# Patient Record
Sex: Female | Born: 1975 | Race: Black or African American | Hispanic: No | Marital: Married | Smoking: Current some day smoker
Health system: Southern US, Community
[De-identification: ages and names within clinical notes are randomized; demographics above are authoritative.]

## PROBLEM LIST (undated history)

## (undated) DIAGNOSIS — F39 Unspecified mood [affective] disorder: Secondary | ICD-10-CM

## (undated) DIAGNOSIS — I10 Essential (primary) hypertension: Secondary | ICD-10-CM

## (undated) DIAGNOSIS — F32A Depression, unspecified: Secondary | ICD-10-CM

## (undated) DIAGNOSIS — F329 Major depressive disorder, single episode, unspecified: Secondary | ICD-10-CM

## (undated) DIAGNOSIS — S7290XA Unspecified fracture of unspecified femur, initial encounter for closed fracture: Secondary | ICD-10-CM

## (undated) DIAGNOSIS — G43909 Migraine, unspecified, not intractable, without status migrainosus: Secondary | ICD-10-CM

## (undated) HISTORY — PX: CHOLECYSTECTOMY: SHX55

## (undated) HISTORY — PX: APPENDECTOMY: SHX54

## (undated) HISTORY — PX: TUBAL LIGATION: SHX77

---

## 2004-09-07 DIAGNOSIS — S7290XA Unspecified fracture of unspecified femur, initial encounter for closed fracture: Secondary | ICD-10-CM | POA: Insufficient documentation

## 2004-09-07 HISTORY — DX: Unspecified fracture of unspecified femur, initial encounter for closed fracture: S72.90XA

## 2004-09-07 HISTORY — PX: FEMUR FRACTURE SURGERY: SHX633

## 2013-02-28 DIAGNOSIS — F39 Unspecified mood [affective] disorder: Secondary | ICD-10-CM | POA: Diagnosis not present

## 2013-03-09 DIAGNOSIS — I10 Essential (primary) hypertension: Secondary | ICD-10-CM | POA: Diagnosis not present

## 2013-03-09 DIAGNOSIS — Z01419 Encounter for gynecological examination (general) (routine) without abnormal findings: Secondary | ICD-10-CM | POA: Diagnosis not present

## 2013-03-09 DIAGNOSIS — F319 Bipolar disorder, unspecified: Secondary | ICD-10-CM | POA: Diagnosis not present

## 2013-03-09 DIAGNOSIS — Z0000 Encounter for general adult medical examination without abnormal findings: Secondary | ICD-10-CM | POA: Diagnosis not present

## 2013-03-09 DIAGNOSIS — M79609 Pain in unspecified limb: Secondary | ICD-10-CM | POA: Diagnosis not present

## 2013-04-05 DIAGNOSIS — Z5181 Encounter for therapeutic drug level monitoring: Secondary | ICD-10-CM | POA: Diagnosis not present

## 2013-04-05 DIAGNOSIS — M79609 Pain in unspecified limb: Secondary | ICD-10-CM | POA: Diagnosis not present

## 2013-04-05 DIAGNOSIS — M545 Low back pain: Secondary | ICD-10-CM | POA: Diagnosis not present

## 2013-04-12 DIAGNOSIS — F329 Major depressive disorder, single episode, unspecified: Secondary | ICD-10-CM | POA: Diagnosis not present

## 2013-04-12 DIAGNOSIS — L259 Unspecified contact dermatitis, unspecified cause: Secondary | ICD-10-CM | POA: Diagnosis not present

## 2013-04-12 DIAGNOSIS — R630 Anorexia: Secondary | ICD-10-CM | POA: Diagnosis not present

## 2013-05-04 DIAGNOSIS — M545 Low back pain: Secondary | ICD-10-CM | POA: Diagnosis not present

## 2013-05-04 DIAGNOSIS — Z5181 Encounter for therapeutic drug level monitoring: Secondary | ICD-10-CM | POA: Diagnosis not present

## 2013-07-12 DIAGNOSIS — I10 Essential (primary) hypertension: Secondary | ICD-10-CM | POA: Diagnosis not present

## 2013-07-12 DIAGNOSIS — B373 Candidiasis of vulva and vagina: Secondary | ICD-10-CM | POA: Diagnosis not present

## 2013-07-12 DIAGNOSIS — N390 Urinary tract infection, site not specified: Secondary | ICD-10-CM | POA: Diagnosis not present

## 2013-07-12 DIAGNOSIS — N3000 Acute cystitis without hematuria: Secondary | ICD-10-CM | POA: Diagnosis not present

## 2013-09-13 DIAGNOSIS — F411 Generalized anxiety disorder: Secondary | ICD-10-CM | POA: Diagnosis not present

## 2013-09-13 DIAGNOSIS — R0789 Other chest pain: Secondary | ICD-10-CM | POA: Diagnosis not present

## 2013-09-13 DIAGNOSIS — I208 Other forms of angina pectoris: Secondary | ICD-10-CM | POA: Diagnosis not present

## 2013-09-13 DIAGNOSIS — R079 Chest pain, unspecified: Secondary | ICD-10-CM | POA: Diagnosis not present

## 2013-09-13 DIAGNOSIS — R0602 Shortness of breath: Secondary | ICD-10-CM | POA: Diagnosis not present

## 2013-10-16 DIAGNOSIS — F17200 Nicotine dependence, unspecified, uncomplicated: Secondary | ICD-10-CM | POA: Diagnosis not present

## 2013-10-16 DIAGNOSIS — G43809 Other migraine, not intractable, without status migrainosus: Secondary | ICD-10-CM | POA: Diagnosis not present

## 2013-10-16 DIAGNOSIS — R0989 Other specified symptoms and signs involving the circulatory and respiratory systems: Secondary | ICD-10-CM | POA: Diagnosis not present

## 2013-10-16 DIAGNOSIS — I10 Essential (primary) hypertension: Secondary | ICD-10-CM | POA: Diagnosis not present

## 2013-10-16 DIAGNOSIS — R0609 Other forms of dyspnea: Secondary | ICD-10-CM | POA: Diagnosis not present

## 2013-10-16 DIAGNOSIS — E559 Vitamin D deficiency, unspecified: Secondary | ICD-10-CM | POA: Diagnosis not present

## 2013-10-16 DIAGNOSIS — F341 Dysthymic disorder: Secondary | ICD-10-CM | POA: Diagnosis not present

## 2013-10-17 DIAGNOSIS — F4310 Post-traumatic stress disorder, unspecified: Secondary | ICD-10-CM | POA: Diagnosis not present

## 2013-10-26 DIAGNOSIS — G4710 Hypersomnia, unspecified: Secondary | ICD-10-CM | POA: Diagnosis not present

## 2013-10-26 DIAGNOSIS — G479 Sleep disorder, unspecified: Secondary | ICD-10-CM | POA: Diagnosis not present

## 2013-10-27 DIAGNOSIS — F17200 Nicotine dependence, unspecified, uncomplicated: Secondary | ICD-10-CM | POA: Diagnosis not present

## 2013-10-27 DIAGNOSIS — I10 Essential (primary) hypertension: Secondary | ICD-10-CM | POA: Diagnosis not present

## 2013-10-27 DIAGNOSIS — E876 Hypokalemia: Secondary | ICD-10-CM | POA: Diagnosis not present

## 2013-10-27 DIAGNOSIS — W1809XA Striking against other object with subsequent fall, initial encounter: Secondary | ICD-10-CM | POA: Diagnosis not present

## 2013-10-27 DIAGNOSIS — S46909A Unspecified injury of unspecified muscle, fascia and tendon at shoulder and upper arm level, unspecified arm, initial encounter: Secondary | ICD-10-CM | POA: Diagnosis not present

## 2013-10-27 DIAGNOSIS — F341 Dysthymic disorder: Secondary | ICD-10-CM | POA: Diagnosis not present

## 2013-10-27 DIAGNOSIS — S4980XA Other specified injuries of shoulder and upper arm, unspecified arm, initial encounter: Secondary | ICD-10-CM | POA: Diagnosis not present

## 2013-10-27 DIAGNOSIS — G43809 Other migraine, not intractable, without status migrainosus: Secondary | ICD-10-CM | POA: Diagnosis not present

## 2013-10-27 DIAGNOSIS — M25519 Pain in unspecified shoulder: Secondary | ICD-10-CM | POA: Diagnosis not present

## 2013-11-06 DIAGNOSIS — F341 Dysthymic disorder: Secondary | ICD-10-CM | POA: Diagnosis not present

## 2013-11-06 DIAGNOSIS — R0609 Other forms of dyspnea: Secondary | ICD-10-CM | POA: Diagnosis not present

## 2013-11-06 DIAGNOSIS — M25519 Pain in unspecified shoulder: Secondary | ICD-10-CM | POA: Diagnosis not present

## 2013-11-06 DIAGNOSIS — E876 Hypokalemia: Secondary | ICD-10-CM | POA: Diagnosis not present

## 2013-11-06 DIAGNOSIS — F17200 Nicotine dependence, unspecified, uncomplicated: Secondary | ICD-10-CM | POA: Diagnosis not present

## 2013-11-06 DIAGNOSIS — G43809 Other migraine, not intractable, without status migrainosus: Secondary | ICD-10-CM | POA: Diagnosis not present

## 2013-11-06 DIAGNOSIS — I10 Essential (primary) hypertension: Secondary | ICD-10-CM | POA: Diagnosis not present

## 2013-11-07 DIAGNOSIS — F4310 Post-traumatic stress disorder, unspecified: Secondary | ICD-10-CM | POA: Diagnosis not present

## 2013-11-11 DIAGNOSIS — F4310 Post-traumatic stress disorder, unspecified: Secondary | ICD-10-CM | POA: Diagnosis not present

## 2013-11-20 DIAGNOSIS — F4310 Post-traumatic stress disorder, unspecified: Secondary | ICD-10-CM | POA: Diagnosis not present

## 2013-11-27 DIAGNOSIS — F4310 Post-traumatic stress disorder, unspecified: Secondary | ICD-10-CM | POA: Diagnosis not present

## 2013-12-04 DIAGNOSIS — F4310 Post-traumatic stress disorder, unspecified: Secondary | ICD-10-CM | POA: Diagnosis not present

## 2013-12-15 DIAGNOSIS — F17200 Nicotine dependence, unspecified, uncomplicated: Secondary | ICD-10-CM | POA: Diagnosis not present

## 2013-12-15 DIAGNOSIS — R0609 Other forms of dyspnea: Secondary | ICD-10-CM | POA: Diagnosis not present

## 2013-12-15 DIAGNOSIS — R0989 Other specified symptoms and signs involving the circulatory and respiratory systems: Secondary | ICD-10-CM | POA: Diagnosis not present

## 2013-12-15 DIAGNOSIS — M25519 Pain in unspecified shoulder: Secondary | ICD-10-CM | POA: Diagnosis not present

## 2013-12-15 DIAGNOSIS — J3089 Other allergic rhinitis: Secondary | ICD-10-CM | POA: Diagnosis not present

## 2013-12-15 DIAGNOSIS — G43809 Other migraine, not intractable, without status migrainosus: Secondary | ICD-10-CM | POA: Diagnosis not present

## 2013-12-15 DIAGNOSIS — F341 Dysthymic disorder: Secondary | ICD-10-CM | POA: Diagnosis not present

## 2013-12-15 DIAGNOSIS — I10 Essential (primary) hypertension: Secondary | ICD-10-CM | POA: Diagnosis not present

## 2013-12-15 DIAGNOSIS — E876 Hypokalemia: Secondary | ICD-10-CM | POA: Diagnosis not present

## 2013-12-22 DIAGNOSIS — F4310 Post-traumatic stress disorder, unspecified: Secondary | ICD-10-CM | POA: Diagnosis not present

## 2014-01-12 DIAGNOSIS — M545 Low back pain, unspecified: Secondary | ICD-10-CM | POA: Diagnosis not present

## 2014-01-12 DIAGNOSIS — M25519 Pain in unspecified shoulder: Secondary | ICD-10-CM | POA: Diagnosis not present

## 2014-01-12 DIAGNOSIS — F17200 Nicotine dependence, unspecified, uncomplicated: Secondary | ICD-10-CM | POA: Diagnosis not present

## 2014-01-12 DIAGNOSIS — M549 Dorsalgia, unspecified: Secondary | ICD-10-CM | POA: Diagnosis not present

## 2014-01-12 DIAGNOSIS — S20219A Contusion of unspecified front wall of thorax, initial encounter: Secondary | ICD-10-CM | POA: Diagnosis not present

## 2014-01-12 DIAGNOSIS — S298XXA Other specified injuries of thorax, initial encounter: Secondary | ICD-10-CM | POA: Diagnosis not present

## 2014-01-12 DIAGNOSIS — I10 Essential (primary) hypertension: Secondary | ICD-10-CM | POA: Diagnosis not present

## 2014-01-25 DIAGNOSIS — F4310 Post-traumatic stress disorder, unspecified: Secondary | ICD-10-CM | POA: Diagnosis not present

## 2014-02-01 DIAGNOSIS — F4310 Post-traumatic stress disorder, unspecified: Secondary | ICD-10-CM | POA: Diagnosis not present

## 2014-04-30 DIAGNOSIS — S335XXA Sprain of ligaments of lumbar spine, initial encounter: Secondary | ICD-10-CM | POA: Diagnosis not present

## 2014-04-30 DIAGNOSIS — M549 Dorsalgia, unspecified: Secondary | ICD-10-CM | POA: Diagnosis not present

## 2014-07-31 DIAGNOSIS — R7309 Other abnormal glucose: Secondary | ICD-10-CM | POA: Diagnosis not present

## 2014-07-31 DIAGNOSIS — N390 Urinary tract infection, site not specified: Secondary | ICD-10-CM | POA: Diagnosis not present

## 2014-07-31 DIAGNOSIS — F418 Other specified anxiety disorders: Secondary | ICD-10-CM | POA: Diagnosis not present

## 2014-07-31 DIAGNOSIS — G43809 Other migraine, not intractable, without status migrainosus: Secondary | ICD-10-CM | POA: Diagnosis not present

## 2014-07-31 DIAGNOSIS — E559 Vitamin D deficiency, unspecified: Secondary | ICD-10-CM | POA: Diagnosis not present

## 2014-07-31 DIAGNOSIS — Z720 Tobacco use: Secondary | ICD-10-CM | POA: Diagnosis not present

## 2014-07-31 DIAGNOSIS — I10 Essential (primary) hypertension: Secondary | ICD-10-CM | POA: Diagnosis not present

## 2014-07-31 DIAGNOSIS — J309 Allergic rhinitis, unspecified: Secondary | ICD-10-CM | POA: Diagnosis not present

## 2014-11-18 DIAGNOSIS — R093 Abnormal sputum: Secondary | ICD-10-CM | POA: Diagnosis not present

## 2014-11-18 DIAGNOSIS — J019 Acute sinusitis, unspecified: Secondary | ICD-10-CM | POA: Diagnosis not present

## 2014-11-18 DIAGNOSIS — F17200 Nicotine dependence, unspecified, uncomplicated: Secondary | ICD-10-CM | POA: Diagnosis not present

## 2014-11-18 DIAGNOSIS — R0989 Other specified symptoms and signs involving the circulatory and respiratory systems: Secondary | ICD-10-CM | POA: Diagnosis not present

## 2014-11-18 DIAGNOSIS — R05 Cough: Secondary | ICD-10-CM | POA: Diagnosis not present

## 2014-11-19 DIAGNOSIS — G43809 Other migraine, not intractable, without status migrainosus: Secondary | ICD-10-CM | POA: Diagnosis not present

## 2014-11-19 DIAGNOSIS — Z720 Tobacco use: Secondary | ICD-10-CM | POA: Diagnosis not present

## 2014-11-19 DIAGNOSIS — J309 Allergic rhinitis, unspecified: Secondary | ICD-10-CM | POA: Diagnosis not present

## 2014-11-19 DIAGNOSIS — F418 Other specified anxiety disorders: Secondary | ICD-10-CM | POA: Diagnosis not present

## 2014-11-19 DIAGNOSIS — I10 Essential (primary) hypertension: Secondary | ICD-10-CM | POA: Diagnosis not present

## 2014-11-19 DIAGNOSIS — J209 Acute bronchitis, unspecified: Secondary | ICD-10-CM | POA: Diagnosis not present

## 2015-02-19 DIAGNOSIS — Z1389 Encounter for screening for other disorder: Secondary | ICD-10-CM | POA: Diagnosis not present

## 2015-02-19 DIAGNOSIS — Z131 Encounter for screening for diabetes mellitus: Secondary | ICD-10-CM | POA: Diagnosis not present

## 2015-02-19 DIAGNOSIS — F418 Other specified anxiety disorders: Secondary | ICD-10-CM | POA: Diagnosis not present

## 2015-02-19 DIAGNOSIS — G43809 Other migraine, not intractable, without status migrainosus: Secondary | ICD-10-CM | POA: Diagnosis not present

## 2015-02-19 DIAGNOSIS — Z0000 Encounter for general adult medical examination without abnormal findings: Secondary | ICD-10-CM | POA: Diagnosis not present

## 2015-02-19 DIAGNOSIS — I10 Essential (primary) hypertension: Secondary | ICD-10-CM | POA: Diagnosis not present

## 2015-02-19 DIAGNOSIS — Z113 Encounter for screening for infections with a predominantly sexual mode of transmission: Secondary | ICD-10-CM | POA: Diagnosis not present

## 2015-02-19 DIAGNOSIS — Z01118 Encounter for examination of ears and hearing with other abnormal findings: Secondary | ICD-10-CM | POA: Diagnosis not present

## 2015-02-19 DIAGNOSIS — E559 Vitamin D deficiency, unspecified: Secondary | ICD-10-CM | POA: Diagnosis not present

## 2015-02-19 DIAGNOSIS — Z136 Encounter for screening for cardiovascular disorders: Secondary | ICD-10-CM | POA: Diagnosis not present

## 2015-02-19 DIAGNOSIS — J309 Allergic rhinitis, unspecified: Secondary | ICD-10-CM | POA: Diagnosis not present

## 2015-02-19 DIAGNOSIS — Z720 Tobacco use: Secondary | ICD-10-CM | POA: Diagnosis not present

## 2015-02-19 DIAGNOSIS — Z0100 Encounter for examination of eyes and vision without abnormal findings: Secondary | ICD-10-CM | POA: Diagnosis not present

## 2015-02-28 DIAGNOSIS — R51 Headache: Secondary | ICD-10-CM | POA: Diagnosis not present

## 2015-02-28 DIAGNOSIS — R531 Weakness: Secondary | ICD-10-CM | POA: Diagnosis not present

## 2015-02-28 DIAGNOSIS — R110 Nausea: Secondary | ICD-10-CM | POA: Diagnosis not present

## 2015-06-19 DIAGNOSIS — Z79899 Other long term (current) drug therapy: Secondary | ICD-10-CM | POA: Diagnosis not present

## 2015-06-19 DIAGNOSIS — Z885 Allergy status to narcotic agent status: Secondary | ICD-10-CM | POA: Diagnosis not present

## 2015-06-19 DIAGNOSIS — R112 Nausea with vomiting, unspecified: Secondary | ICD-10-CM | POA: Diagnosis not present

## 2015-06-19 DIAGNOSIS — R51 Headache: Secondary | ICD-10-CM | POA: Diagnosis not present

## 2015-09-19 DIAGNOSIS — Z79899 Other long term (current) drug therapy: Secondary | ICD-10-CM | POA: Diagnosis not present

## 2015-09-19 DIAGNOSIS — R51 Headache: Secondary | ICD-10-CM | POA: Diagnosis not present

## 2015-09-20 ENCOUNTER — Encounter (HOSPITAL_BASED_OUTPATIENT_CLINIC_OR_DEPARTMENT_OTHER): Payer: Self-pay | Admitting: Emergency Medicine

## 2015-09-20 ENCOUNTER — Emergency Department (HOSPITAL_BASED_OUTPATIENT_CLINIC_OR_DEPARTMENT_OTHER)
Admission: EM | Admit: 2015-09-20 | Discharge: 2015-09-20 | Disposition: A | Discharge status: Home / Self Care | Payer: Medicare Other | Attending: Emergency Medicine | Admitting: Emergency Medicine

## 2015-09-20 DIAGNOSIS — R51 Headache: Secondary | ICD-10-CM | POA: Diagnosis not present

## 2015-09-20 DIAGNOSIS — Z8659 Personal history of other mental and behavioral disorders: Secondary | ICD-10-CM | POA: Diagnosis not present

## 2015-09-20 DIAGNOSIS — R42 Dizziness and giddiness: Secondary | ICD-10-CM | POA: Diagnosis not present

## 2015-09-20 DIAGNOSIS — Z87891 Personal history of nicotine dependence: Secondary | ICD-10-CM | POA: Diagnosis not present

## 2015-09-20 DIAGNOSIS — I10 Essential (primary) hypertension: Secondary | ICD-10-CM | POA: Insufficient documentation

## 2015-09-20 DIAGNOSIS — R519 Headache, unspecified: Secondary | ICD-10-CM

## 2015-09-20 HISTORY — DX: Depression, unspecified: F32.A

## 2015-09-20 HISTORY — DX: Migraine, unspecified, not intractable, without status migrainosus: G43.909

## 2015-09-20 HISTORY — DX: Essential (primary) hypertension: I10

## 2015-09-20 HISTORY — DX: Major depressive disorder, single episode, unspecified: F32.9

## 2015-09-20 HISTORY — DX: Unspecified mood (affective) disorder: F39

## 2015-09-20 MED ORDER — SODIUM CHLORIDE 0.9 % IV BOLUS (SEPSIS)
1000.0000 mL | Freq: Once | INTRAVENOUS | Status: AC
  Administered 2015-09-20: 1000 mL via INTRAVENOUS

## 2015-09-20 MED ORDER — KETOROLAC TROMETHAMINE 30 MG/ML IJ SOLN
30.0000 mg | Freq: Once | INTRAMUSCULAR | Status: AC
  Administered 2015-09-20: 30 mg via INTRAVENOUS
  Filled 2015-09-20: qty 1

## 2015-09-20 MED ORDER — METOCLOPRAMIDE HCL 10 MG PO TABS: 10.0000 mg | ORAL_TABLET | Freq: Four times a day (QID) | ORAL | Status: DC | PRN

## 2015-09-20 MED ORDER — DIPHENHYDRAMINE HCL 50 MG/ML IJ SOLN
25.0000 mg | Freq: Once | INTRAMUSCULAR | Status: AC
  Administered 2015-09-20: 25 mg via INTRAVENOUS
  Filled 2015-09-20: qty 1

## 2015-09-20 MED ORDER — METOCLOPRAMIDE HCL 5 MG/ML IJ SOLN
10.0000 mg | Freq: Once | INTRAMUSCULAR | Status: AC
Start: 2015-09-20 — End: 2015-09-20
  Administered 2015-09-20: 10 mg via INTRAVENOUS
  Filled 2015-09-20: qty 2

## 2015-09-20 NOTE — ED Provider Notes (Signed)
CSN: [id number]     Arrival date & time 09/20/15  0154 History   First MD Initiated Contact with Patient 09/20/15 0444     Chief Complaint  Patient presents with  . Headache     (Consider location/radiation/quality/duration/timing/severity/associated sxs/prior Treatment) HPI  This is a 40 year old female with a history of migraines. She is here with a migraine that began yesterday morning. It was mild at first but progressed throughout the day becoming severe yesterday afternoon. It began occipitally and moved frontally which is the usual pattern for her migraines. The headache was associated with nausea, vomiting and photophobia. She was seen at [facility] where she was given 3 injections. She states one was a steroid but she does not know what the others were. Her pain was relieved and she was discharged home about 7 PM. After returning home her headache recurred and she has had multiple episodes of vomiting since. She called EMS this morning and was given Zofran IV PTA with improvement in her nausea but continues to have a headache. She rates her pain as about a 6 out of 10 now. She is also having lightheadedness with standing.  She states her migraines tend to occur about the time of her menses and her current period began yesterday.  Past Medical History  Diagnosis Date  . Hypertension   . Mood disorder (HCC)   . Migraine   . Depression    Past Surgical History  Procedure Laterality Date  . Appendectomy    . Cholecystectomy    . Tubal ligation     History reviewed. No pertinent family history. Social History  Substance Use Topics  . Smoking status: Former Games developermoker  . Smokeless tobacco: Never Used  . Alcohol Use: No   OB History    No data available     Review of Systems  All other systems reviewed and are negative.   Allergies  Review of patient's allergies indicates no known allergies.  Home Medications   Prior to Admission medications   Not on  File   BP 149/88 mmHg  Pulse 80  Temp(Src) 98.1 F (36.7 C) (Oral)  Resp 18  Ht 4\' 10"  (1.473 m)  Wt 140 lb (63.504 kg)  BMI 29.27 kg/m2  SpO2 100%   Physical Exam  General: Well-developed, well-nourished female in no acute distress; appearance consistent with age of record HENT: normocephalic; atraumatic Eyes: pupils equal, round and reactive to light; extraocular muscles intact; photophobia Neck: supple Heart: regular rate and rhythm Lungs: clear to auscultation bilaterally Abdomen: soft; nondistended; nontender; bowel sounds present Extremities: No deformity; full range of motion; pulses normal Neurologic: Awake, alert and oriented; motor function intact in all extremities and symmetric; no facial droop; normal coordination and speech Skin: Warm and dry Psychiatric: Normal mood and affect    ED Course  Procedures (including critical care time)   MDM  5:51 AM Headache improved, able to drink fluids without emesis. States she is ready to go home.  Paula LibraJohn Envy Meno, MD 09/20/15 [phone]

## 2015-09-20 NOTE — ED Notes (Addendum)
Pt [facility]  denies abd pain but admits to emesis

## 2015-09-20 NOTE — ED Notes (Signed)
[facility] headache resolved upon discharge from there, but then pt had several episodes of vomiting.  Pt received 4mg  zofran en route via EMS

## 2015-09-20 NOTE — ED Notes (Signed)
Pt verbalizes understanding of d/c instructions and denies any further needs at this time. 

## 2015-09-20 NOTE — ED Notes (Signed)
Pt ambulated to restroom and is sipping on PO fluids

## 2015-09-24 DIAGNOSIS — H01004 Unspecified blepharitis left upper eyelid: Secondary | ICD-10-CM | POA: Diagnosis not present

## 2015-09-24 DIAGNOSIS — Z83511 Family history of glaucoma: Secondary | ICD-10-CM | POA: Diagnosis not present

## 2015-09-24 DIAGNOSIS — H04123 Dry eye syndrome of bilateral lacrimal glands: Secondary | ICD-10-CM | POA: Diagnosis not present

## 2015-09-24 DIAGNOSIS — H5213 Myopia, bilateral: Secondary | ICD-10-CM | POA: Diagnosis not present

## 2015-09-24 DIAGNOSIS — H01001 Unspecified blepharitis right upper eyelid: Secondary | ICD-10-CM | POA: Diagnosis not present

## 2015-09-24 DIAGNOSIS — H43393 Other vitreous opacities, bilateral: Secondary | ICD-10-CM | POA: Diagnosis not present

## 2015-09-26 DIAGNOSIS — G43809 Other migraine, not intractable, without status migrainosus: Secondary | ICD-10-CM | POA: Diagnosis not present

## 2015-09-26 DIAGNOSIS — G43909 Migraine, unspecified, not intractable, without status migrainosus: Secondary | ICD-10-CM | POA: Diagnosis not present

## 2015-09-26 DIAGNOSIS — F418 Other specified anxiety disorders: Secondary | ICD-10-CM | POA: Diagnosis not present

## 2015-09-26 DIAGNOSIS — J309 Allergic rhinitis, unspecified: Secondary | ICD-10-CM | POA: Diagnosis not present

## 2015-09-26 DIAGNOSIS — I10 Essential (primary) hypertension: Secondary | ICD-10-CM | POA: Diagnosis not present

## 2015-09-26 DIAGNOSIS — Z720 Tobacco use: Secondary | ICD-10-CM | POA: Diagnosis not present

## 2015-12-01 ENCOUNTER — Encounter (HOSPITAL_BASED_OUTPATIENT_CLINIC_OR_DEPARTMENT_OTHER): Payer: Self-pay | Admitting: *Deleted

## 2015-12-01 ENCOUNTER — Emergency Department (HOSPITAL_BASED_OUTPATIENT_CLINIC_OR_DEPARTMENT_OTHER)
Admission: EM | Admit: 2015-12-01 | Discharge: 2015-12-01 | Disposition: A | Discharge status: Home / Self Care | Payer: Medicare Other | Attending: Emergency Medicine | Admitting: Emergency Medicine

## 2015-12-01 DIAGNOSIS — Z8659 Personal history of other mental and behavioral disorders: Secondary | ICD-10-CM | POA: Insufficient documentation

## 2015-12-01 DIAGNOSIS — G8929 Other chronic pain: Secondary | ICD-10-CM | POA: Insufficient documentation

## 2015-12-01 DIAGNOSIS — M545 Low back pain, unspecified: Secondary | ICD-10-CM

## 2015-12-01 DIAGNOSIS — G43909 Migraine, unspecified, not intractable, without status migrainosus: Secondary | ICD-10-CM | POA: Insufficient documentation

## 2015-12-01 DIAGNOSIS — I10 Essential (primary) hypertension: Secondary | ICD-10-CM | POA: Insufficient documentation

## 2015-12-01 DIAGNOSIS — Z87891 Personal history of nicotine dependence: Secondary | ICD-10-CM | POA: Insufficient documentation

## 2015-12-01 MED ORDER — KETOROLAC TROMETHAMINE 60 MG/2ML IM SOLN
60.0000 mg | Freq: Once | INTRAMUSCULAR | Status: AC
  Administered 2015-12-01: 60 mg via INTRAMUSCULAR
  Filled 2015-12-01: qty 2

## 2015-12-01 MED ORDER — ACETAMINOPHEN 500 MG PO TABS
1000.0000 mg | ORAL_TABLET | Freq: Once | ORAL | Status: AC
Start: 2015-12-01 — End: 2015-12-01
  Administered 2015-12-01: 1000 mg via ORAL
  Filled 2015-12-01: qty 2

## 2015-12-01 MED ORDER — OXYCODONE HCL 5 MG PO TABS
5.0000 mg | ORAL_TABLET | Freq: Once | ORAL | Status: AC
  Administered 2015-12-01: 5 mg via ORAL
  Filled 2015-12-01: qty 1

## 2015-12-01 MED ORDER — DIAZEPAM 5 MG PO TABS
5.0000 mg | ORAL_TABLET | Freq: Once | ORAL | Status: AC
  Administered 2015-12-01: 5 mg via ORAL
  Filled 2015-12-01: qty 1

## 2015-12-01 NOTE — Discharge Instructions (Signed)
Follow with her family doctor and see if it when she gets physical therapy. Anti-inflammatory medicines are the best for this. Nothing else is proven to be beneficial.   Take 4 over the counter ibuprofen tablets 3 times a day or 2 over-the-counter naproxen tablets twice a day for pain.  Back Pain, Adult Back pain is very common in adults.The cause of back pain is rarely dangerous and the pain often gets better over time.The cause of your back pain may not be known. Some common causes of back pain include:  Strain of the muscles or ligaments supporting the spine.  Wear and tear (degeneration) of the spinal disks.  Arthritis.  Direct injury to the back. For many people, back pain may return. Since back pain is rarely dangerous, most people can learn to manage this condition on their own. HOME CARE INSTRUCTIONS Watch your back pain for any changes. The following actions may help to lessen any discomfort you are feeling:  Remain active. It is stressful on your back to sit or stand in one place for long periods of time. Do not sit, drive, or stand in one place for more than 30 minutes at a time. Take short walks on even surfaces as soon as you are able.Try to increase the length of time you walk each day.  Exercise regularly as directed by your health care provider. Exercise helps your back heal faster. It also helps avoid future injury by keeping your muscles strong and flexible.  Do not stay in bed.Resting more than 1-2 days can delay your recovery.  Pay attention to your body when you bend and lift. The most comfortable positions are those that put less stress on your recovering back. Always use proper lifting techniques, including:  Bending your knees.  Keeping the load close to your body.  Avoiding twisting.  Find a comfortable position to sleep. Use a firm mattress and lie on your side with your knees slightly bent. If you lie on your back, put a pillow under your knees.  Avoid  feeling anxious or stressed.Stress increases muscle tension and can worsen back pain.It is important to recognize when you are anxious or stressed and learn ways to manage it, such as with exercise.  Take medicines only as directed by your health care provider. Over-the-counter medicines to reduce pain and inflammation are often the most helpful.Your health care provider may prescribe muscle relaxant drugs.These medicines help dull your pain so you can more quickly return to your normal activities and healthy exercise.  Apply ice to the injured area:  Put ice in a plastic bag.  Place a towel between your skin and the bag.  Leave the ice on for 20 minutes, 2-3 times a day for the first 2-3 days. After that, ice and heat may be alternated to reduce pain and spasms.  Maintain a healthy weight. Excess weight puts extra stress on your back and makes it difficult to maintain good posture. SEEK MEDICAL CARE IF:  You have pain that is not relieved with rest or medicine.  You have increasing pain going down into the legs or buttocks.  You have pain that does not improve in one week.  You have night pain.  You lose weight.  You have a fever or chills. SEEK IMMEDIATE MEDICAL CARE IF:   You develop new bowel or bladder control problems.  You have unusual weakness or numbness in your arms or legs.  You develop nausea or vomiting.  You develop abdominal pain.  You feel faint.   This information is not intended to replace advice given to you by your health care provider. Make sure you discuss any questions you have with your health care provider.   Document Released: 08/24/2005 Document Revised: 09/14/2014 Document Reviewed: 12/26/2013 Elsevier Interactive Patient Education [employer].

## 2015-12-01 NOTE — ED Provider Notes (Signed)
CSN: [id number]     Arrival date & time 12/01/15  1001 History   First MD Initiated Contact with Patient 12/01/15 1012     Chief Complaint  Patient presents with  . Back Pain     (Consider location/radiation/quality/duration/timing/severity/associated sxs/prior Treatment) Patient is a 40 y.o. female presenting with back pain. The history is provided by the patient.  Back Pain Location:  Lumbar spine Quality:  Aching Radiates to:  Does not radiate Pain severity:  Moderate Onset quality:  Gradual Duration:  3 days Timing:  Constant Progression:  Worsening Chronicity:  New Relieved by:  Nothing Worsened by:  Sitting, touching, twisting and movement Ineffective treatments:  None tried Associated symptoms: no chest pain, no dysuria, no fever and no headaches    39 yoF With a chief complaint of low back pain. This been going on for about 3 or 4 days. 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 She has a steel rod in there and it causes her pain usually when the weather changes. Says when that happens she has to walk somewhat differently. Has been limping and that is been causing her pain to her lower back. Has never had pain in her lower back is severe. Has tried Advil without relief. Denies loss of bowel or bladder denies loss of perirectal sensation. Denies leg weakness.  Past Medical History  Diagnosis Date  . Hypertension   . Mood disorder (HCC)   . Migraine   . Depression    Past Surgical History  Procedure Laterality Date  . Appendectomy    . Cholecystectomy    . Tubal ligation     No family history on file. Social History  Substance Use Topics  . Smoking status: Former Games developermoker  . Smokeless tobacco: Never Used  . Alcohol Use: No   OB History    No data available     Review of Systems  Constitutional: Negative for fever and chills.  HENT: Negative for congestion and rhinorrhea.   Eyes: Negative for redness  and visual disturbance.  Respiratory: Negative for shortness of breath and wheezing.   Cardiovascular: Negative for chest pain and palpitations.  Gastrointestinal: Negative for nausea and vomiting.  Genitourinary: Negative for dysuria and urgency.  Musculoskeletal: Positive for back pain. Negative for myalgias and arthralgias.  Skin: Negative for pallor and wound.  Neurological: Negative for dizziness and headaches.      Allergies  Review of patient's allergies indicates no known allergies.  Home Medications   Prior to Admission medications   Medication Sig Start Date End Date Taking? Authorizing Provider  metoCLOPramide (REGLAN) 10 MG tablet Take 1 tablet (10 mg total) by mouth every 6 (six) hours as needed for nausea or vomiting. 09/20/15   John Molpus, MD   BP 158/102 mmHg  Pulse 80  Temp(Src) 97.8 F (36.6 C) (Oral)  Resp 18  Ht 4\' 10"  (1.473 m)  Wt 145 lb (65.772 kg)  BMI 30.31 kg/m2  SpO2 100% Physical Exam  Constitutional: She is oriented to person, place, and time. She appears well-developed and well-nourished. No distress.  HENT:  Head: Normocephalic and atraumatic.  Eyes: EOM are normal. Pupils are equal, round, and reactive to light.  Neck: Normal range of motion. Neck supple.  Cardiovascular: Normal rate and regular rhythm.  Exam reveals no gallop and no friction rub.   No murmur heard. Pulmonary/Chest: Effort normal. She has no wheezes. She has no rales.  Abdominal: Soft. She exhibits no distension. There is no tenderness.  Musculoskeletal: She exhibits tenderness. She exhibits no edema.  Tender palpation paraspinally about L4 and 5. Pulse motor and sensation intact distally.  Neurological: She is alert and oriented to person, place, and time.  Skin: Skin is warm and dry. She is not diaphoretic.  Psychiatric: She has a normal mood and affect. Her behavior is normal.  Nursing note and vitals reviewed.   ED Course  Procedures (including critical care  time) Labs Review Labs Reviewed - No data to display  Imaging Review No results found. I have personally reviewed and evaluated these images and lab results as part of my medical decision-making.   EKG Interpretation None      MDM   Final diagnoses:  Midline low back pain without sciatica    40 yo F with low back pain. Likely cause to her favoring her right leg. We'll treat her symptomatically in the ED. Course of NSAIDs PCP follow-up.  10:47 AM:  I have discussed the diagnosis/risks/treatment options with the patient and family and believe the pt to be eligible for discharge home to follow-up with PCP. We also discussed returning to the ED immediately if new or worsening sx occur. We discussed the sx which are most concerning (e.g., sudden worsening pain, fever, inability to tolerate by mouth) that necessitate immediate return. Medications administered to the patient during their visit and any new prescriptions provided to the patient are listed below.  Medications given during this visit Medications  ketorolac (TORADOL) injection 60 mg (not administered)  oxyCODONE (Oxy IR/ROXICODONE) immediate release tablet 5 mg (not administered)  diazepam (VALIUM) tablet 5 mg (not administered)  acetaminophen (TYLENOL) tablet 1,000 mg (not administered)    New Prescriptions   No medications on file    The patient appears reasonably screen and/or stabilized for discharge and I doubt any other medical condition or other [facility] requiring further screening, evaluation, or treatment in the ED at this time prior to discharge.      Melene Plan, DO 12/01/15 1047

## 2015-12-01 NOTE — ED Notes (Signed)
Patient c/o R side lower back pain that shoots won R leg. Patient states she has a rod in her R leg that aggravates  her back at times

## 2015-12-03 ENCOUNTER — Telehealth: Payer: Self-pay

## 2015-12-03 NOTE — Telephone Encounter (Signed)
Pre visit call completed 

## 2015-12-04 ENCOUNTER — Encounter: Payer: Self-pay | Admitting: Medical

## 2015-12-04 ENCOUNTER — Ambulatory Visit (HOSPITAL_BASED_OUTPATIENT_CLINIC_OR_DEPARTMENT_OTHER)
Admission: RE | Admit: 2015-12-04 | Discharge: 2015-12-04 | Disposition: A | Discharge status: Home / Self Care | Payer: Medicare Other | Source: Ambulatory Visit | Attending: Medical | Admitting: Medical

## 2015-12-04 ENCOUNTER — Other Ambulatory Visit: Payer: Self-pay | Admitting: Medical

## 2015-12-04 ENCOUNTER — Ambulatory Visit (INDEPENDENT_AMBULATORY_CARE_PROVIDER_SITE_OTHER): Payer: Medicare Other | Admitting: Medical

## 2015-12-04 VITALS — BP 126/84 | HR 73 | Temp 98.3°F | Resp 16 | Ht <= 58 in | Wt 158.0 lb

## 2015-12-04 DIAGNOSIS — G43C0 Periodic headache syndromes in child or adult, not intractable: Secondary | ICD-10-CM | POA: Diagnosis not present

## 2015-12-04 DIAGNOSIS — M25551 Pain in right hip: Secondary | ICD-10-CM | POA: Insufficient documentation

## 2015-12-04 DIAGNOSIS — M545 Low back pain: Secondary | ICD-10-CM | POA: Diagnosis not present

## 2015-12-04 DIAGNOSIS — M79651 Pain in right thigh: Secondary | ICD-10-CM | POA: Diagnosis not present

## 2015-12-04 MED ORDER — TRAMADOL HCL 50 MG PO TABS: 50.0000 mg | ORAL_TABLET | Freq: Three times a day (TID) | ORAL | Status: DC | PRN

## 2015-12-04 MED ORDER — DICLOFENAC SODIUM 75 MG PO TBEC: 75.0000 mg | DELAYED_RELEASE_TABLET | Freq: Two times a day (BID) | ORAL | Status: DC

## 2015-12-04 MED ORDER — BUTALBITAL-APAP-CAFFEINE 50-300-40 MG PO CAPS: ORAL_CAPSULE | ORAL | Status: DC

## 2015-12-04 NOTE — Patient Instructions (Addendum)
For your areas of pain. Rx diclofenac. Stop advil . Rx tramadol for breakthrough pain.   Rest and back stretches.  Red flag signs and symptoms reviewed that would indicate ED referal.  Refill of butalbital. Advised to use sparingly.  Follow up in 10-14 days or as needed

## 2015-12-04 NOTE — Progress Notes (Signed)
Pre visit review using our clinic review tool, if applicable. No additional management support is needed unless otherwise documented below in the visit note. 

## 2015-12-04 NOTE — Progress Notes (Signed)
Subjective:    Patient ID: Phyllis Adams, female    DOB: 1976-02-06, 40 y.o.   MRN: [id number]  HPI  I have reviewed pt PMH, PSH, FH, Social History and Surgical History  Pt has disabitlity related to migraines, and leg pain.  Pt in for back pain and some pain in her rt thigh. Pt has rod in in her rt upper thigh. She had old rt femur surgery and orif. Surgery on rt thigh was done in 2006. Pt states that her thigh started to hurt about a week ago. She was limping a little bit. Then started to notice some low back pain. Pt thinks change in gait effected her back. Back pain is the worse than thigh pain. When she changes positions pain will be worse. Pt has some pain that radiates from thigh toward her back. Rt thigh feels faint numb. No saddle anesthesia. No incontinence. No leg weakness.  Since 2006 after thigh surgery she felt ok. Although early on after surgery she thought her bone may have shifted.  Pt has history of htn- but she took her self of medication. When she has migraines or in pain. But she states has been controlled.  Migraine ha history- no ha presently. Previously aborted with fiorcet. This works most of the time. Pt states gets light and sound sensitive when get ha. Pt had scan of her head 6 yrs ago. At that time study was negative.  LMP- yesterday.  Review of Systems  Constitutional: Negative for fever, chills and fatigue.  Respiratory: Negative for cough, chest tightness and wheezing.   Cardiovascular: Negative for chest pain and palpitations.  Gastrointestinal: Negative for nausea, vomiting and abdominal pain.  Genitourinary: Negative for dysuria, urgency, hematuria and flank pain.  Musculoskeletal: Positive for back pain.       Rt thigh pain.  Skin: Negative for rash.  Neurological: Positive for numbness. Negative for dizziness, seizures, speech difficulty, weakness and headaches.       Rt thigh numbness at times.  Psychiatric/Behavioral: Negative for behavioral  problems, confusion and dysphoric mood.    Past Medical History  Diagnosis Date  . Hypertension   . Mood disorder (HCC)   . Migraine   . Depression     Social History   Social History  . Marital Status: Single    Spouse Name: N/A  . Number of Children: N/A  . Years of Education: N/A   Occupational History  . Not on file.   Social History Main Topics  . Smoking status: Former Games developer  . Smokeless tobacco: Never Used  . Alcohol Use: No  . Drug Use: No  . Sexual Activity: Yes    Birth Control/ Protection: Other-see comments   Other Topics Concern  . Not on file   Social History Narrative    Past Surgical History  Procedure Laterality Date  . Appendectomy    . Cholecystectomy    . Tubal ligation      No family history on file.  No Known Allergies  Current Outpatient Prescriptions on File Prior to Visit  Medication Sig Dispense Refill  . metoCLOPramide (REGLAN) 10 MG tablet Take 1 tablet (10 mg total) by mouth every 6 (six) hours as needed for nausea or vomiting. 10 tablet 0   No current facility-administered medications on file prior to visit.    BP 126/84 mmHg  Pulse 73  Temp(Src) 98.3 F (36.8 C) (Oral)  Resp 16  Ht  (1.473 m)  Wt 158 lb (71.668  kg)  BMI 33.03 kg/m2  SpO2 100%  LMP 12/03/2015       Objective:   Physical Exam  General Appearance- Not in acute distress.    Chest and Lung Exam Auscultation: Breath sounds:-Normal. Clear even and unlabored. Adventitious sounds:- No Adventitious sounds.  Cardiovascular Auscultation:Rythm - Regular, rate and rythm. Heart Sounds -Normal heart sounds.  Abdomen Inspection:-Inspection Normal.  Palpation/Perucssion: Palpation and Percussion of the abdomen reveal- Non Tender, No Rebound tenderness, No rigidity(Guarding) and No Palpable abdominal masses.  Liver:-Normal.  Spleen:- Normal.   Back Mid lumbar spine tenderness to palpation. Pain on straight leg lift. Rt side. Pain on lateral  movements and flexion/extension of the spine.(obviosu pain on movement0  Lower ext neurologic  L5-S1 sensation intact bilaterally. Normal patellar reflexes bilaterally. No foot drop bilaterally.  Rt thigh- mild pain on palpation. Rt hip- old scar. Faint tender to palpation and rom.      Assessment & Plan:  For your areas of pain. Rx diclofenac. Stop advil. Rx tramadol for breakthrough pain.   Rest and back stretches.  Red flag signs and symptoms reviewed that would indicate ED referal.  Refill of butalbital. Advised to use sparingly.  Follow up in 10-14 days or as needed

## 2015-12-17 ENCOUNTER — Encounter: Payer: Self-pay | Admitting: Medical

## 2015-12-17 ENCOUNTER — Ambulatory Visit (INDEPENDENT_AMBULATORY_CARE_PROVIDER_SITE_OTHER): Payer: Medicare Other | Admitting: Medical

## 2015-12-17 VITALS — BP 118/78 | HR 88 | Temp 98.1°F | Ht <= 58 in | Wt 154.8 lb

## 2015-12-17 DIAGNOSIS — M792 Neuralgia and neuritis, unspecified: Secondary | ICD-10-CM | POA: Diagnosis not present

## 2015-12-17 DIAGNOSIS — M545 Low back pain: Secondary | ICD-10-CM | POA: Diagnosis not present

## 2015-12-17 DIAGNOSIS — M5410 Radiculopathy, site unspecified: Secondary | ICD-10-CM

## 2015-12-17 MED ORDER — DICLOFENAC SODIUM 75 MG PO TBEC: 75.0000 mg | DELAYED_RELEASE_TABLET | Freq: Two times a day (BID) | ORAL | Status: DC

## 2015-12-17 MED ORDER — TRAMADOL HCL 50 MG PO TABS: 50.0000 mg | ORAL_TABLET | Freq: Three times a day (TID) | ORAL | Status: DC | PRN

## 2015-12-17 NOTE — Progress Notes (Signed)
Subjective:    Patient ID: Phyllis Adams, female    DOB: 05/02/1976, 40 y.o.   MRN: [id number]  HPI   Pt still having pain in her back and some pain in her rt femur region. Her xray of her back was normal. She has pain in her lower back, buttock and some pain at times to her rt thigh as well as medial aspect.   Pain is worse when she sits down for long periods.  Pt has nail in her rt femur form prior ortho surgery.  Pt points to her lower lumbar and sacral area. Pain radiates to rt si area. And towards her rt thigh area.  This pain has been going on for years. But recently pain has worsened again past month.   Pt was seeing pain MD in the past. She took herself off of med 3 years ago.   Sometimes feels numbness rt thigh area and mid thigh.  Pt states in past pain medication makes her too drowsy. So she usually just takes it at night.  Tramadol makes her sleep. Other meds in past made her too drowsy.   LMP- 2  weeks ago.    Review of Systems  Constitutional: Negative for fever, chills and fatigue.  Respiratory: Negative for cough, chest tightness and wheezing.   Cardiovascular: Negative for chest pain and palpitations.  Gastrointestinal: Negative for nausea, vomiting and abdominal pain.  Genitourinary: Negative for dysuria, urgency, hematuria and flank pain.  Musculoskeletal: Positive for myalgias and back pain.  Neurological: Negative for weakness and numbness.       Some pain radiate from lumbar spine to rt thigh. Some numbness at times to rt thigh.    Past Medical History  Diagnosis Date  . Hypertension   . Mood disorder (HCC)   . Migraine   . Depression     Social History   Social History  . Marital Status: Legally Separated    Spouse Name: N/A  . Number of Children: N/A  . Years of Education: N/A   Occupational History  . Not on file.   Social History Main Topics  . Smoking status: Former Games developermoker  . Smokeless tobacco: Never Used  . Alcohol Use: No    . Drug Use: No  . Sexual Activity: Yes    Birth Control/ Protection: Other-see comments   Other Topics Concern  . Not on file   Social History Narrative    Past Surgical History  Procedure Laterality Date  . Appendectomy    . Cholecystectomy    . Tubal ligation      No family history on file.  No Known Allergies  Current Outpatient Prescriptions on File Prior to Visit  Medication Sig Dispense Refill  . butalbital-acetaminophen-caffeine (FIORICET, ESGIC) 50-325-40 MG tablet Take by mouth.    . Butalbital-APAP-Caffeine (FIORICET) 50-300-40 MG CAPS 1 tab po q 8 hrs as needed HA 30 capsule 0  . diclofenac (VOLTAREN) 75 MG EC tablet Take 1 tablet (75 mg total) by mouth 2 (two) times daily. 30 tablet 0  . metoCLOPramide (REGLAN) 10 MG tablet Take 1 tablet (10 mg total) by mouth every 6 (six) hours as needed for nausea or vomiting. 10 tablet 0  . traMADol (ULTRAM) 50 MG tablet Take 1 tablet (50 mg total) by mouth every 8 (eight) hours as needed. 30 tablet 0   No current facility-administered medications on file prior to visit.    BP 118/78 mmHg  Pulse 88  Temp(Src) 98.1 F (36.7  C) (Oral)  Ht  (1.473 m)  Wt 154 lb 12.8 oz (70.217 kg)  BMI 32.36 kg/m2  SpO2 98%  LMP 12/03/2015       Objective:   Physical Exam   General Appearance- Not in acute distress.    Chest and Lung Exam Auscultation: Breath sounds:-Normal. Clear even and unlabored. Adventitious sounds:- No Adventitious sounds.  Cardiovascular Auscultation:Rythm - Regular, rate and rythm. Heart Sounds -Normal heart sounds.  Abdomen Inspection:-Inspection Normal.  Palpation/Perucssion: Palpation and Percussion of the abdomen reveal- Non Tender, No Rebound tenderness, No rigidity(Guarding) and No Palpable abdominal masses.  Liver:-Normal.  Spleen:- Normal.   Back Mid lumbar and spine tenderness to palpation. Rt SI area.  Pain on straight leg lift. Pain on lateral movements and flexion/extension  of the spine.  Lower ext neurologic  L5-S1 sensation intact bilaterally. Normal patellar reflexes bilaterally. No foot drop bilaterally.       Assessment & Plan:  For your back pain I want you to continue the diclofenac and tramadol.  I have referred you to both pain management and neurosurgery.  If you pain worsens before those appointments then notify us. If severe symptoms then ED evaluation.  Follow up in 2 weeks. I want to do bp check up and start to address health maintenance. Ask for 30 minute appointment.

## 2015-12-17 NOTE — Patient Instructions (Addendum)
For your back pain I want you to continue the diclofenac and tramadol.  I have referred you to both pain management and neurosurgery.  If you pain worsens before those appointments then notify us. If severe symptoms then ED evaluation.  Follow up in 2 weeks. I want to do bp check up and start to address health maintenance. Ask for 30 minute appointment.

## 2015-12-17 NOTE — Progress Notes (Signed)
Pre visit review using our clinic review tool, if applicable. No additional management support is needed unless otherwise documented below in the visit note. 

## 2015-12-25 DIAGNOSIS — Z8781 Personal history of (healed) traumatic fracture: Secondary | ICD-10-CM | POA: Diagnosis not present

## 2015-12-25 DIAGNOSIS — M79606 Pain in leg, unspecified: Secondary | ICD-10-CM | POA: Diagnosis not present

## 2015-12-25 DIAGNOSIS — Z79899 Other long term (current) drug therapy: Secondary | ICD-10-CM | POA: Diagnosis not present

## 2015-12-25 DIAGNOSIS — G8929 Other chronic pain: Secondary | ICD-10-CM | POA: Diagnosis not present

## 2015-12-25 DIAGNOSIS — M47816 Spondylosis without myelopathy or radiculopathy, lumbar region: Secondary | ICD-10-CM | POA: Diagnosis not present

## 2015-12-26 ENCOUNTER — Telehealth: Payer: Self-pay | Admitting: Medical

## 2015-12-26 NOTE — Telephone Encounter (Signed)
Did you see my note about her orthopedic surgery history.

## 2015-12-26 NOTE — Telephone Encounter (Signed)
[facility] WANTS YOU TO ORDER AND HAVE AN MRI DONE BEFORE THEY CONSIDER SCHEDULING THE PATIENT

## 2015-12-26 NOTE — Telephone Encounter (Signed)
Pt has history of orthopedic surgery. Nail/metalin her rt femur. So I thought they would be best they evaluate her symptoms. Maybe get emg. Maybe they would rely on CT. Had it not been for nail in thigh history would have ordered mri. Pass this message to them please and let me know what they say. Thanks.

## 2015-12-26 NOTE — Telephone Encounter (Signed)
How should this be handled? Im I suppose to contact patient or [facility]? [facility] will not see this patient without MRI. Please advise

## 2015-12-28 NOTE — Telephone Encounter (Signed)
Pt needs neurosurgery referral. They won't see her unless she has had mri. Will you ask pt who did her orthopedic surgery(have pt sign form so we can get those records)But also when she signs form explain to them we want to know what type of rod or nail was used in femur. What type of metal. Radiology probably want to know this before doing the mri.

## 2015-12-31 NOTE — Telephone Encounter (Signed)
Pt has an appointment on 01/03/16 with pcp and pt can sign the form when she comes in for her appointment.

## 2016-01-03 ENCOUNTER — Encounter: Payer: Medicare Other | Admitting: Medical

## 2016-01-03 ENCOUNTER — Ambulatory Visit (INDEPENDENT_AMBULATORY_CARE_PROVIDER_SITE_OTHER): Payer: Medicare Other | Admitting: Medical

## 2016-01-03 ENCOUNTER — Encounter: Payer: Self-pay | Admitting: Medical

## 2016-01-03 VITALS — BP 120/82 | HR 87 | Temp 98.1°F | Ht <= 58 in | Wt 153.6 lb

## 2016-01-03 DIAGNOSIS — M545 Low back pain: Secondary | ICD-10-CM

## 2016-01-03 DIAGNOSIS — M25551 Pain in right hip: Secondary | ICD-10-CM

## 2016-01-03 MED ORDER — KETOROLAC TROMETHAMINE 60 MG/2ML IM SOLN
60.0000 mg | Freq: Once | INTRAMUSCULAR | Status: AC
  Administered 2016-01-03: 60 mg via INTRAMUSCULAR

## 2016-01-03 NOTE — Progress Notes (Signed)
Subjective:    Patient ID: Phyllis Adams, female    DOB: 1976/08/18, 40 y.o.   MRN: [id number]  HPI  Pt in with some back pain. Moderate to severe today. I did refer her to pain management. They gave her lyrica. Pt is going to neurosurgeon. Pt has some hardware in rt thigh. Initially neurosurgeon would not see her since I did not order her mri. But I did not order due to the rod. Pt thinks name of surgeon may have been Dr. Thamas Jaegers. Orthopedist at [facility] [address]. Surgery was in 2006.  Pain management may have referred her to neurologist for EMG.  Pt is going to see pain medicine doctor Jan 22, 2016.  Pt has no incontinence, no leg weakness and no numbness.  Pt has some response to lyrica. But makes her drowsy.  Some back to rt thigh.    Review of Systems  Constitutional: Negative for fever, chills and fatigue.  Respiratory: Negative for cough, shortness of breath and wheezing.   Cardiovascular: Negative for chest pain and palpitations.  Musculoskeletal: Positive for back pain.       Lower back and rt thigh pain.  Skin: Negative for rash.  Neurological: Negative for dizziness, speech difficulty, weakness, light-headedness and numbness.  Hematological: Negative for adenopathy. Does not bruise/bleed easily.  Psychiatric/Behavioral: Negative for behavioral problems and confusion.    Past Medical History  Diagnosis Date  . Hypertension   . Mood disorder (HCC)   . Migraine   . Depression      Social History   Social History  . Marital Status: Legally Separated    Spouse Name: N/A  . Number of Children: N/A  . Years of Education: N/A   Occupational History  . Not on file.   Social History Main Topics  . Smoking status: Former Games developer  . Smokeless tobacco: Never Used  . Alcohol Use: No  . Drug Use: No  . Sexual Activity: Yes    Birth Control/ Protection: Other-see comments   Other Topics Concern  . Not on file   Social History Narrative     Past Surgical History  Procedure Laterality Date  . Appendectomy    . Cholecystectomy    . Tubal ligation      No family history on file.  No Known Allergies  Current Outpatient Prescriptions on File Prior to Visit  Medication Sig Dispense Refill  . butalbital-acetaminophen-caffeine (FIORICET, ESGIC) 50-325-40 MG tablet Take by mouth.    . Butalbital-APAP-Caffeine (FIORICET) 50-300-40 MG CAPS 1 tab po q 8 hrs as needed HA 30 capsule 0  . diclofenac (VOLTAREN) 75 MG EC tablet Take 1 tablet (75 mg total) by mouth 2 (two) times daily. 30 tablet 0  . metoCLOPramide (REGLAN) 10 MG tablet Take 1 tablet (10 mg total) by mouth every 6 (six) hours as needed for nausea or vomiting. 10 tablet 0  . traMADol (ULTRAM) 50 MG tablet Take 1 tablet (50 mg total) by mouth every 8 (eight) hours as needed. 30 tablet 0   No current facility-administered medications on file prior to visit.    BP 120/82 mmHg  Pulse 87  Temp(Src) 98.1 F (36.7 C) (Oral)  Ht  (1.473 m)  Wt 153 lb 9.6 oz (69.673 kg)  BMI 32.11 kg/m2  SpO2 100%  LMP 12/03/2015       Objective:   Physical Exam  General Appearance- Not in acute distress.    Chest and Lung Exam Auscultation: Breath  sounds:-Normal. Clear even and unlabored. Adventitious sounds:- No Adventitious sounds.  Cardiovascular Auscultation:Rythm - Regular, rate and rythm. Heart Sounds -Normal heart sounds.  Abdomen Inspection:-Inspection Normal.  Palpation/Perucssion: Palpation and Percussion of the abdomen reveal- Non Tender, No Rebound tenderness, No rigidity(Guarding) and No Palpable abdominal masses.  Liver:-Normal.  Spleen:- Normal.   Back Mid lumbar spine tenderness to palpation. Tender rt si area. Pain on straight leg lift. Pain on lateral movements and flexion/extension of the spine.  Lower ext neurologic  L5-S1 sensation intact bilaterally. Normal patellar reflexes bilaterally. No foot drop bilaterally.       Assessment & Plan:  For your back pain and leg pain today will give toradol 60 mg im. Then i want you to  call pain management on Monday and let them know how you are feeling. They may increase the lyrica dosing.   I will refer you back to the ortho clinic where you originally had rt thigh surgery.   Attend neurologist that pain management referred you to . Sound like you may get emg.   Follow up in 2 weeks or as needed.  I will try to investigate since pt has rod in her rt femur if her only option is CT of lumbar as opposed to mri.

## 2016-01-03 NOTE — Progress Notes (Signed)
Pre visit review using our clinic review tool, if applicable. No additional management support is needed unless otherwise documented below in the visit note. 

## 2016-01-03 NOTE — Patient Instructions (Signed)
For your back pain and leg pain today will give toradol 60 mg im. Then i want you to  call pain management on Monday and let them know how you are feeling. They may increase the lyrica dosing.   I will refer you back to the ortho clinic where you originally had rt thigh surgery.   Attend neurologist that pain management referred you to . Sound like you may get emg.   Follow up in 2 weeks or as needed.

## 2016-01-03 NOTE — Addendum Note (Signed)
Addended by: Neldon LabellaMABE, Shiquita Collignon S on: 01/03/2016 04:12 PM   Modules accepted: Orders

## 2016-01-03 NOTE — Progress Notes (Signed)
This encounter was created in error - please disregard.

## 2016-01-06 ENCOUNTER — Emergency Department (HOSPITAL_BASED_OUTPATIENT_CLINIC_OR_DEPARTMENT_OTHER): Payer: Medicare Other

## 2016-01-06 ENCOUNTER — Emergency Department (HOSPITAL_BASED_OUTPATIENT_CLINIC_OR_DEPARTMENT_OTHER)
Admission: EM | Admit: 2016-01-06 | Discharge: 2016-01-06 | Disposition: A | Discharge status: Home / Self Care | Payer: Medicare Other | Attending: Emergency Medicine | Admitting: Emergency Medicine

## 2016-01-06 ENCOUNTER — Encounter (HOSPITAL_BASED_OUTPATIENT_CLINIC_OR_DEPARTMENT_OTHER): Payer: Self-pay | Admitting: *Deleted

## 2016-01-06 DIAGNOSIS — F329 Major depressive disorder, single episode, unspecified: Secondary | ICD-10-CM | POA: Insufficient documentation

## 2016-01-06 DIAGNOSIS — M5441 Lumbago with sciatica, right side: Secondary | ICD-10-CM | POA: Diagnosis not present

## 2016-01-06 DIAGNOSIS — I10 Essential (primary) hypertension: Secondary | ICD-10-CM | POA: Diagnosis not present

## 2016-01-06 DIAGNOSIS — M5440 Lumbago with sciatica, unspecified side: Secondary | ICD-10-CM | POA: Insufficient documentation

## 2016-01-06 DIAGNOSIS — Z87891 Personal history of nicotine dependence: Secondary | ICD-10-CM | POA: Insufficient documentation

## 2016-01-06 DIAGNOSIS — M545 Low back pain: Secondary | ICD-10-CM | POA: Diagnosis not present

## 2016-01-06 LAB — URINE MICROSCOPIC-ADD ON

## 2016-01-06 LAB — URINALYSIS, ROUTINE W REFLEX MICROSCOPIC
BILIRUBIN URINE: NEGATIVE
Glucose, UA: NEGATIVE mg/dL
KETONES UR: NEGATIVE mg/dL
Leukocytes, UA: NEGATIVE
NITRITE: NEGATIVE
PH: 7 (ref 5.0–8.0)
Protein, ur: NEGATIVE mg/dL
Specific Gravity, Urine: 1.016 (ref 1.005–1.030)

## 2016-01-06 LAB — PREGNANCY, URINE: Preg Test, Ur: NEGATIVE

## 2016-01-06 MED ORDER — PREDNISONE 20 MG PO TABS: 40.0000 mg | ORAL_TABLET | Freq: Every day | ORAL | Status: DC

## 2016-01-06 MED ORDER — KETOROLAC TROMETHAMINE 30 MG/ML IJ SOLN
30.0000 mg | Freq: Once | INTRAMUSCULAR | Status: AC
  Administered 2016-01-06: 30 mg via INTRAMUSCULAR
  Filled 2016-01-06: qty 1

## 2016-01-06 NOTE — Discharge Instructions (Signed)
Chronic Back Pain ° When back pain lasts longer than 3 months, it is called chronic back pain. People with chronic back pain often go through certain periods that are more intense (flare-ups).  °CAUSES °Chronic back pain can be caused by wear and tear (degeneration) on different structures in your back. These structures include: °· The bones of your spine (vertebrae) and the joints surrounding your spinal cord and nerve roots (facets). °· The strong, fibrous tissues that connect your vertebrae (ligaments). °Degeneration of these structures may result in pressure on your nerves. This can lead to constant pain. °HOME CARE INSTRUCTIONS °· Avoid bending, heavy lifting, prolonged sitting, and activities which make the problem worse. °· Take brief periods of rest throughout the day to reduce your pain. Lying down or standing usually is better than sitting while you are resting. °· Take over-the-counter or prescription medicines only as directed by your caregiver. °SEEK IMMEDIATE MEDICAL CARE IF:  °· You have weakness or numbness in one of your legs or feet. °· You have trouble controlling your bladder or bowels. °· You have nausea, vomiting, abdominal pain, shortness of breath, or fainting. °  °This information is not intended to replace advice given to you by your health care provider. Make sure you discuss any questions you have with your health care provider. °  °Document Released: 10/01/2004 Document Revised: 11/16/2011 Document Reviewed: 02/11/2015 °Elsevier Interactive Patient Education ©2016 Elsevier Inc. ° °Radicular Pain °Radicular pain in either the arm or leg is usually from a bulging or herniated disk in the spine. A piece of the herniated disk may press against the nerves as the nerves exit the spine. This causes pain which is felt at the tips of the nerves down the arm or leg. Other causes of radicular pain may include: °· Fractures. °· Heart disease. °· Cancer. °· An abnormal and usually degenerative state  of the nervous system or nerves (neuropathy). °Diagnosis may require CT or MRI scanning to determine the primary cause.  °Nerves that start at the neck (nerve roots) may cause radicular pain in the outer shoulder and arm. It can spread down to the thumb and fingers. The symptoms vary depending on which nerve root has been affected. In most cases radicular pain improves with conservative treatment. Neck problems may require physical therapy, a neck collar, or cervical traction. Treatment may take many weeks, and surgery may be considered if the symptoms do not improve.  °Conservative treatment is also recommended for sciatica. Sciatica causes pain to radiate from the lower back or buttock area down the leg into the foot. Often there is a history of back problems. Most patients with sciatica are better after 2 to 4 weeks of rest and other supportive care. Short term bed rest can reduce the disk pressure considerably. Sitting, however, is not a good position since this increases the pressure on the disk. You should avoid bending, lifting, and all other activities which make the problem worse. Traction can be used in severe cases. Surgery is usually reserved for patients who do not improve within the first months of treatment. °Only take over-the-counter or prescription medicines for pain, discomfort, or fever as directed by your caregiver. Narcotics and muscle relaxants may help by relieving more severe pain and spasm and by providing mild sedation. Cold or massage can give significant relief. Spinal manipulation is not recommended. It can increase the degree of disc protrusion. Epidural steroid injections are often effective treatment for radicular pain. These injections deliver medicine to the spinal   nerve in the space between the protective covering of the spinal cord and back bones (vertebrae). Your caregiver can give you more information about steroid injections. These injections are most effective when given  within two weeks of the onset of pain.  °You should see your caregiver for follow up care as recommended. A program for neck and back injury rehabilitation with stretching and strengthening exercises is an important part of management.  °SEEK IMMEDIATE MEDICAL CARE IF: °· You develop increased pain, weakness, or numbness in your arm or leg. °· You develop difficulty with bladder or bowel control. °· You develop abdominal pain. °  °This information is not intended to replace advice given to you by your health care provider. Make sure you discuss any questions you have with your health care provider. °  °Document Released: 10/01/2004 Document Revised: 09/14/2014 Document Reviewed: 03/20/2015 °Elsevier Interactive Patient Education ©2016 Elsevier Inc. ° °

## 2016-01-06 NOTE — ED Notes (Signed)
EDP at bedside  

## 2016-01-06 NOTE — ED Notes (Signed)
Pt c/o back pain x 3 months , Seen by pain management , hx chronic pain

## 2016-01-06 NOTE — ED Provider Notes (Signed)
CSN: [id number]     Arrival date & time 01/06/16  1949 History   First MD Initiated Contact with Patient 01/06/16 2111     Chief Complaint  Patient presents with  . Back Pain     (Consider location/radiation/quality/duration/timing/severity/associated sxs/prior Treatment) HPI Comments: Patient presents to the emergency department with chief complaint of low back pain 3 months. She states that she has radiating pain into the right leg. She denies any bladder incontinence. She states that she has follow[place]up with [place] specialist in [place] the middle of May. She is followed by pain management. She does not report any associated fevers. Her symptoms are worsened with movement and palpation.  The history is provided by the patient. No language interpreter was used.    Past Medical History  Diagnosis Date  . Hypertension   . Mood disorder (HCC)   . Migraine   . Depression    Past Surgical History  Procedure Laterality Date  . Appendectomy    . Cholecystectomy    . Tubal ligation     History reviewed. No pertinent family history. Social History  Substance Use Topics  . Smoking status: Former Games developermoker  . Smokeless tobacco: Never Used  . Alcohol Use: No   OB History    No data available     Review of Systems  Constitutional: Negative for fever and chills.  Gastrointestinal:       No bowel incontinence  Genitourinary:       No urinary incontinence  Musculoskeletal: Positive for myalgias, back pain and arthralgias.  Neurological:       No saddle anesthesia      Allergies  Review of patient's allergies indicates no known allergies.  Home Medications   Prior to Admission medications   Medication Sig Start Date End Date Taking? Authorizing Provider  butalbital-acetaminophen-caffeine (FIORICET, [facility]) [phone] MG tablet Take by mouth. 06/19/15   Historical Provider, MD  Butalbital-APAP-Caffeine (FIORICET) 50-300-40 MG CAPS 1 tab po q 8 hrs as needed HA 12/04/15   Esperanza RichtersEdward  [name], PA-C  diclofenac (VOLTAREN) 75 MG EC tablet Take 1 tablet (75 mg total) by mouth 2 (two) times daily. 12/17/15   [name] [name], PA-C  metoCLOPramide (REGLAN) 10 MG tablet Take 1 tablet (10 mg total) by mouth every 6 (six) hours as needed for nausea or vomiting. 09/20/15   John Molpus, MD  predniSONE (DELTASONE) 20 MG tablet Take 2 tablets (40 mg total) by mouth daily. 01/06/16   Roxy Horsemanobert Winnifred Dufford, PA-C  pregabalin (LYRICA) 75 MG capsule Take 75 mg by mouth 2 (two) times daily.    Historical Provider, MD  traMADol (ULTRAM) 50 MG tablet Take 1 tablet (50 mg total) by mouth every 8 (eight) hours as needed. 12/17/15   Edward [name], PA-C   BP 181/111 mmHg  Pulse 92  Temp(Src) 99 F (37.2 C) (Oral)  Resp 16  Ht 4\' 10"  (1.473 m)  Wt 68.04 kg  BMI 31.36 kg/m2  SpO2 100%  LMP 01/02/2016 Physical Exam  Constitutional: She is oriented to person, place, and time. She appears well-developed and well-nourished. No distress.  HENT:  Head: Normocephalic and atraumatic.  Eyes: Conjunctivae and EOM are normal. Right eye exhibits no discharge. Left eye exhibits no discharge. No scleral icterus.  Neck: Normal range of motion. Neck supple. No tracheal deviation present.  Cardiovascular: Normal rate, regular rhythm and normal heart sounds.  Exam reveals no gallop and no friction rub.   No murmur heard. Pulmonary/Chest: Effort normal and breath sounds  normal. No respiratory distress. She has no wheezes.  Abdominal: Soft. She exhibits no distension. There is no tenderness.  Musculoskeletal: Normal range of motion.  Lumbar paraspinal muscles tender to palpation, no bony tenderness, step-offs, or gross abnormality or deformity of spine, patient is able to ambulate, moves all extremities  Bilateral great toe extension intact Bilateral plantar/dorsiflexion intact  Neurological: She is alert and oriented to person, place, and time. She has normal reflexes.  Sensation and strength intact  bilaterally Symmetrical reflexes  Skin: Skin is warm. She is not diaphoretic.  Psychiatric: She has a normal mood and affect. Her behavior is normal. Judgment and thought content normal.  Nursing note and vitals reviewed.   ED Course  Procedures (including critical care time) Labs Review Labs Reviewed  URINALYSIS, ROUTINE W REFLEX MICROSCOPIC (NOT AT [facility]) - Abnormal; Notable for the following:    APPearance CLOUDY (*)    Hgb urine dipstick LARGE (*)    All other components within normal limits  URINE MICROSCOPIC-ADD ON - Abnormal; Notable for the following:    Squamous Epithelial / LPF 0-5 (*)    Bacteria, UA MANY (*)    All other components within normal limits  PREGNANCY, URINE    Imaging Review Ct Lumbar Spine Wo Contrast  01/06/2016  CLINICAL DATA:  Initial evaluation for 3 month history of low back pain with right leg numbness. EXAM: CT LUMBAR SPINE WITHOUT CONTRAST TECHNIQUE: Multidetector CT imaging of the lumbar spine was performed without intravenous contrast administration. Multiplanar CT image reconstructions were also generated. COMPARISON:  Prior radiograph from 12/04/2015. FINDINGS: For the purposes of this dictation, the lowest well-formed intervertebral disc spaces presumed to be the L5-S1 level, and there presumed to be 5 lumbar type vertebral bodies. Vertebral bodies are normally aligned with preservation of the normal lumbar lordosis paravertebral body heights are well preserved. No acute or chronic fracture. No listhesis or malalignment. Paraspinous soft tissues within normal limits. L1-2:  Negative. L2-3:  Negative. L3-4: Probable mild annular disc bulge without definite focal disc herniation. No significant stenosis. Mild facet arthrosis. L4-5: Diffuse disc bulge, eccentric to the left. Reactive endplate changes. Mild bilateral facet arthrosis, slightly worse on the left. No significant canal stenosis. No frank neural impingement appreciated. Foramina appear grossly  patent. L5-S1: Diffuse disc bulge without definite focal disc herniation. Reactive endplate changes. Mild bilateral facet arthrosis. No significant canal or foraminal stenosis. IMPRESSION: 1. No acute abnormality within the lumbar spine. 2. 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 No significant stenosis identified on this limited noncontrast CT. Further evaluation with dedicated MRI would likely be helpful for complete evaluation. 3. 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 Electronically Signed   By: Rise Mu M.D.   On: 01/06/2016 22:52   I have personally reviewed and evaluated these images and lab results as part of my medical decision-making.   EKG Interpretation None      MDM   Final diagnoses:  Right-sided low back pain with sciatica, sciatica laterality unspecified    Patient with back pain.  No neurological deficits and normal neuro exam.  Patient is ambulatory.  No loss of bowel or bladder control.  Doubt cauda equina.  Denies fever,  doubt epidural abscess or other lesion. Recommend back exercises, stretching, RICE, and will treat with a short course of prednisone.  Encouraged the patient that there could be a need for additional workup and/or imaging such as MRI, if the  symptoms do not resolve. Patient advised that if the back pain does not resolve, or radiates, this could progress to more serious conditions and is encouraged to follow-up with PCP or orthopedics within 2 weeks.       Daun Rens BrRoxy Horseman 01/06/16 [id number]  Vanetta Mulders, MD 01/13/16 [phone]

## 2016-01-08 ENCOUNTER — Telehealth: Payer: Self-pay | Admitting: Medical

## 2016-01-08 ENCOUNTER — Encounter: Payer: Self-pay | Admitting: Medical

## 2016-01-08 ENCOUNTER — Ambulatory Visit (INDEPENDENT_AMBULATORY_CARE_PROVIDER_SITE_OTHER): Payer: Medicare Other | Admitting: Medical

## 2016-01-08 VITALS — BP 120/80 | HR 77 | Temp 98.6°F | Ht <= 58 in | Wt 156.0 lb

## 2016-01-08 DIAGNOSIS — M25551 Pain in right hip: Secondary | ICD-10-CM | POA: Diagnosis not present

## 2016-01-08 DIAGNOSIS — M545 Low back pain: Secondary | ICD-10-CM

## 2016-01-08 MED ORDER — KETOROLAC TROMETHAMINE 60 MG/2ML IM SOLN
60.0000 mg | Freq: Once | INTRAMUSCULAR | Status: AC
  Administered 2016-01-08: 60 mg via INTRAMUSCULAR

## 2016-01-08 MED ORDER — KETOROLAC TROMETHAMINE 10 MG PO TABS: 10.0000 mg | ORAL_TABLET | Freq: Four times a day (QID) | ORAL | Status: DC | PRN

## 2016-01-08 NOTE — Telephone Encounter (Signed)
In epic it states pt was deactivated on my chart May 3??

## 2016-01-08 NOTE — Telephone Encounter (Signed)
Pt can't afford toradol tablets. So can you give her toradol 60 mg injection today. If not today then tomorrow morning.

## 2016-01-08 NOTE — Addendum Note (Signed)
Addended by: Neldon LabellaMABE, HOLDEN S on: 01/08/2016 05:57 PM   Modules accepted: Orders

## 2016-01-08 NOTE — Progress Notes (Signed)
Pre visit review using our clinic review tool, if applicable. No additional management support is needed unless otherwise documented below in the visit note. 

## 2016-01-08 NOTE — Telephone Encounter (Signed)
Spoke with pt and she wants to come in this evening for an injection of toradol 60 mg.

## 2016-01-08 NOTE — Telephone Encounter (Signed)
Can be reached: [phone] [id number] [place], [place] - [address]  Reason for call: Pt states toradol not covered by insurance and she cannot afford $34 out of pocket. She is wanting to know if there is an alternative. If not can she come in for shot?

## 2016-01-08 NOTE — Telephone Encounter (Signed)
Pt came in on 01/08/16 around 545 pm and got toradol 60 mg injection. Pt wants to know if there is another medication that can be sent to the pharmacy for the pt. Please advise. She would like a message sent to mychart if possible for an alternative medication.

## 2016-01-08 NOTE — Telephone Encounter (Signed)
appt with Frederich Baldingorothy Nivens, PA 5/4 @10am , pt aware

## 2016-01-08 NOTE — Progress Notes (Signed)
Subjective:    Patient ID: Phyllis Adams, female    DOB: [birth year], 40 y.o.   MRN: [id number]  HPI  Patient presents to the emergency department with chief complaint of low back pain 3 months. She states that she has radiating pain into the right leg. She denies any bladder incontinence. She states that she has follow-up with a specialist in [place] the middle of May. She is followed by pain management. She does not report any associated fevers. Her symptoms are worsened with movement and palpation.  The history is provided by the patient. No language interpreter was used. Above hpi from ED.   Below final diagnosis from ED.  Patient with back pain. No neurological deficits and normal neuro exam. Patient is ambulatory. No loss of bowel or bladder control. Doubt cauda equina. Denies fever, doubt epidural abscess or other lesion. Recommend back exercises, stretching, RICE, and will treat with a short course of prednisone.  Encouraged the patient that there could be a need for additional workup and/or imaging such as MRI, if the symptoms do not resolve. Patient advised that if the back pain does not resolve, or radiates, this could progress to more serious conditions and is encouraged to follow-up with PCP or orthopedics within 2 weeks.  Pt lumbar ct showed.  1. No acute abnormality within the lumbar spine. 2. 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 No significant stenosis identified on this limited noncontrast CT. Further evaluation with dedicated MRI would likely be helpful for complete evaluation. 3. 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  Has appointment with pain management on Jan 22, 2016. They would not see her quickly.   Pt states pain got worse quickly after I saw her.   Pt on discharge was told to call pain management. Pt called her pain specialist.   Pt has appointment with  neurologist  on Jan 21, 2016.  Pt states toradol that I gave her seemed to help her a lot the other day. She had 2 days of low pain after toradol 60 mg.   Pt did not respond to diclofenac.   Pt expressed that she does not want pain medicine. Since it puts her to sleep.  Currently no leg weakness, no foot drop, no saddle anesthesia and no foot drop.    Review of Systems  Constitutional: Negative for fever, chills and fatigue.  Respiratory: Negative for cough, chest tightness and wheezing.   Cardiovascular: Negative for chest pain and palpitations.  Gastrointestinal: Negative for nausea, vomiting and abdominal pain.  Genitourinary: Negative for dysuria, urgency, hematuria and flank pain.  Musculoskeletal: Positive for back pain.  Neurological: Negative for weakness and numbness.       Some radicular pain  Hematological: Negative for adenopathy. Does not bruise/bleed easily.    Past Medical History  Diagnosis Date  . Hypertension   . Mood disorder (HCC)   . Migraine   . Depression      Social History   Social History  . Marital Status: Legally Separated    Spouse Name: N/A  . Number of Children: N/A  . Years of Education: N/A   Occupational History  . Not on file.   Social History Main Topics  . Smoking status: Former Games developer  . Smokeless tobacco: Never Used  . Alcohol Use: No  . Drug Use: Yes    Special: Marijuana  . Sexual Activity: Yes    Birth Control/ Protection: [place]  comments, Surgical   Other Topics Concern  . Not on file   Social History Narrative    Past Surgical History  Procedure Laterality Date  . Appendectomy    . Cholecystectomy    . Tubal ligation      No family history on file.  No Known Allergies  Current Outpatient Prescriptions on File Prior to Visit  Medication Sig Dispense Refill  . butalbital-acetaminophen-caffeine (FIORICET, ESGIC) 50-325-40 MG tablet Take by mouth.    . Butalbital-APAP-Caffeine (FIORICET) 50-300-40 MG  CAPS 1 tab po q 8 hrs as needed HA 30 capsule 0  . metoCLOPramide (REGLAN) 10 MG tablet Take 1 tablet (10 mg total) by mouth every 6 (six) hours as needed for nausea or vomiting. 10 tablet 0  . pregabalin (LYRICA) 75 MG capsule Take 75 mg by mouth 2 (two) times daily.    . traMADol (ULTRAM) 50 MG tablet Take 1 tablet (50 mg total) by mouth every 8 (eight) hours as needed. 30 tablet 0   No current facility-administered medications on file prior to visit.    BP 120/80 mmHg  Pulse 77  Temp(Src) 98.6 F (37 C) (Oral)  Ht 4\' 10"  (1.473 m)  Wt 156 lb (70.761 kg)  BMI 32.61 kg/m2  SpO2 98%  LMP 01/02/2016       Objective:   Physical Exam  General Appearance- Not in acute distress.    Chest and Lung Exam Auscultation: Breath sounds:-Normal. Clear even and unlabored. Adventitious sounds:- No Adventitious sounds.  Cardiovascular Auscultation:Rythm - Regular, rate and rythm. Heart Sounds -Normal heart sounds.  Abdomen Inspection:-Inspection Normal.  Palpation/Perucssion: Palpation and Percussion of the abdomen reveal- Non Tender, No Rebound tenderness, No rigidity(Guarding) and No Palpable abdominal masses.  Liver:-Normal.  Spleen:- Normal.   Back Mid lumbar and spine tenderness to palpation. Rt SI area.  Pain on straight leg lift. Pain on lateral movements and flexion/extension of the spine.  Lower ext neurologic  L5-S1 sensation intact bilaterally. Normal patellar reflexes bilaterally. No foot drop bilaterally.      Assessment & Plan:  For your back pain, I have asked staff to contact your pain management office to see if they would see you as soon as possible.  I have asked our staff to contact the neurosurgeon office to see if they will see her.  Also I put in referral to orthopedist. Will try to refer you to same office you have been to before.  I will try to talk with radiology to ask about possible mri in light of your rt thigh rod history.  I did  prescribe you toradol tablets and want you to stop diclofenac.  Follow up in 7-10 days or as needed   Note pt was quite upset and expressed frustration with her continued pain. She is under pain management. We called over today to get her appointment with her pain clinic for tomorrow. We gave her the information.   Shaylea Ucci, [name], PA-C

## 2016-01-08 NOTE — Patient Instructions (Signed)
For your back pain, I have asked staff to contact your pain management office to see if they would see you as soon as possible.  I have asked our staff to contact the neurosurgeon office to see if they will see her.  Also I put in referral to orthopedist. Will try to refer you to same office you have been to before.  I will try to talk with radiology to ask about possible mri in light of your rt thigh rod history.  I did prescribe you toradol tablets and want you to stop diclofenac.  Follow up in 7-10 days or as needed

## 2016-01-08 NOTE — Telephone Encounter (Signed)
Pt has appointment with pain management tomorrow.They can decide to add anti-inflammatory type of medication if they think it is appropiate. Advise pt to inform pain management of how she responds to toradol IM/

## 2016-01-08 NOTE — Telephone Encounter (Signed)
Please advise on note below.   

## 2016-01-08 NOTE — Telephone Encounter (Signed)
Can you call her pain management  office and advise her lyrica is not helping. Pt needs to be seen. Since they are managing her pain I want them to see her. Since I don't want to rx controlled meds.   Also will you call the neurosurgeon office and advise them that patient has had ct of her back. Some bulging disc. Can the see her. I did not order mri. She has rod in her rt femur.  Also see her ortho referral.

## 2016-01-09 DIAGNOSIS — M79606 Pain in leg, unspecified: Secondary | ICD-10-CM | POA: Diagnosis not present

## 2016-01-09 DIAGNOSIS — G8929 Other chronic pain: Secondary | ICD-10-CM | POA: Diagnosis not present

## 2016-01-09 DIAGNOSIS — Z79891 Long term (current) use of opiate analgesic: Secondary | ICD-10-CM | POA: Diagnosis not present

## 2016-01-09 DIAGNOSIS — M47816 Spondylosis without myelopathy or radiculopathy, lumbar region: Secondary | ICD-10-CM | POA: Diagnosis not present

## 2016-01-13 DIAGNOSIS — M533 Sacrococcygeal disorders, not elsewhere classified: Secondary | ICD-10-CM | POA: Diagnosis not present

## 2016-01-13 DIAGNOSIS — M4726 Other spondylosis with radiculopathy, lumbar region: Secondary | ICD-10-CM | POA: Diagnosis not present

## 2016-01-13 DIAGNOSIS — M1288 Other specific arthropathies, not elsewhere classified, other specified site: Secondary | ICD-10-CM | POA: Diagnosis not present

## 2016-01-15 DIAGNOSIS — M5416 Radiculopathy, lumbar region: Secondary | ICD-10-CM | POA: Diagnosis not present

## 2016-01-15 DIAGNOSIS — Z969 Presence of functional implant, unspecified: Secondary | ICD-10-CM | POA: Diagnosis not present

## 2016-01-15 DIAGNOSIS — M79604 Pain in right leg: Secondary | ICD-10-CM | POA: Diagnosis not present

## 2016-01-16 DIAGNOSIS — Z969 Presence of functional implant, unspecified: Secondary | ICD-10-CM

## 2016-01-16 DIAGNOSIS — M5416 Radiculopathy, lumbar region: Secondary | ICD-10-CM

## 2016-01-16 HISTORY — DX: Radiculopathy, lumbar region: M54.16

## 2016-01-16 HISTORY — DX: Presence of functional implant, unspecified: Z96.9

## 2016-01-17 ENCOUNTER — Encounter: Payer: Medicare Other | Admitting: Medical

## 2016-01-17 ENCOUNTER — Telehealth: Payer: Self-pay | Admitting: Medical

## 2016-01-17 DIAGNOSIS — M4726 Other spondylosis with radiculopathy, lumbar region: Secondary | ICD-10-CM | POA: Insufficient documentation

## 2016-01-17 DIAGNOSIS — Z0289 Encounter for other administrative examinations: Secondary | ICD-10-CM

## 2016-01-17 DIAGNOSIS — M9903 Segmental and somatic dysfunction of lumbar region: Secondary | ICD-10-CM

## 2016-01-17 HISTORY — DX: Other spondylosis with radiculopathy, lumbar region: M47.26

## 2016-01-17 HISTORY — DX: Segmental and somatic dysfunction of lumbar region: M99.03

## 2016-01-17 NOTE — Progress Notes (Signed)
This encounter was created in error - please disregard.

## 2016-01-21 DIAGNOSIS — M9903 Segmental and somatic dysfunction of lumbar region: Secondary | ICD-10-CM | POA: Diagnosis not present

## 2016-01-22 DIAGNOSIS — M4726 Other spondylosis with radiculopathy, lumbar region: Secondary | ICD-10-CM | POA: Diagnosis not present

## 2016-01-22 DIAGNOSIS — M545 Low back pain: Secondary | ICD-10-CM | POA: Diagnosis not present

## 2016-01-22 DIAGNOSIS — M5116 Intervertebral disc disorders with radiculopathy, lumbar region: Secondary | ICD-10-CM | POA: Diagnosis not present

## 2016-01-22 DIAGNOSIS — Q760 Spina bifida occulta: Secondary | ICD-10-CM | POA: Diagnosis not present

## 2016-01-24 DIAGNOSIS — M9903 Segmental and somatic dysfunction of lumbar region: Secondary | ICD-10-CM | POA: Diagnosis not present

## 2016-01-27 DIAGNOSIS — M4726 Other spondylosis with radiculopathy, lumbar region: Secondary | ICD-10-CM | POA: Diagnosis not present

## 2016-01-27 DIAGNOSIS — M79641 Pain in right hand: Secondary | ICD-10-CM | POA: Diagnosis not present

## 2016-01-27 DIAGNOSIS — M7989 Other specified soft tissue disorders: Secondary | ICD-10-CM | POA: Diagnosis not present

## 2016-01-27 DIAGNOSIS — M1288 Other specific arthropathies, not elsewhere classified, other specified site: Secondary | ICD-10-CM | POA: Diagnosis not present

## 2016-01-27 DIAGNOSIS — M25531 Pain in right wrist: Secondary | ICD-10-CM | POA: Diagnosis not present

## 2016-01-27 NOTE — Telephone Encounter (Signed)
charge 

## 2016-01-27 NOTE — Telephone Encounter (Signed)
Pt was no show 01/17/16 for follow up appt, pt has not rescheduled, 1st no show, charge or no charge? °

## 2016-01-28 ENCOUNTER — Encounter: Payer: Self-pay | Admitting: Medical

## 2016-01-28 NOTE — Telephone Encounter (Signed)
Marked to charge and mailing no show letter °

## 2016-02-28 DIAGNOSIS — I10 Essential (primary) hypertension: Secondary | ICD-10-CM | POA: Diagnosis not present

## 2016-02-28 DIAGNOSIS — Z01818 Encounter for other preprocedural examination: Secondary | ICD-10-CM | POA: Diagnosis not present

## 2016-02-28 DIAGNOSIS — M79604 Pain in right leg: Secondary | ICD-10-CM | POA: Diagnosis not present

## 2016-02-28 DIAGNOSIS — Z01810 Encounter for preprocedural cardiovascular examination: Secondary | ICD-10-CM | POA: Diagnosis not present

## 2016-03-03 DIAGNOSIS — M79604 Pain in right leg: Secondary | ICD-10-CM | POA: Diagnosis not present

## 2016-03-03 DIAGNOSIS — Z969 Presence of functional implant, unspecified: Secondary | ICD-10-CM | POA: Diagnosis not present

## 2016-03-11 DIAGNOSIS — T8484XA Pain due to internal orthopedic prosthetic devices, implants and grafts, initial encounter: Secondary | ICD-10-CM | POA: Diagnosis not present

## 2016-03-11 DIAGNOSIS — T85848A Pain due to other internal prosthetic devices, implants and grafts, initial encounter: Secondary | ICD-10-CM | POA: Diagnosis not present

## 2016-03-11 DIAGNOSIS — M79604 Pain in right leg: Secondary | ICD-10-CM | POA: Diagnosis not present

## 2016-03-11 DIAGNOSIS — Z472 Encounter for removal of internal fixation device: Secondary | ICD-10-CM | POA: Diagnosis not present

## 2016-03-11 HISTORY — PX: FEMUR IM ROD REMOVAL: SHX1595

## 2016-03-26 DIAGNOSIS — Z4789 Encounter for other orthopedic aftercare: Secondary | ICD-10-CM | POA: Diagnosis not present

## 2016-04-15 DIAGNOSIS — M62838 Other muscle spasm: Secondary | ICD-10-CM

## 2016-04-15 HISTORY — DX: Other muscle spasm: M62.838

## 2016-05-03 ENCOUNTER — Encounter (HOSPITAL_BASED_OUTPATIENT_CLINIC_OR_DEPARTMENT_OTHER): Payer: Self-pay | Admitting: *Deleted

## 2016-05-03 ENCOUNTER — Emergency Department (HOSPITAL_BASED_OUTPATIENT_CLINIC_OR_DEPARTMENT_OTHER)
Admission: EM | Admit: 2016-05-03 | Discharge: 2016-05-03 | Disposition: A | Discharge status: Home / Self Care | Payer: Medicare Other | Attending: Emergency Medicine | Admitting: Emergency Medicine

## 2016-05-03 DIAGNOSIS — M79605 Pain in left leg: Secondary | ICD-10-CM | POA: Diagnosis present

## 2016-05-03 DIAGNOSIS — Z79899 Other long term (current) drug therapy: Secondary | ICD-10-CM | POA: Diagnosis not present

## 2016-05-03 DIAGNOSIS — F17210 Nicotine dependence, cigarettes, uncomplicated: Secondary | ICD-10-CM | POA: Diagnosis not present

## 2016-05-03 DIAGNOSIS — I10 Essential (primary) hypertension: Secondary | ICD-10-CM | POA: Diagnosis not present

## 2016-05-03 DIAGNOSIS — M62838 Other muscle spasm: Secondary | ICD-10-CM | POA: Diagnosis not present

## 2016-05-03 DIAGNOSIS — M62831 Muscle spasm of calf: Secondary | ICD-10-CM | POA: Diagnosis not present

## 2016-05-03 HISTORY — DX: Unspecified fracture of unspecified femur, initial encounter for closed fracture: S72.90XA

## 2016-05-03 MED ORDER — METHOCARBAMOL 500 MG PO TABS: 500.0000 mg | ORAL_TABLET | Freq: Two times a day (BID) | ORAL | 0 refills | Status: DC

## 2016-05-03 NOTE — ED Triage Notes (Signed)
Reports R upper leg pain. States she had a leg rod removed on March 11, 2016. Reports the pain is now radiating down into her calf. Denies fever, n/v/d, numbness/tingling, sob, chest pain. Reports exercises, ice/heat therapy, Motrin ineffective.

## 2016-05-03 NOTE — ED Provider Notes (Signed)
[facility] Provider [facility]   CSN: [id number] Arrival date & time: 05/03/16  0805     History   Chief Complaint Chief Complaint  Patient presents with  . Leg Pain    HPI Phyllis Adams is a 40 y.o. female.  Patient is a 40 year old female who recently had patient had surgery to remove a rod in her femur approximately 7 weeks ago. She states the pain she was having prior has completely resolved but she is been having muscle spasms since the surgery which seemed to be worsening. They are in her lateral right thigh down into her calf. They are worse with movement of her leg and walking. She states her thigh and calf feels tight all the time but then get a sharp shooting pain on top of that. She followed up with her orthopedist 2 weeks ago and repeat images were done which showed good healing of the surgical site. She was given exercises to do what she has been doing however the muscle cramping has not improved. She denies any swelling, numbness or tingling of the leg or foot. Surgical sites are healing well. She has been taking ibuprofen but states it does not help with the cramping.       Past Medical History:  Diagnosis Date  . Depression   . Femur fracture (HCC) 2006  . Hypertension   . Migraine   . Mood disorder (HCC)     There are no active problems to display for this patient.   Past Surgical History:  Procedure Laterality Date  . APPENDECTOMY    . CHOLECYSTECTOMY    . FEMUR FRACTURE SURGERY Right 2006  . FEMUR IM ROD REMOVAL Right 03/11/2016  . TUBAL LIGATION      OB History    No data available       Home Medications    Prior to Admission medications   Medication Sig Start Date End Date Taking? Authorizing Provider  Butalbital-APAP-Caffeine (FIORICET) 50-300-40 MG CAPS 1 tab po q 8 hrs as needed HA 12/04/15  Yes Edward [name], PA-C  ketorolac (TORADOL) 10 MG tablet Take 1 tablet (10 mg total) by mouth every 6 (six) hours as needed. 01/08/16    [name] [name], PA-C  metoCLOPramide (REGLAN) 10 MG tablet Take 1 tablet (10 mg total) by mouth every 6 (six) hours as needed for nausea or vomiting. 09/20/15   John Molpus, MD  pregabalin (LYRICA) 75 MG capsule Take 75 mg by mouth 2 (two) times daily.    Historical Provider, MD  traMADol (ULTRAM) 50 MG tablet Take 1 tablet (50 mg total) by mouth every 8 (eight) hours as needed. 12/17/15   Esperanza Richters, PA-C    Family History No family history on file.  Social History Social History  Substance Use Topics  . Smoking status: Current Every Day Smoker    Types: Cigars  . Smokeless tobacco: Never Used  . Alcohol use No     Allergies   Review of patient's allergies indicates no known allergies.   Review of Systems Review of Systems  All other systems reviewed and are negative.    Physical Exam Updated Vital Signs BP (!) 146/122 (BP Location: Left Arm)   Pulse 68   Temp 98.3 F (36.8 C) (Oral)   Resp 16   Ht 4\' 10"  (1.473 m)   Wt 140 lb (63.5 kg)   LMP 04/27/2016 (Approximate)   SpO2 100%   BMI 29.26 kg/m   Physical Exam  Constitutional: She  is oriented to person, place, and time. She appears well-developed and well-nourished. No distress.  HENT:  Head: Normocephalic and atraumatic.  Cardiovascular: Normal rate.   Pulmonary/Chest: Effort normal.  Musculoskeletal:       Right upper leg: She exhibits tenderness. She exhibits no bony tenderness.       Legs: No noted swelling in the right lower extremity with 2+ DP pulses normal sensation and movement of the ankle and foot  Neurological: She is alert and oriented to person, place, and time. She has normal strength. No sensory deficit.  Nursing note and vitals reviewed.    ED Treatments / Results  Labs (all labs ordered are listed, but only abnormal results are displayed) Labs Reviewed - No data to display  EKG  EKG Interpretation None       Radiology No results found.  Procedures Procedures (including  critical care time)  Medications Ordered in ED Medications - No data to display   Initial Impression / Assessment and Plan / ED Course  I have reviewed the triage vital signs and the nursing notes.  Pertinent labs & imaging results that were available during my care of the patient were reviewed by me and considered in my medical decision making (see chart for details).  Clinical Course   Patient with a recent surgery to remove a rod from her femur who is now having ongoing muscle spasms. Patient denies similar pain prior to the surgery and states that has resolved. However since the surgery she is having muscle spasms and they are not improving. She denies any swelling or redness with on exam no evidence of infection or concern for DVT at this time. She describes her muscles as feeling tight and pain is reproduced with extending the right lower extremity and palpation along the IT band. Patient encouraged to continue her ibuprofen but also given Robaxin. She will follow-up with her orthopedist if this does not improve and she continues to do recommended exercises.  Final Clinical Impressions(s) / ED Diagnoses   Final diagnoses:  Muscle spasm of left lower extremity    New Prescriptions New Prescriptions   METHOCARBAMOL (ROBAXIN) 500 MG TABLET    Take 1 tablet (500 mg total) by mouth 2 (two) times daily.     Gwyneth SproutWhitney Lavenia Stumpo, MD 05/03/16 [phone]

## 2016-06-03 ENCOUNTER — Encounter: Payer: Self-pay | Admitting: Medical

## 2016-06-03 ENCOUNTER — Ambulatory Visit (INDEPENDENT_AMBULATORY_CARE_PROVIDER_SITE_OTHER): Payer: Medicare Other | Admitting: Medical

## 2016-06-03 VITALS — BP 137/96 | HR 69 | Temp 98.7°F | Ht <= 58 in | Wt 144.8 lb

## 2016-06-03 DIAGNOSIS — M25561 Pain in right knee: Secondary | ICD-10-CM

## 2016-06-03 MED ORDER — SUMATRIPTAN SUCCINATE 50 MG PO TABS: ORAL_TABLET | ORAL | 0 refills | Status: DC

## 2016-06-03 MED ORDER — AMOXICILLIN-POT CLAVULANATE 875-125 MG PO TABS: 1.0000 | ORAL_TABLET | Freq: Two times a day (BID) | ORAL | 0 refills | Status: DC

## 2016-06-03 MED ORDER — TRAMADOL HCL 50 MG PO TABS: 50.0000 mg | ORAL_TABLET | Freq: Four times a day (QID) | ORAL | 0 refills | Status: DC | PRN

## 2016-06-03 MED ORDER — ONDANSETRON 8 MG PO TBDP: 8.0000 mg | ORAL_TABLET | Freq: Three times a day (TID) | ORAL | 0 refills | Status: DC | PRN

## 2016-06-03 MED ORDER — FLUTICASONE PROPIONATE 50 MCG/ACT NA SUSP: 2.0000 | Freq: Every day | NASAL | 1 refills | Status: DC

## 2016-06-03 NOTE — Patient Instructions (Addendum)
For you leg pain(post rod removal) will rx tramadol. Continue lyrica and alleve.  We will follow up in a week with you and see if this regimen controls you pain. If so then will need to follow [place] controlled meds guidelines.  For possible sinus infection followed recent potential allergic rhinitis will rx flonase and augmentin.  For migraine ha will rx imitrex. For nausea associated with migaine rx zofran. If severe ha with associated signs or symptoms as discussed then ED evaluation.  Follow up in one week or as needed

## 2016-06-03 NOTE — Progress Notes (Signed)
Subjective:    Patient ID: Phyllis Adams, female    DOB: 06/09/1976, 40 y.o.   MRN: [id number]  HPI  Pt in for follow up.  Pt did have her rod removed from rt thigh July 5th. Pt states since rod has been removed she does not have back pain. Now she states mostly has pain late afternoon and night has pain. Also some pain when seated for long periods have pain. Pt is able to work with pain.  Pt after surgery did not go to PT.  Pt not in pain management. Pain moderate to severe at end of the day. But overall better than before.  . Pt went  ED end of august gave robaxin and did not help.    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  Pt rod was in thigh for 10 years.  Pt also on review had history of migraines. She states usually with menses will get ha with sound and light sensitive. Nausea of vomiting. This has been going on for 11 years. Pt has been on butalbital for ha in the past. This is not helping recently. LMP- one week ago(normal and came when expected). Recent ha came tail end of allergies vs uri.  Recent ha last 4 days. But not as severe as it was in beginning.   Some sinus pressure. Point to maxillary sinus area. Some colored mucous from her nose.   Review of Systems  Constitutional: Negative for chills, fatigue and fever.  HENT: Positive for postnasal drip, sinus pressure and sneezing. Negative for congestion, ear pain and facial swelling.   Respiratory: Negative for cough, chest tightness, shortness of breath and wheezing.   Cardiovascular: Negative for chest pain and palpitations.  Gastrointestinal: Negative for abdominal distention, anal bleeding and blood in stool.  Genitourinary: Negative for dyspareunia and dysuria.  Musculoskeletal: Negative for back pain.       Leg pain. Some buttox pain.  Skin: Negative for rash.  Neurological: Positive for headaches. Negative for dizziness, seizures, speech difficulty, weakness and light-headedness.       Low level  presently.  Hematological: Negative for adenopathy. Does not bruise/bleed easily.  Psychiatric/Behavioral: Negative for behavioral problems and confusion.     Past Medical History:  Diagnosis Date  . Depression   . Femur fracture (HCC) 2006  . Hypertension   . Migraine   . Mood disorder [facility](HCC)      Social History   Social History  . Marital status: Legally Separated    Spouse name: N/A  . Number of children: N/A  . Years of education: N/A   Occupational History  . Not on file.   Social History Main Topics  . Smoking status: Current Every Day Smoker    Types: Cigars  . Smokeless tobacco: Never Used  . Alcohol use No  . Drug use:     Types: Marijuana  . Sexual activity: Yes    Birth control/ protection: Other-see comments, Surgical   Other Topics Concern  . Not on file   Social History Narrative  . No narrative on file    Past Surgical History:  Procedure Laterality Date  . APPENDECTOMY    . CHOLECYSTECTOMY    . FEMUR FRACTURE SURGERY Right 2006  . FEMUR IM ROD REMOVAL Right 03/11/2016  . TUBAL LIGATION      No family history on file.  No Known Allergies  Current Outpatient Prescriptions on File Prior to Visit  Medication Sig Dispense Refill  .  Butalbital-APAP-Caffeine (FIORICET) 50-300-40 MG CAPS 1 tab po q 8 hrs as needed HA 30 capsule 0  . methocarbamol (ROBAXIN) 500 MG tablet Take 1 tablet (500 mg total) by mouth 2 (two) times daily. 20 tablet 0  . pregabalin (LYRICA) 75 MG capsule Take 75 mg by mouth 2 (two) times daily.     No current facility-administered medications on file prior to visit.     BP (!) 137/96   Pulse 69   Temp 98.7 F (37.1 C) (Oral)   Ht 4\' 10"  (1.473 m)   Wt 144 lb 12.8 oz (65.7 kg)   LMP 05/26/2016   SpO2 100%   BMI 30.26 kg/m       Objective:   Physical Exam   General  Mental Status - Alert. General Appearance - Well groomed. Not in acute distress.  Skin Rashes- No Rashes.  HEENT Head-  Normal. Ear Auditory Canal - Left- Normal. Right - Normal.Tympanic Membrane- Left- Normal. Right- Normal. Eye Sclera/Conjunctiva- Left- Normal. Right- Normal. Nose & Sinuses Nasal Mucosa- Left-  Boggy and Congested. Right-  Boggy and  Congested.Bilateral mild maxillary pressure but no frontal sinus pressure.  Mouth & Throat Lips: Upper Lip- Normal: no dryness, cracking, pallor, cyanosis, or vesicular eruption. Lower Lip-Normal: no dryness, cracking, pallor, cyanosis or vesicular eruption. Buccal Mucosa- Bilateral- No Aphthous ulcers. Oropharynx- No Discharge or Erythema. Tonsils: Characteristics- Bilateral- No Erythema or Congestion. Size/Enlargement- Bilateral- No enlargement. Discharge- bilateral-None.  Neck Neck- Supple. No Masses.   Chest and Lung Exam Auscultation: Breath Sounds:-Clear even and unlabored.  Cardiovascular Auscultation:Rythm- Regular, rate and rhythm. Murmurs & Other Heart Sounds:Ausculatation of the heart reveal- No Murmurs.  Lymphatic Head & Neck General Head & Neck Lymphatics: Bilateral: Description- No Localized lymphadenopathy.  Back- no mid lumbar tenderness to palpation.  Rt lower ext- no pain on palpation but on getting up thigh area pain.(no calf pain and neg homans sign) Rt buttox- faint mid region tenderness.   Neurologic Cranial Nerve exam:- CN III-XII intact(No nystagmus), symmetric smile. Strength:- 5/5 equal and symmetric strength both upper and lower extremities.      Assessment & Plan:  For you leg pain(post rod removal) will rx tramadol. Continue lyrica and alleve.  We will follow up in a week with you and see if this regimen controls you pain. If so then will need to follow [place] controlled meds guidelines.  For possible sinus infection following  recent potential allergic rhinitis  will rx flonase and augmentin.  For migraine ha will rx imitrex. For nausea associated with migaine rx zofran. If severe ha with associated signs or  symptoms as discussed then ED evaluation.   Follow up in one week or as needed  Kalyb Pemble, [name], [employer]

## 2016-06-03 NOTE — Progress Notes (Signed)
Pre visit review using our clinic tool,if applicable. No additional management support is needed unless otherwise documented below in the visit note.  

## 2016-06-10 ENCOUNTER — Encounter: Payer: Self-pay | Admitting: Medical

## 2016-06-10 ENCOUNTER — Ambulatory Visit: Payer: Medicare Other | Admitting: Medical

## 2016-06-10 MED ORDER — CEPHALEXIN 500 MG PO CAPS: 500.0000 mg | ORAL_CAPSULE | Freq: Two times a day (BID) | ORAL | 0 refills | Status: DC

## 2016-06-10 NOTE — Telephone Encounter (Signed)
rx keflex sent to pharmacy

## 2016-07-06 ENCOUNTER — Other Ambulatory Visit: Payer: Self-pay | Admitting: Medical

## 2016-07-09 DIAGNOSIS — M79651 Pain in right thigh: Secondary | ICD-10-CM | POA: Diagnosis not present

## 2016-07-09 DIAGNOSIS — I10 Essential (primary) hypertension: Secondary | ICD-10-CM | POA: Diagnosis not present

## 2016-07-09 DIAGNOSIS — G629 Polyneuropathy, unspecified: Secondary | ICD-10-CM | POA: Diagnosis not present

## 2016-07-09 DIAGNOSIS — J45909 Unspecified asthma, uncomplicated: Secondary | ICD-10-CM | POA: Diagnosis not present

## 2016-07-09 DIAGNOSIS — Z87891 Personal history of nicotine dependence: Secondary | ICD-10-CM | POA: Diagnosis not present

## 2016-07-09 DIAGNOSIS — M79604 Pain in right leg: Secondary | ICD-10-CM | POA: Diagnosis not present

## 2016-07-10 ENCOUNTER — Ambulatory Visit (INDEPENDENT_AMBULATORY_CARE_PROVIDER_SITE_OTHER): Payer: Medicare Other | Admitting: Physician Assistant

## 2016-07-10 ENCOUNTER — Encounter: Payer: Self-pay | Admitting: Physician Assistant

## 2016-07-10 VITALS — BP 135/95 | HR 77 | Temp 98.8°F | Ht <= 58 in | Wt 152.2 lb

## 2016-07-10 DIAGNOSIS — M25551 Pain in right hip: Secondary | ICD-10-CM | POA: Diagnosis not present

## 2016-07-10 DIAGNOSIS — M5441 Lumbago with sciatica, right side: Secondary | ICD-10-CM | POA: Diagnosis not present

## 2016-07-10 DIAGNOSIS — G8929 Other chronic pain: Secondary | ICD-10-CM

## 2016-07-10 MED ORDER — PREGABALIN 200 MG PO CAPS: 200.0000 mg | ORAL_CAPSULE | Freq: Two times a day (BID) | ORAL | 1 refills | Status: DC

## 2016-07-10 NOTE — Patient Instructions (Addendum)
Please start the new dose of Lyrica.  Finish the steroid pack. You can add on a Tylenol Arthritis as well since you do not tolerate stronger pain medications. Continue sciatica stretches. I have put a copy of the stretches below. I want you to call you Neurologist and see if they will move up your appointment. If any issue, let us know and we will call as well.  If anything acutely worsens, please go to the ER.   Sciatica With Rehab The sciatic nerve runs from the back down the leg and is responsible for sensation and control of the muscles in the back (posterior) side of the thigh, lower leg, and foot. Sciatica is a condition that is characterized by inflammation of this nerve.  SYMPTOMS   Signs of nerve damage, including numbness and/or weakness along the posterior side of the lower extremity.  Pain in the back of the thigh that may also travel down the leg.  Pain that worsens when sitting for long periods of time.  Occasionally, pain in the back or buttock. CAUSES  Inflammation of the sciatic nerve is the cause of sciatica. The inflammation is due to something irritating the nerve. Common sources of irritation include:  Sitting for long periods of time.  Direct trauma to the nerve.  Arthritis of the spine.  Herniated or ruptured disk.  Slipping of the vertebrae (spondylolisthesis).  Pressure from soft tissues, such as muscles or ligament-like tissue (fascia). RISK INCREASES WITH:  Sports that place pressure or stress on the spine (football or weightlifting).  Poor strength and flexibility.  Failure to warm up properly before activity.  Family history of low back pain or disk disorders.  Previous back injury or surgery.  Poor body mechanics, especially when lifting, or poor posture. PREVENTION   Warm up and stretch properly before activity.  Maintain physical fitness:  Strength, flexibility, and endurance.  Cardiovascular fitness.  Learn and use proper  technique, especially with posture and lifting. When possible, have coach correct improper technique.  Avoid activities that place stress on the spine. PROGNOSIS If treated properly, then sciatica usually resolves within 6 weeks. However, occasionally surgery is necessary.  RELATED COMPLICATIONS   Permanent nerve damage, including pain, numbness, tingle, or weakness.  Chronic back pain.  Risks of surgery: infection, bleeding, nerve damage, or damage to surrounding tissues. TREATMENT Treatment initially involves resting from any activities that aggravate your symptoms. The use of ice and medication may help reduce pain and inflammation. The use of strengthening and stretching exercises may help reduce pain with activity. These exercises may be performed at home or with referral to a therapist. A therapist may recommend further treatments, such as transcutaneous electronic nerve stimulation (TENS) or ultrasound. Your caregiver may recommend corticosteroid injections to help reduce inflammation of the sciatic nerve. If symptoms persist despite non-surgical (conservative) treatment, then surgery may be recommended. MEDICATION  If pain medication is necessary, then nonsteroidal anti-inflammatory medications, such as aspirin and ibuprofen, or other minor pain relievers, such as acetaminophen, are often recommended.  Do not take pain medication for 7 days before surgery.  Prescription pain relievers may be given if deemed necessary by your caregiver. Use only as directed and only as much as you need.  Ointments applied to the skin may be helpful.  Corticosteroid injections may be given by your caregiver. These injections should be reserved for the most serious cases, because they may only be given a certain number of times. HEAT AND COLD  Cold treatment (icing)  relieves pain and reduces inflammation. Cold treatment should be applied for 10 to 15 minutes every 2 to 3 hours for inflammation and  pain and immediately after any activity that aggravates your symptoms. Use ice packs or massage the area with a piece of ice (ice massage).  Heat treatment may be used prior to performing the stretching and strengthening activities prescribed by your caregiver, physical therapist, or athletic trainer. Use a heat pack or soak the injury in warm water. SEEK MEDICAL CARE IF:  Treatment seems to offer no benefit, or the condition worsens.  Any medications produce adverse side effects. EXERCISES  RANGE OF MOTION (ROM) AND STRETCHING EXERCISES - Sciatica Most people with sciatic will find that their symptoms worsen with either excessive bending forward (flexion) or arching at the low back (extension). The exercises which will help resolve your symptoms will focus on the opposite motion. Your physician, physical therapist or athletic trainer will help you determine which exercises will be most helpful to resolve your low back pain. Do not complete any exercises without first consulting with your clinician. Discontinue any exercises which worsen your symptoms until you speak to your clinician. If you have pain, numbness or tingling which travels down into your buttocks, leg or foot, the goal of the therapy is for these symptoms to move closer to your back and eventually resolve. Occasionally, these leg symptoms will get better, but your low back pain may worsen; this is typically an indication of progress in your rehabilitation. Be certain to be very alert to any changes in your symptoms and the activities in which you participated in the 24 hours prior to the change. Sharing this information with your clinician will allow him/her to most efficiently treat your condition. These exercises may help you when beginning to rehabilitate your injury. Your symptoms may resolve with or without further involvement from your physician, physical therapist or athletic trainer. While completing these exercises, remember:    Restoring tissue flexibility helps normal motion to return to the joints. This allows healthier, less painful movement and activity.  An effective stretch should be held for at least 30 seconds.  A stretch should never be painful. You should only feel a gentle lengthening or release in the stretched tissue. FLEXION RANGE OF MOTION AND STRETCHING EXERCISES: STRETCH - Flexion, Single Knee to Chest   Lie on a firm bed or floor with both legs extended in front of you.  Keeping one leg in contact with the floor, bring your opposite knee to your chest. Hold your leg in place by either grabbing behind your thigh or at your knee.  Pull until you feel a gentle stretch in your low back. Hold __________ seconds.  Slowly release your grasp and repeat the exercise with the opposite side. Repeat __________ times. Complete this exercise __________ times per day.  STRETCH - Flexion, Double Knee to Chest  Lie on a firm bed or floor with both legs extended in front of you.  Keeping one leg in contact with the floor, bring your opposite knee to your chest.  Tense your stomach muscles to support your back and then lift your other knee to your chest. Hold your legs in place by either grabbing behind your thighs or at your knees.  Pull both knees toward your chest until you feel a gentle stretch in your low back. Hold __________ seconds.  Tense your stomach muscles and slowly return one leg at a time to the floor. Repeat __________ times. Complete this  exercise __________ times per day.  STRETCH - Low Trunk Rotation   Lie on a firm bed or floor. Keeping your legs in front of you, bend your knees so they are both pointed toward the ceiling and your feet are flat on the floor.  Extend your arms out to the side. This will stabilize your upper body by keeping your shoulders in contact with the floor.  Gently and slowly drop both knees together to one side until you feel a gentle stretch in your low back.  Hold for __________ seconds.  Tense your stomach muscles to support your low back as you bring your knees back to the starting position. Repeat the exercise to the other side. Repeat __________ times. Complete this exercise __________ times per day  EXTENSION RANGE OF MOTION AND FLEXIBILITY EXERCISES: STRETCH - Extension, Prone on Elbows  Lie on your stomach on the floor, a bed will be too soft. Place your palms about shoulder width apart and at the height of your head.  Place your elbows under your shoulders. If this is too painful, stack pillows under your chest.  Allow your body to relax so that your hips drop lower and make contact more completely with the floor.  Hold this position for __________ seconds.  Slowly return to lying flat on the floor. Repeat __________ times. Complete this exercise __________ times per day.  RANGE OF MOTION - Extension, Prone Press Ups  Lie on your stomach on the floor, a bed will be too soft. Place your palms about shoulder width apart and at the height of your head.  Keeping your back as relaxed as possible, slowly straighten your elbows while keeping your hips on the floor. You may adjust the placement of your hands to maximize your comfort. As you gain motion, your hands will come more underneath your shoulders.  Hold this position __________ seconds.  Slowly return to lying flat on the floor. Repeat __________ times. Complete this exercise __________ times per day.  STRENGTHENING EXERCISES - Sciatica  These exercises may help you when beginning to rehabilitate your injury. These exercises should be done near your "sweet spot." This is the neutral, low-back arch, somewhere between fully rounded and fully arched, that is your least painful position. When performed in this safe range of motion, these exercises can be used for people who have either a flexion or extension based injury. These exercises may resolve your symptoms with or without further  involvement from your physician, physical therapist or athletic trainer. While completing these exercises, remember:   Muscles can gain both the endurance and the strength needed for everyday activities through controlled exercises.  Complete these exercises as instructed by your physician, physical therapist or athletic trainer. Progress with the resistance and repetition exercises only as your caregiver advises.  You may experience muscle soreness or fatigue, but the pain or discomfort you are trying to eliminate should never worsen during these exercises. If this pain does worsen, stop and make certain you are following the directions exactly. If the pain is still present after adjustments, discontinue the exercise until you can discuss the trouble with your clinician. STRENGTHENING - Deep Abdominals, Pelvic Tilt   Lie on a firm bed or floor. Keeping your legs in front of you, bend your knees so they are both pointed toward the ceiling and your feet are flat on the floor.  Tense your lower abdominal muscles to press your low back into the floor. This motion will rotate your pelvis  so that your tail bone is scooping upwards rather than pointing at your feet or into the floor.  With a gentle tension and even breathing, hold this position for __________ seconds. Repeat __________ times. Complete this exercise __________ times per day.  STRENGTHENING - Abdominals, Crunches   Lie on a firm bed or floor. Keeping your legs in front of you, bend your knees so they are both pointed toward the ceiling and your feet are flat on the floor. Cross your arms over your chest.  Slightly tip your chin down without bending your neck.  Tense your abdominals and slowly lift your trunk high enough to just clear your shoulder blades. Lifting higher can put excessive stress on the low back and does not further strengthen your abdominal muscles.  Control your return to the starting position. Repeat __________  times. Complete this exercise __________ times per day.  STRENGTHENING - Quadruped, Opposite UE/LE Lift  Assume a hands and knees position on a firm surface. Keep your hands under your shoulders and your knees under your hips. You may place padding under your knees for comfort.  Find your neutral spine and gently tense your abdominal muscles so that you can maintain this position. Your shoulders and hips should form a rectangle that is parallel with the floor and is not twisted.  Keeping your trunk steady, lift your right hand no higher than your shoulder and then your left leg no higher than your hip. Make sure you are not holding your breath. Hold this position __________ seconds.  Continuing to keep your abdominal muscles tense and your back steady, slowly return to your starting position. Repeat with the opposite arm and leg. Repeat __________ times. Complete this exercise __________ times per day.  STRENGTHENING - Abdominals and Quadriceps, Straight Leg Raise   Lie on a firm bed or floor with both legs extended in front of you.  Keeping one leg in contact with the floor, bend the other knee so that your foot can rest flat on the floor.  Find your neutral spine, and tense your abdominal muscles to maintain your spinal position throughout the exercise.  Slowly lift your straight leg off the floor about 6 inches for a count of 15, making sure to not hold your breath.  Still keeping your neutral spine, slowly lower your leg all the way to the floor. Repeat this exercise with each leg __________ times. Complete this exercise __________ times per day. POSTURE AND BODY MECHANICS CONSIDERATIONS - Sciatica Keeping correct posture when sitting, standing or completing your activities will reduce the stress put on different body tissues, allowing injured tissues a chance to heal and limiting painful experiences. The following are general guidelines for improved posture. Your physician or physical  therapist will provide you with any instructions specific to your needs. While reading these guidelines, remember:  The exercises prescribed by your provider will help you have the flexibility and strength to maintain correct postures.  The correct posture provides the optimal environment for your joints to work. All of your joints have less wear and tear when properly supported by a spine with good posture. This means you will experience a healthier, less painful body.  Correct posture must be practiced with all of your activities, especially prolonged sitting and standing. Correct posture is as important when doing repetitive low-stress activities (typing) as it is when doing a single heavy-load activity (lifting). RESTING POSITIONS Consider which positions are most painful for you when choosing a resting position. If  you have pain with flexion-based activities (sitting, bending, stooping, squatting), choose a position that allows you to rest in a less flexed posture. You would want to avoid curling into a fetal position on your side. If your pain worsens with extension-based activities (prolonged standing, working overhead), avoid resting in an extended position such as sleeping on your stomach. Most people will find more comfort when they rest with their spine in a more neutral position, neither too rounded nor too arched. Lying on a non-sagging bed on your side with a pillow between your knees, or on your back with a pillow under your knees will often provide some relief. Keep in mind, being in any one position for a prolonged period of time, no matter how correct your posture, can still lead to stiffness. PROPER SITTING POSTURE In order to minimize stress and discomfort on your spine, you must sit with correct posture Sitting with good posture should be effortless for a healthy body. Returning to good posture is a gradual process. Many people can work toward this most comfortably by using various  supports until they have the flexibility and strength to maintain this posture on their own. When sitting with proper posture, your ears will fall over your shoulders and your shoulders will fall over your hips. You should use the back of the chair to support your upper back. Your low back will be in a neutral position, just slightly arched. You may place a small pillow or folded towel at the base of your low back for support.  When working at a desk, create an environment that supports good, upright posture. Without extra support, muscles fatigue and lead to excessive strain on joints and other tissues. Keep these recommendations in mind: CHAIR:   A chair should be able to slide under your desk when your back makes contact with the back of the chair. This allows you to work closely.  The chair's height should allow your eyes to be level with the upper part of your monitor and your hands to be slightly lower than your elbows. BODY POSITION  Your feet should make contact with the floor. If this is not possible, use a foot rest.  Keep your ears over your shoulders. This will reduce stress on your neck and low back. INCORRECT SITTING POSTURES   If you are feeling tired and unable to assume a healthy sitting posture, do not slouch or slump. This puts excessive strain on your back tissues, causing more damage and pain. Healthier options include:  Using more support, like a lumbar pillow.  Switching tasks to something that requires you to be upright or walking.  Talking a brief walk.  Lying down to rest in a neutral-spine position. PROLONGED STANDING WHILE SLIGHTLY LEANING FORWARD  When completing a task that requires you to lean forward while standing in one place for a long time, place either foot up on a stationary 2-4 inch high object to help maintain the best posture. When both feet are on the ground, the low back tends to lose its slight inward curve. If this curve flattens (or becomes too  large), then the back and your other joints will experience too much stress, fatigue more quickly and can cause pain.  CORRECT STANDING POSTURES Proper standing posture should be assumed with all daily activities, even if they only take a few moments, like when brushing your teeth. As in sitting, your ears should fall over your shoulders and your shoulders should fall over your hips.  You should keep a slight tension in your abdominal muscles to brace your spine. Your tailbone should point down to the ground, not behind your body, resulting in an over-extended swayback posture.  INCORRECT STANDING POSTURES  Common incorrect standing postures include a forward head, locked knees and/or an excessive swayback. WALKING Walk with an upright posture. Your ears, shoulders and hips should all line-up. PROLONGED ACTIVITY IN A FLEXED POSITION When completing a task that requires you to bend forward at your waist or lean over a low surface, try to find a way to stabilize 3 of 4 of your limbs. You can place a hand or elbow on your thigh or rest a knee on the surface you are reaching across. This will provide you more stability so that your muscles do not fatigue as quickly. By keeping your knees relaxed, or slightly bent, you will also reduce stress across your low back. CORRECT LIFTING TECHNIQUES DO :   Assume a wide stance. This will provide you more stability and the opportunity to get as close as possible to the object which you are lifting.  Tense your abdominals to brace your spine; then bend at the knees and hips. Keeping your back locked in a neutral-spine position, lift using your leg muscles. Lift with your legs, keeping your back straight.  Test the weight of unknown objects before attempting to lift them.  Try to keep your elbows locked down at your sides in order get the best strength from your shoulders when carrying an object.  Always ask for help when lifting heavy or awkward  objects. INCORRECT LIFTING TECHNIQUES DO NOT:   Lock your knees when lifting, even if it is a small object.  Bend and twist. Pivot at your feet or move your feet when needing to change directions.  Assume that you cannot safely pick up a paperclip without proper posture.   This information is not intended to replace advice given to you by your health care provider. Make sure you discuss any questions you have with your health care provider.   Document Released: 08/24/2005 Document Revised: 01/08/2015 Document Reviewed: 12/06/2008 Elsevier Interactive Patient Education [employer]

## 2016-07-10 NOTE — Progress Notes (Signed)
Pre visit review using our clinic tool,if applicable. No additional management support is needed unless otherwise documented below in the visit note.  

## 2016-07-10 NOTE — Progress Notes (Signed)
Patient presents to clinic today as ER follow-up of R-sided lumbar back pain with sciatica. Patient endorses chronic history of lower back and extremity issues, s/p recent femoral rod removal. ER workup included lower back imaging and US of RLE that was negative for DVT. Patient was given steroid pack to start in addition to chronic medication regimen. Patient endorses she cannot tolerate narcotic pain medications. Pain is sharp and constantly radiating into R lower extremity. Denies numbness, tingling or weakness. Denies saddle anesthesia or change to bowel/bladder habits.   Past Medical History:  Diagnosis Date  . Depression   . Femur fracture (HCC) 2006  . Hypertension   . Migraine   . Mood disorder [facility](HCC)     Current Outpatient Prescriptions on File Prior to Visit  Medication Sig Dispense Refill  . Butalbital-APAP-Caffeine (FIORICET) 50-300-40 MG CAPS 1 tab po q 8 hrs as needed HA 30 capsule 0  . fluticasone (FLONASE) 50 MCG/ACT nasal spray Place 2 sprays into both nostrils daily. 16 g 1  . ondansetron (ZOFRAN ODT) 8 MG disintegrating tablet Take 1 tablet (8 mg total) by mouth every 8 (eight) hours as needed for nausea or vomiting. 15 tablet 0  . pregabalin (LYRICA) 75 MG capsule Take 75 mg by mouth 2 (two) times daily.    . SUMAtriptan (IMITREX) 50 MG tablet .1 tab po at onset of ha. Repeat in 2 hours if ha persists(max number of tabs is 2 in any 24 hour period) 10 tablet 0  . traMADol (ULTRAM) 50 MG tablet Take 1 tablet (50 mg total) by mouth every 6 (six) hours as needed for severe pain. 16 tablet 0   No current facility-administered medications on file prior to visit.     Allergies  Allergen Reactions  . Tramadol Shortness Of Breath  . Hydrocodone     No family history on file.  Social History   Social History  . Marital status: Legally Separated    Spouse name: N/A  . Number of children: N/A  . Years of education: N/A   Social History Main Topics  . Smoking  status: Current Every Day Smoker    Types: Cigars  . Smokeless tobacco: Never Used  . Alcohol use No  . Drug use:     Types: Marijuana  . Sexual activity: Yes    Birth control/ protection: Other-see comments, Surgical   Other Topics Concern  . None   Social History Narrative  . None    Review of Systems - See HPI.  All other ROS are negative.  BP (!) 135/95   Pulse 77   Temp 98.8 F (37.1 C) (Oral)   Ht 4\' 10"  (1.473 m)   Wt 152 lb 3.2 oz (69 kg)   SpO2 100%   BMI 31.81 kg/m   Physical Exam  Constitutional: She is oriented to person, place, and time and well-developed, well-nourished, and in no distress.  HENT:  Head: Normocephalic and atraumatic.  Eyes: Conjunctivae are normal.  Neck: Neck supple.  Cardiovascular: Normal rate, regular rhythm, normal heart sounds and intact distal pulses.   Pulmonary/Chest: Effort normal and breath sounds normal. No respiratory distress. She has no wheezes. She has no rales. She exhibits no tenderness.  Musculoskeletal:       Right hip: She exhibits tenderness. She exhibits normal strength and no bony tenderness.       Left hip: Normal.       Lumbar back: She exhibits pain. She exhibits no tenderness and  no bony tenderness.  Neurological: She is alert and oriented to person, place, and time.  Skin: Skin is warm and dry. No rash noted.  Psychiatric: Affect normal.  Vitals reviewed.  Assessment/Plan: 1. Chronic right-sided low back pain with right-sided sciatica Continue steroid taper. Unable to tolerate strong pain medications. Avoiding NSAIDs due to use of steroid at present. Will increase Lyrica to 200 mg BID. Stretches and supportive measures reviewed. Patient encouraged to contact Neurologist to see if they can expedite her appointment. FU with PCP as scheduled.  2. Right hip pain Question if there is a bursitis contributing to symptoms in addition to the sciatica. Care as discussed above. Referral to Sports Medicine placed for  further assessment.  - AMB referral to sports medicine   Piedad ClimesMartin, Madelin Weseman Cody, PA-C

## 2016-07-12 ENCOUNTER — Encounter (HOSPITAL_BASED_OUTPATIENT_CLINIC_OR_DEPARTMENT_OTHER): Payer: Self-pay | Admitting: Emergency Medicine

## 2016-07-12 ENCOUNTER — Emergency Department (HOSPITAL_BASED_OUTPATIENT_CLINIC_OR_DEPARTMENT_OTHER)
Admission: EM | Admit: 2016-07-12 | Discharge: 2016-07-12 | Disposition: A | Discharge status: Home / Self Care | Payer: Medicare Other | Attending: Emergency Medicine | Admitting: Emergency Medicine

## 2016-07-12 DIAGNOSIS — F17290 Nicotine dependence, other tobacco product, uncomplicated: Secondary | ICD-10-CM | POA: Diagnosis not present

## 2016-07-12 DIAGNOSIS — M79604 Pain in right leg: Secondary | ICD-10-CM | POA: Diagnosis not present

## 2016-07-12 DIAGNOSIS — I10 Essential (primary) hypertension: Secondary | ICD-10-CM | POA: Insufficient documentation

## 2016-07-12 DIAGNOSIS — M5431 Sciatica, right side: Secondary | ICD-10-CM | POA: Insufficient documentation

## 2016-07-12 NOTE — ED Notes (Signed)
Pt states she has had pain in her right leg since a rod was removed.

## 2016-07-12 NOTE — ED Notes (Signed)
Pt given d/c instructions as per chart. Verbalizes understanding. No questions. 

## 2016-07-12 NOTE — ED Triage Notes (Signed)
Pt in c/o worsening neuropathy numbness and pain to R leg. Was seen by PCP and changed dose of Lyrica with no relief, also seen at [facility]. Pt alert, interactive, ambulatory in NAD.

## 2016-07-12 NOTE — ED Provider Notes (Signed)
[facility] Provider Note   CSN: [id number] Arrival date & time: 07/12/16  1641  By signing my name below, I, [name], attest that this documentation has been prepared under the direction and in the presence of [name], MD.  Electronically Signed: Octavia HeirArianna Nassar, ED Scribe. 07/12/16. 5:23 PM.    History   Chief Complaint Chief Complaint  Patient presents with  . Numbness  . Leg Pain    The history is provided by the patient. No language interpreter was used.   HPI Comments: Phyllis DeutscherRasheda Adams is a 40 y.o. female who presents to the Emergency Department complaining of gradual worsening, moderate, acute on chronic right lower extremity pain. Pt says the pain radiates into her right inguinal region and into the lateral aspect of her right lower extremity. She reports having a rod taken out of her left thigh due to femur fracture that occurred in 2007. (Surgery performed by Dr. Louis MatteKenneth Lennon) Pt says that she was diagnosed with sciatica and muscle spasm in her right lower extremity. She expresses that she is unable to sleep, sit down, or drive secondary to the pain. Pt [name] where she has a DVT doppler study performed that came back unremarkable. Pt was further evaluated by her PCP on 11/03 and had her Lyrica dosage increased to 200 mg. Denies hx of DVT in legs or any other injury to her right lower extremity.   Past Medical History:  Diagnosis Date  . Depression   . Femur fracture (HCC) 2006  . Hypertension   . Migraine   . Mood disorder (HCC)     There are no active problems to display for this patient.   Past Surgical History:  Procedure Laterality Date  . APPENDECTOMY    . CHOLECYSTECTOMY    . FEMUR FRACTURE SURGERY Right 2006  . FEMUR IM ROD REMOVAL Right 03/11/2016  . TUBAL LIGATION      OB History    No data available       Home Medications    Prior to Admission medications   Medication Sig Start  Date End Date Taking? Authorizing Provider  Butalbital-APAP-Caffeine (FIORICET) 50-300-40 MG CAPS 1 tab po q 8 hrs as needed HA 12/04/15   Edward [name], PA-C  fluticasone (FLONASE) 50 MCG/ACT nasal spray Place 2 sprays into both nostrils daily. 06/03/16   [name] [name], PA-C  ondansetron (ZOFRAN ODT) 8 MG disintegrating tablet Take 1 tablet (8 mg total) by mouth every 8 (eight) hours as needed for nausea or vomiting. 06/03/16   Esperanza RichtersEdward [name], PA-C  pregabalin (LYRICA) 200 MG capsule Take 1 capsule (200 mg total) by mouth 2 (two) times daily. 07/10/16   Waldon MerlWilliam C Martin, PA-C  SUMAtriptan (IMITREX) 50 MG tablet .1 tab po at onset of ha. Repeat in 2 hours if ha persists(max number of tabs is 2 in any 24 hour period) 06/03/16   Esperanza RichtersEdward [name], PA-C    Family History History reviewed. No pertinent family history.  Social History Social History  Substance Use Topics  . Smoking status: Current Every Day Smoker    Types: Cigars  . Smokeless tobacco: Never Used  . Alcohol use No     Allergies   Tramadol and Hydrocodone   Review of Systems Review of Systems  A complete 10 system review of systems was obtained and all systems are negative except as noted in the HPI and PMH.   Physical Exam Updated Vital Signs BP [name](!)  165/108   Pulse 95   Temp 98.3 F (36.8 C)   Resp 20   Ht 4\' 10"  (1.473 m)   Wt 152 lb (68.9 kg)   LMP 06/24/2016   SpO2 100%   BMI 31.77 kg/m   Physical Exam  Constitutional: She is oriented to person, place, and time. She appears well-developed and well-nourished. No distress.  HENT:  Head: Normocephalic and atraumatic.  Right Ear: External ear normal.  Left Ear: External ear normal.  Nose: Nose normal.  Eyes: Conjunctivae and EOM are normal. No scleral icterus.  Neck: Normal range of motion and phonation normal.  Cardiovascular: Normal rate and regular rhythm.   Pulmonary/Chest: Effort normal. No stridor. No respiratory distress.  Abdominal: She exhibits no  distension.  Musculoskeletal: Normal range of motion. She exhibits no edema.       Right upper leg: She exhibits tenderness.       Legs: TTP starting from right gluteus sacral ileac muscle, lateral thighs, anterior thigh muscles and lateral ankle. Intact sensation   Neurological: She is alert and oriented to person, place, and time.  Skin: She is not diaphoretic.  Psychiatric: She has a normal mood and affect. Her behavior is normal.  Nursing note and vitals reviewed.    ED Treatments / Results  DIAGNOSTIC STUDIES: Oxygen Saturation is 100% on RA, normal by my interpretation.  COORDINATION OF CARE: 5:21 PM Discussed treatment plan with pt at bedside and pt agreed to plan.  Labs (all labs ordered are listed, but only abnormal results are displayed) Labs Reviewed - No data to display  EKG  EKG Interpretation None       Radiology No results found.  Procedures Procedures (including critical care time)  Medications Ordered in ED Medications - No data to display   Initial Impression / Assessment and Plan / ED Course  I have reviewed the triage vital signs and the nursing notes.  Pertinent labs & imaging results that were available during my care of the patient were reviewed by me and considered in my medical decision making (see chart for details).  Clinical Course     40 y.o. female presents with right leg pain s/p removal of femoral pin. No acute traumatic onset. No red flag symptoms of fever, weight loss, saddle anesthesia, weakness, fecal/urinary incontinence or urinary retention.   Suspect MSK etiology with sciatica. No indication for imaging emergently. Patient was recommended to continue with current regimen and add TENS therapy and engage in early mobility as definitive treatment..  Is instructed to follow-up with orthopedic surgery and seek referral to the rehabilitation or a chronic pain specialist.  Return precautions discussed for worsening or new concerning  symptoms.   The patient is safe for discharge with strict return precautions.   Final Clinical Impressions(s) / ED Diagnoses   Final diagnoses:  Right leg pain  Sciatica of right side   I personally performed the services described in this documentation, which was scribed in my presence. The recorded information has been reviewed and is accurate.  Disposition: Discharge  Condition: Good  I have discussed the results, Dx and Tx plan with the patient who expressed understanding and agree(s) with the plan. Discharge instructions discussed at great length. The patient was given strict return precautions who verbalized understanding of the instructions. No further questions at time of discharge.    Current Discharge Medication List      Follow Up: Esperanza Richters, PA-C [address] [place]  [id number] [phone]  Schedule an appointment as soon as possible for a visit  As needed      [name], MD 07/12/16 1730

## 2016-07-13 ENCOUNTER — Telehealth: Payer: Self-pay | Admitting: Medical

## 2016-07-13 DIAGNOSIS — M5440 Lumbago with sciatica, unspecified side: Principal | ICD-10-CM

## 2016-07-13 DIAGNOSIS — G8929 Other chronic pain: Secondary | ICD-10-CM

## 2016-07-13 NOTE — Telephone Encounter (Signed)
Please advise 

## 2016-07-13 NOTE — Telephone Encounter (Signed)
Relation to ZO:XWRUpt:self Call back number:[phone]   Reason for call:  Patient requesting a neurologist referral due to pinch nerve in right leg. Patient states PCP is aware and would like to be seen by specialist as soon as possible. Patient states she tried to make an appointment [facility] [place] but next available appointment would be in a month, patient would like to be seen sooner. Please advise

## 2016-07-14 ENCOUNTER — Ambulatory Visit (INDEPENDENT_AMBULATORY_CARE_PROVIDER_SITE_OTHER): Payer: Medicare Other | Admitting: Family Medicine

## 2016-07-14 ENCOUNTER — Encounter: Payer: Self-pay | Admitting: Family Medicine

## 2016-07-14 ENCOUNTER — Ambulatory Visit: Payer: Medicare Other | Admitting: Family Medicine

## 2016-07-14 DIAGNOSIS — M79604 Pain in right leg: Secondary | ICD-10-CM | POA: Diagnosis present

## 2016-07-14 NOTE — Patient Instructions (Signed)
We will set you up for an epidural steroid injection. I don't think a hip injection would help you. You have tried all the oral medications I would typically recommend (prednisone, anti-inflammatories, muscle relaxants, topical medicines, lyrica, tylenol). If you get benefit with the shots you can repeat these for a total of 3 spaced 2 weeks apart. Call me a week after the injection to let me know how you're doing.

## 2016-07-14 NOTE — Telephone Encounter (Signed)
Pt states she was told  one month with Laural BenesJohnson neurologist(did she take that appointment?). I am not sure if I can get her in any sooner. Does she actually have an appointment scheduled might be best for her to go ahead and keep that appointment.   I did go ahead and write referral asking for them to see her sooner.

## 2016-07-15 DIAGNOSIS — G479 Sleep disorder, unspecified: Secondary | ICD-10-CM | POA: Diagnosis not present

## 2016-07-15 DIAGNOSIS — Z885 Allergy status to narcotic agent status: Secondary | ICD-10-CM | POA: Diagnosis not present

## 2016-07-15 DIAGNOSIS — G894 Chronic pain syndrome: Secondary | ICD-10-CM | POA: Diagnosis not present

## 2016-07-15 DIAGNOSIS — G5710 Meralgia paresthetica, unspecified lower limb: Secondary | ICD-10-CM | POA: Diagnosis not present

## 2016-07-15 DIAGNOSIS — I10 Essential (primary) hypertension: Secondary | ICD-10-CM | POA: Diagnosis not present

## 2016-07-15 DIAGNOSIS — G8929 Other chronic pain: Secondary | ICD-10-CM | POA: Diagnosis not present

## 2016-07-15 DIAGNOSIS — M5441 Lumbago with sciatica, right side: Secondary | ICD-10-CM | POA: Diagnosis not present

## 2016-07-15 DIAGNOSIS — J45909 Unspecified asthma, uncomplicated: Secondary | ICD-10-CM | POA: Diagnosis not present

## 2016-07-15 DIAGNOSIS — M79604 Pain in right leg: Secondary | ICD-10-CM | POA: Diagnosis not present

## 2016-07-15 DIAGNOSIS — R51 Headache: Secondary | ICD-10-CM | POA: Diagnosis not present

## 2016-07-16 ENCOUNTER — Telehealth: Payer: Self-pay | Admitting: Medical

## 2016-07-16 ENCOUNTER — Other Ambulatory Visit: Payer: Self-pay | Admitting: Family Medicine

## 2016-07-16 ENCOUNTER — Telehealth: Payer: Self-pay | Admitting: Family Medicine

## 2016-07-16 DIAGNOSIS — M79604 Pain in right leg: Secondary | ICD-10-CM

## 2016-07-16 DIAGNOSIS — M5416 Radiculopathy, lumbar region: Secondary | ICD-10-CM

## 2016-07-16 HISTORY — DX: Pain in right leg: M79.604

## 2016-07-16 MED ORDER — CYCLOBENZAPRINE HCL 5 MG PO TABS: ORAL_TABLET | ORAL | 0 refills | Status: DC

## 2016-07-16 NOTE — Telephone Encounter (Signed)
Patient is calling to get a pain medication because her 1st injection is scheduled for  07/24/2016. She is in a lot of pain and is unable to sleep or anything. She would like to know if we can prescribe her something. Patient did not state why she was in pain but stated that she has been in and out of the hospital and Ramon Dredgedward knows her situation. Please advise.   Patient phone: [phone]

## 2016-07-16 NOTE — Telephone Encounter (Signed)
The only thing we could try is a prescription anti-inflammatory like diclofenac, meloxicam, or naproxen though these would increase her blood pressure.  Tylenol, topical medications (capsaicin, biofreeze) over the counter as well.  She's already tried different patches (lidocaine and flector).  I would not recommend narcotics and she cannot tolerate these anyway.

## 2016-07-16 NOTE — Telephone Encounter (Signed)
Please advise 

## 2016-07-16 NOTE — Assessment & Plan Note (Signed)
We discussed circumferential distribution not typical with a single nerve irritation (S1 based on MRI from May) though possible this is the cause of her pain.  I do not think she has hip pathology, bursitis concurrent with this - we discussed I do not feel bursal or hip injections would help her.  I doubt she would tolerate physical therapy at this point either.  PM&R physician had mentioned she's a candidate for ESI but she never tried this - she would like to go ahead so will set this up.  Call us a week following this injection.  Keep appointment with neurology as well.  NCV/EMGs a consideration as well (she reports having these remotely but I do not see them in the chart).  Continue lyrica.

## 2016-07-16 NOTE — Telephone Encounter (Signed)
I did send in cyclobenzaprene to her pharmacy. in event having muscle pain/cramping component.  . Also advise her regarding as stated on other note I sent you. Be seen tomorrow in office by doc of the day or who has availability? if needed or ED.(Note if pt bp was controlled/lower side toradol  Injection may be option to get her some relief?)

## 2016-07-16 NOTE — Telephone Encounter (Signed)
Called [facility] and they were going to call patient today and schedule appointment.

## 2016-07-16 NOTE — Telephone Encounter (Signed)
I spoke with the patient and she voices understanding. She states that she would call the office if her pain is not better by tomorrow 07/17/16 and the patient states that she would.

## 2016-07-16 NOTE — Telephone Encounter (Signed)
I reviewed recent note from Dr. Pearletha ForgeHudnall and his recent recommendations. Pt bp has been high(nsaids can increase bp as well) and she can't tolerate narcotics.  I can only recommend low dose muscle relaxant. This may help if source of pain is muscle. I reviewed med list and did not see muscle relaxant. Will rx but just take at night.  Tomorrow if still in pain then maybe doc of the day can evaluate or ED evaluation. Since I am not in the office.

## 2016-07-16 NOTE — Progress Notes (Signed)
PCP: Esperanza RichtersSaguier, Edward, PA-C Consultation requested by Malva Coganody Martin PA-C  Subjective:   HPI: Patient is a 40 y.o. female here for right leg pain.  Patient has 4 month history of right leg pain. Pain is 10/10 level, sharp and burning. Radiates from buttocks down to ankle circumferentially. Difficulty sleeping, using restroom due to pain. Has tried heat, ice, tylenol arthritis, prednisone (has 1 more day of this medication to take), lyrica 200 bid, flector patch, tramadol, lidocaine patches, TENS unit. No bowel/bladder dysfunction. No skin changes. Doppler u/s on 11/2 negative for DVT She recently had hardware removed from this hip from orthopedics (in place for 10 years). 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 She has a neurologist but next appointment there is about a month from now, no availability sooner.  Past Medical History:  Diagnosis Date  . Depression   . Femur fracture (HCC) 2006  . Hypertension   . Migraine   . Mood disorder [facility](HCC)     Current Outpatient Prescriptions on File Prior to Visit  Medication Sig Dispense Refill  . Butalbital-APAP-Caffeine (FIORICET) 50-300-40 MG CAPS 1 tab po q 8 hrs as needed HA 30 capsule 0  . fluticasone (FLONASE) 50 MCG/ACT nasal spray Place 2 sprays into both nostrils daily. 16 g 1  . ondansetron (ZOFRAN ODT) 8 MG disintegrating tablet Take 1 tablet (8 mg total) by mouth every 8 (eight) hours as needed for nausea or vomiting. 15 tablet 0  . pregabalin (LYRICA) 200 MG capsule Take 1 capsule (200 mg total) by mouth 2 (two) times daily. 60 capsule 1  . SUMAtriptan (IMITREX) 50 MG tablet .1 tab po at onset of ha. Repeat in 2 hours if ha persists(max number of tabs is 2 in any 24 hour period) 10 tablet 0   No current facility-administered medications on file prior to visit.     Past Surgical History:  Procedure Laterality Date  . APPENDECTOMY    . CHOLECYSTECTOMY    .  FEMUR FRACTURE SURGERY Right 2006  . FEMUR IM ROD REMOVAL Right 03/11/2016  . TUBAL LIGATION      Allergies  Allergen Reactions  . Tramadol Shortness Of Breath  . Hydrocodone     Social History   Social History  . Marital status: Legally Separated    Spouse name: N/A  . Number of children: N/A  . Years of education: N/A   Occupational History  . Not on file.   Social History Main Topics  . Smoking status: Current Every Day Smoker    Types: Cigars  . Smokeless tobacco: Never Used  . Alcohol use No  . Drug use:     Types: Marijuana  . Sexual activity: Yes    Birth control/ protection: Other-see comments, Surgical   Other Topics Concern  . Not on file   Social History Narrative  . No narrative on file    No family history on file.  BP (!) 164/117   Pulse 82   Ht 4\' 10"  (1.473 m)   Wt 150 lb (68 kg)   LMP 06/24/2016   BMI 31.35 kg/m   Review of Systems: See HPI above.     Objective:  Physical Exam:  Gen: NAD, comfortable in exam room  Back/right hip: No gross deformity, scoliosis. TTP diffusely very low lumbar spine, hip, buttock, thigh, into calf.  No midline or bony TTP. FROM hip. Strength LEs 5/5 all muscle groups.  Trace MSRs in patellar and achilles tendons, equal bilaterally. Negative SLRs. Sensation diminished to light touch throughout right leg. Negative logroll bilateral hips Negative fabers and piriformis stretches.   Assessment & Plan:  1. Right leg pain - We discussed circumferential distribution not typical with a single nerve irritation (S1 based on MRI from May) though possible this is the cause of her pain.  I do not think she has hip pathology, bursitis concurrent with this - we discussed I do not feel bursal or hip injections would help her.  I doubt she would tolerate physical therapy at this point either.  PM&R physician had mentioned she's a candidate for ESI but she never tried this - she would like to go ahead so will set this  up.  Call us a week following this injection.  Keep appointment with neurology as well.  NCV/EMGs a consideration as well (she reports having these remotely but I do not see them in the chart).  Continue lyrica.

## 2016-07-17 ENCOUNTER — Ambulatory Visit (INDEPENDENT_AMBULATORY_CARE_PROVIDER_SITE_OTHER): Payer: Medicare Other | Admitting: Physician Assistant

## 2016-07-17 ENCOUNTER — Encounter: Payer: Self-pay | Admitting: Physician Assistant

## 2016-07-17 VITALS — BP 128/88 | HR 82 | Temp 98.5°F | Resp 16 | Ht <= 58 in | Wt 158.1 lb

## 2016-07-17 DIAGNOSIS — M5416 Radiculopathy, lumbar region: Secondary | ICD-10-CM

## 2016-07-17 MED ORDER — OXYCODONE-ACETAMINOPHEN 5-325 MG PO TABS: 1.0000 | ORAL_TABLET | Freq: Three times a day (TID) | ORAL | 0 refills | Status: DC | PRN

## 2016-07-17 MED ORDER — CYCLOBENZAPRINE HCL 10 MG PO TABS: 10.0000 mg | ORAL_TABLET | Freq: Three times a day (TID) | ORAL | 0 refills | Status: DC | PRN

## 2016-07-17 NOTE — Patient Instructions (Signed)
Please take pain medication and cyclobenzaprine (new dose) as directed. Continue home stretches. We are trying to get your injection moved up.  I will call you once I have heard something.

## 2016-07-17 NOTE — Progress Notes (Signed)
Pre visit review using our clinic review tool, if applicable. No additional management support is needed unless otherwise documented below in the visit note/SLS  

## 2016-07-17 NOTE — Progress Notes (Signed)
Patient presents to clinic today for ER follow-up. Patient was seen in [facility] on 07/15/16 for continued R-sided low back pain with sciatica. Patient was [facility] and started on steroid taper. Had follow-up scheduled with this provider on 07/10/16 where Lyrica dose was increased to supplement steroid taper to help with symptoms. Patient endorses symptoms were improving but then flared up on the 8th so she went to the ER. Care Everywhere notes reviewed. No further workup performed in the ER. Patient was given pain medication to alleviate symptoms. Discharged on Flexeril and Percocet. Endorses symptoms improved with medications given. Is feeling better today, able to ambulate with less difficulty. Noted pain still radiating into RLE. Denies saddle anesthesia or change to bowel/bladder habits. Has appointment scheduled with Neurology on 11/17 for her Epidural injection and a follow-up with specialist on 08/10/16 for assessment after procedure.   Past Medical History:  Diagnosis Date  . Depression   . Femur fracture (HCC) 2006  . Hypertension   . Migraine   . Mood disorder [facility](HCC)     Current Outpatient Prescriptions on File Prior to Visit  Medication Sig Dispense Refill  . Butalbital-APAP-Caffeine (FIORICET) 50-300-40 MG CAPS 1 tab po q 8 hrs as needed HA 30 capsule 0  . cyclobenzaprine (FLEXERIL) 5 MG tablet 1 tab po q hs as needed muscle spasms. 10 tablet 0  . fluticasone (FLONASE) 50 MCG/ACT nasal spray Place 2 sprays into both nostrils daily. 16 g 1  . ondansetron (ZOFRAN ODT) 8 MG disintegrating tablet Take 1 tablet (8 mg total) by mouth every 8 (eight) hours as needed for nausea or vomiting. 15 tablet 0  . pregabalin (LYRICA) 200 MG capsule Take 1 capsule (200 mg total) by mouth 2 (two) times daily. 60 capsule 1  . SUMAtriptan (IMITREX) 50 MG tablet .1 tab po at onset of ha. Repeat in 2 hours if ha persists(max number of tabs is 2 in any 24 hour period) 10 tablet 0     No current facility-administered medications on file prior to visit.     Allergies  Allergen Reactions  . Tramadol Shortness Of Breath  . Hydrocodone     No family history on file.  Social History   Social History  . Marital status: Legally Separated    Spouse name: N/A  . Number of children: N/A  . Years of education: N/A   Social History Main Topics  . Smoking status: Current Every Day Smoker    Types: Cigars  . Smokeless tobacco: Never Used  . Alcohol use No  . Drug use:     Types: Marijuana  . Sexual activity: Yes    Birth control/ protection: Other-see comments, Surgical   Other Topics Concern  . None   Social History Narrative  . None   Review of Systems - See HPI.  All other ROS are negative.  BP 128/88 (BP Location: Left Arm, Patient Position: Sitting, Cuff Size: Normal)   Pulse 82   Temp 98.5 F (36.9 C) (Oral)   Resp 16   Ht 4\' 10"  (1.473 m)   Wt 158 lb 2 oz (71.7 kg)   LMP 06/24/2016   SpO2 99%   BMI 33.05 kg/m   Physical Exam  Constitutional: She is oriented to person, place, and time and well-developed, well-nourished, and in no distress.  HENT:  Head: Normocephalic and atraumatic.  Eyes: Conjunctivae are normal.  Cardiovascular: Normal rate, regular rhythm, normal heart sounds  and intact distal pulses.   Pulmonary/Chest: Effort normal.  Musculoskeletal:       Lumbar back: She exhibits pain. She exhibits normal range of motion, no tenderness and no bony tenderness.  Neurological: She is alert and oriented to person, place, and time.  Skin: Skin is warm and dry.  Psychiatric: Affect normal.  Vitals reviewed.  Assessment/Plan: 1. Lumbar radiculopathy Chronic. Has specialist with scheduled appointment for procedure for pain relief. Will call and see if there is any possibility of moving the appointment up. Patient doing better today. Continue increased Lyrica dosing. Just finished steroid pack so will avoid repeat for now giving improved  symptoms. Rx Percocet refilled. Patient with allergy to Tramadol and Hydrocodone but just finished ER Rx for percocet without any side effect. Limited 5-day supply given. She will need to FU with PCP for any refills prior to procedure. Alarm signs/symtpoms reviewed that would prompt need for repeat visit to ER.    Piedad ClimesMartin, Bryam Taborda Cody, PA-C

## 2016-07-24 ENCOUNTER — Ambulatory Visit
Admission: RE | Admit: 2016-07-24 | Discharge: 2016-07-24 | Disposition: A | Discharge status: Home / Self Care | Payer: Medicare Other | Source: Ambulatory Visit | Attending: Family Medicine | Admitting: Family Medicine

## 2016-07-24 VITALS — BP 182/108 | HR 87

## 2016-07-24 DIAGNOSIS — M47817 Spondylosis without myelopathy or radiculopathy, lumbosacral region: Secondary | ICD-10-CM | POA: Diagnosis not present

## 2016-07-24 DIAGNOSIS — M5416 Radiculopathy, lumbar region: Secondary | ICD-10-CM

## 2016-07-24 DIAGNOSIS — M79604 Pain in right leg: Secondary | ICD-10-CM

## 2016-07-24 MED ORDER — IOPAMIDOL (ISOVUE-M 200) INJECTION 41%
1.0000 mL | Freq: Once | INTRAMUSCULAR | Status: AC
  Administered 2016-07-24: 1 mL via EPIDURAL

## 2016-07-24 MED ORDER — METHYLPREDNISOLONE ACETATE 40 MG/ML INJ SUSP (RADIOLOG
120.0000 mg | Freq: Once | INTRAMUSCULAR | Status: AC
  Administered 2016-07-24: 120 mg via EPIDURAL

## 2016-07-24 NOTE — Discharge Instructions (Signed)

## 2016-08-03 ENCOUNTER — Telehealth: Payer: Self-pay | Admitting: Medical

## 2016-08-03 ENCOUNTER — Telehealth: Payer: Self-pay | Admitting: Family Medicine

## 2016-08-03 NOTE — Telephone Encounter (Signed)
Ok to order second ESI at same level (right L5-S1).  Thanks!

## 2016-08-03 NOTE — Telephone Encounter (Signed)
I do not order injections. Per chart review, Dr. Pearletha ForgeHudnall is the ordering/performing provider.  She would need to contact him at [facility].

## 2016-08-03 NOTE — Telephone Encounter (Signed)
Returned call to patient and informed her to contact Dr. Pearletha ForgeHudnall since he was the initial ordering provider.  Patient voiced her understanding and states she will call Dr. Lazaro ArmsHudnall's office now.

## 2016-08-03 NOTE — Telephone Encounter (Signed)
Patient transferred to [facility] office from [facility], she states when she was last seen by Estill Bambergoady Martin, PA she was [facility] injection that was performed on 07/24/16.  Patients she does not feel any better and [facility].  She was advised to contact the ordering provider to request another order for a 2nd injection.

## 2016-08-04 NOTE — Telephone Encounter (Signed)
Order sent to [facility].  

## 2016-08-05 ENCOUNTER — Telehealth: Payer: Self-pay | Admitting: Family Medicine

## 2016-08-05 ENCOUNTER — Telehealth: Payer: Self-pay | Admitting: Physician Assistant

## 2016-08-05 ENCOUNTER — Other Ambulatory Visit: Payer: Self-pay | Admitting: Family Medicine

## 2016-08-05 ENCOUNTER — Telehealth: Payer: Self-pay | Admitting: Medical

## 2016-08-05 DIAGNOSIS — M5416 Radiculopathy, lumbar region: Secondary | ICD-10-CM

## 2016-08-05 NOTE — Telephone Encounter (Signed)
Indication for chronic opioid: Lumbar Radiculopathy Medication and dose: Oxycodone-Acetaminophen 5/325 # pills per month: # 20 on 07/17/16 Last UDS date: none Pain contract signed (Y/N): No Date narcotic database last reviewed (include red flags):

## 2016-08-05 NOTE — Telephone Encounter (Signed)
Spoke to [facility] and they were placing call to patient to set up appointment.

## 2016-08-05 NOTE — Telephone Encounter (Signed)
Pt needs appointment. Cody rx oxycodone. I had her on other regimen. So please schedule for this Friday. This level of controlled med needs office visit. In the past I had rx'd tramadol and lyrica.

## 2016-08-05 NOTE — Telephone Encounter (Signed)
She would need to request from her primary care provider or her specialist.  Saw her as an acute visit (ER follow-up) in PCP absence.

## 2016-08-05 NOTE — Telephone Encounter (Signed)
Pt called her pcp office and was [facility] since he was the one who gave pt this before. Pt states that she is having a 2nd injection on 12/11 and asking if Selena BattenCody will give enough Oxycodone until then.

## 2016-08-05 NOTE — Telephone Encounter (Signed)
Spoke with patient advised she needs to [facility]. [facility]. Patient is willing to be seen by [name] for rx until her appt for her 2nd injection.

## 2016-08-06 ENCOUNTER — Ambulatory Visit (INDEPENDENT_AMBULATORY_CARE_PROVIDER_SITE_OTHER): Payer: Medicare Other | Admitting: Medical

## 2016-08-06 ENCOUNTER — Ambulatory Visit: Payer: Medicare Other | Admitting: Medical

## 2016-08-06 VITALS — BP 150/100 | HR 77 | Resp 16 | Ht <= 58 in | Wt 163.2 lb

## 2016-08-06 DIAGNOSIS — M5416 Radiculopathy, lumbar region: Secondary | ICD-10-CM

## 2016-08-06 MED ORDER — OXYCODONE-ACETAMINOPHEN 5-325 MG PO TABS: 1.0000 | ORAL_TABLET | Freq: Three times a day (TID) | ORAL | 0 refills | Status: DC | PRN

## 2016-08-06 NOTE — Progress Notes (Signed)
Subjective:    Patient ID: Phyllis Adams, female    DOB: 01/06/1976, 40 y.o.   MRN: [id number]  HPI   Pt in for follow up.   Pt states recent epidural injection helped her for 5 days. But then pain came back. Next epidural is December the 1, 2017. Pt is not taking any pain medication presently. Pt has seen sports medicine. Pt has severe pain that prevents her from driving and working. Pt for short time surgery on rt thigh after was able to work. Got some better after rod removed from her thigh/femur.   Pt now describes rt lower back pain with sciatica distribution. Some pain radiates all the way to her ankle.  Pt on CT had l4-l5 and l5-s1 disc bulge.  Pt tells me specialist associated with [facility] ordered mri. I don't have that report.  Pt had reaction to codeine in the past. Pt states tramadol causes hot flash sensation and nausea.  Pt had no reaction to oxycodone. Pt could relax and sleep with pain relief. Pain level decrease to level 4/10. Pt is still on lyrica.   She has been on ibuprofen excessively since off oxycodone since 17 th.   Pt has appointment with neuosurgeon on December 4th.  LMP- 2 wks ago.    Review of Systems  Constitutional: Negative for chills, fatigue and fever.  Respiratory: Negative for cough, chest tightness, shortness of breath and wheezing.   Cardiovascular: Negative for chest pain and palpitations.  Gastrointestinal: Negative for abdominal pain.  Musculoskeletal: Positive for back pain.  Neurological: Negative for dizziness, speech difficulty, numbness and headaches.       Radicular pain  Hematological: Negative for adenopathy. Does not bruise/bleed easily.  Psychiatric/Behavioral: Negative for behavioral problems and confusion.   Past Medical History:  Diagnosis Date  . Depression   . Femur fracture (HCC) 2006  . Hypertension   . Migraine   . Mood disorder [facility](HCC)      Social History   Social History  . Marital status: Legally  Separated    Spouse name: N/A  . Number of children: N/A  . Years of education: N/A   Occupational History  . Not on file.   Social History Main Topics  . Smoking status: Current Every Day Smoker    Types: Cigars  . Smokeless tobacco: Never Used  . Alcohol use No  . Drug use:     Types: Marijuana  . Sexual activity: Yes    Birth control/ protection: Other-see comments, Surgical   Other Topics Concern  . Not on file   Social History Narrative  . No narrative on file    Past Surgical History:  Procedure Laterality Date  . APPENDECTOMY    . CHOLECYSTECTOMY    . FEMUR FRACTURE SURGERY Right 2006  . FEMUR IM ROD REMOVAL Right 03/11/2016  . TUBAL LIGATION      No family history on file.  Allergies  Allergen Reactions  . Tramadol Shortness Of Breath and Nausea And Vomiting  . Hydrocodone     Current Outpatient Prescriptions on File Prior to Visit  Medication Sig Dispense Refill  . Butalbital-APAP-Caffeine (FIORICET) 50-300-40 MG CAPS 1 tab po q 8 hrs as needed HA 30 capsule 0  . cyclobenzaprine (FLEXERIL) 10 MG tablet Take 1 tablet (10 mg total) by mouth 3 (three) times daily as needed for muscle spasms. 30 tablet 0  . fluticasone (FLONASE) 50 MCG/ACT nasal spray Place 2 sprays into both nostrils daily. 16 g 1  .  ondansetron (ZOFRAN ODT) 8 MG disintegrating tablet Take 1 tablet (8 mg total) by mouth every 8 (eight) hours as needed for nausea or vomiting. 15 tablet 0  . oxyCODONE-acetaminophen (ROXICET) 5-325 MG tablet Take 1 tablet by mouth every 8 (eight) hours as needed for severe pain. 20 tablet 0  . pregabalin (LYRICA) 200 MG capsule Take 1 capsule (200 mg total) by mouth 2 (two) times daily. 60 capsule 1  . SUMAtriptan (IMITREX) 50 MG tablet .1 tab po at onset of ha. Repeat in 2 hours if ha persists(max number of tabs is 2 in any 24 hour period) 10 tablet 0  . diclofenac (VOLTAREN) 75 MG EC tablet TAKE 1 TABLET BY MOUTH TWICE DAILY (Patient not taking: Reported on  08/06/2016) 30 tablet 0   No current facility-administered medications on file prior to visit.     BP (!) 150/100 (BP Location: Left Arm, Patient Position: Standing, Cuff Size: Large)   Pulse 77   Resp 16   Ht 4\' 10"  (1.473 m)   Wt 163 lb 3.2 oz (74 kg)   LMP 07/29/2016 (Approximate)   SpO2 95%   BMI 34.11 kg/m       Objective:   Physical Exam  General Appearance- Not in acute distress. But in pain    Chest and Lung Exam Auscultation: Breath sounds:-Normal. Clear even and unlabored. Adventitious sounds:- No Adventitious sounds.  Cardiovascular Auscultation:Rythm - Regular, rate and rythm. Heart Sounds -Normal heart sounds.  Abdomen Inspection:-Inspection Normal.  Palpation/Perucssion: Palpation and Percussion of the abdomen reveal- Non Tender, No Rebound tenderness, No rigidity(Guarding) and No Palpable abdominal masses.  Liver:-Normal.  Spleen:- Normal.   Back Mid lumbar spine tenderness to palpation and rt si pain. L5-s1 sensation intact. Pt did not want to sit or lie down for exam due to severe pain.  Lower ext neurologic  L5-S1 sensation intact bilaterally. No foot drop bilaterally.      Assessment & Plan:  For your severe pain I will rx oxycodone and continue lyrica. Continue epidural injections and see neurosurgeon.  in one month if not good response to epidural or no surgical plan then will present plan of oxycodone monthly basis to supervising MD. Will get you to sign contract today. But keep in mind will discuss with Dr. Laury AxonLowne.  If any red flag signs/symptoms occur then ED evaluation.  Follow up in one month or as needed   Cleta Heatley, [name], [place]-C

## 2016-08-06 NOTE — Progress Notes (Signed)
Pre visit review using our clinic review tool, if applicable. No additional management support is needed unless otherwise documented below in the visit note. 

## 2016-08-06 NOTE — Patient Instructions (Addendum)
For your severe pain I will rx oxycodone and continue lyrica. Continue epidural injections and see neurosurgeon.  In one month if not good response to epidural or no surgical plan then will present plan of oxycodone monthly basis to supervising MD. Will get you to sign contract today. But keep in mind will discuss with Dr. Laury AxonLowne.  If any red flag signs/symptoms occur then ED evaluation.  Follow up in one month or as needed   No more ibuprofen today or tomorrow. Can get toradol 60 mg im nurse visit tomorrow.

## 2016-08-06 NOTE — Telephone Encounter (Signed)
Patient scheduled for 4pm 08/06/16 with [name]

## 2016-08-07 ENCOUNTER — Ambulatory Visit (INDEPENDENT_AMBULATORY_CARE_PROVIDER_SITE_OTHER): Payer: Medicare Other

## 2016-08-07 DIAGNOSIS — M5441 Lumbago with sciatica, right side: Secondary | ICD-10-CM

## 2016-08-07 DIAGNOSIS — M5442 Lumbago with sciatica, left side: Secondary | ICD-10-CM

## 2016-08-07 MED ORDER — KETOROLAC TROMETHAMINE 60 MG/2ML IM SOLN
60.0000 mg | Freq: Once | INTRAMUSCULAR | Status: AC
  Administered 2016-08-07: 60 mg via INTRAMUSCULAR

## 2016-08-07 NOTE — Progress Notes (Signed)
Pre visit review using our clinic tool,if applicable. No additional management support is needed unless otherwise documented below in the visit note.   Patient in for Toradol 60 mg IM injection per E. [name],PA-C

## 2016-08-10 DIAGNOSIS — M5126 Other intervertebral disc displacement, lumbar region: Secondary | ICD-10-CM | POA: Diagnosis not present

## 2016-08-10 DIAGNOSIS — M5416 Radiculopathy, lumbar region: Secondary | ICD-10-CM | POA: Diagnosis not present

## 2016-08-10 DIAGNOSIS — M533 Sacrococcygeal disorders, not elsewhere classified: Secondary | ICD-10-CM | POA: Diagnosis not present

## 2016-08-10 DIAGNOSIS — M1288 Other specific arthropathies, not elsewhere classified, other specified site: Secondary | ICD-10-CM | POA: Diagnosis not present

## 2016-08-17 ENCOUNTER — Ambulatory Visit
Admission: RE | Admit: 2016-08-17 | Discharge: 2016-08-17 | Disposition: A | Discharge status: Home / Self Care | Payer: Medicare Other | Source: Ambulatory Visit | Attending: Family Medicine | Admitting: Family Medicine

## 2016-08-17 DIAGNOSIS — M47817 Spondylosis without myelopathy or radiculopathy, lumbosacral region: Secondary | ICD-10-CM | POA: Diagnosis not present

## 2016-08-17 DIAGNOSIS — M5416 Radiculopathy, lumbar region: Secondary | ICD-10-CM

## 2016-08-17 MED ORDER — IOPAMIDOL (ISOVUE-M 200) INJECTION 41%
1.0000 mL | Freq: Once | INTRAMUSCULAR | Status: AC
  Administered 2016-08-17: 1 mL via EPIDURAL

## 2016-08-17 MED ORDER — METHYLPREDNISOLONE ACETATE 40 MG/ML INJ SUSP (RADIOLOG
120.0000 mg | Freq: Once | INTRAMUSCULAR | Status: AC
  Administered 2016-08-17: 120 mg via EPIDURAL

## 2016-08-17 NOTE — Discharge Instructions (Signed)

## 2016-08-18 ENCOUNTER — Other Ambulatory Visit: Payer: Self-pay | Admitting: Medical

## 2016-08-19 ENCOUNTER — Other Ambulatory Visit: Payer: Self-pay | Admitting: Medical

## 2016-08-21 ENCOUNTER — Other Ambulatory Visit: Payer: Self-pay | Admitting: Medical

## 2016-08-23 ENCOUNTER — Other Ambulatory Visit: Payer: Self-pay | Admitting: Medical

## 2016-08-25 ENCOUNTER — Other Ambulatory Visit: Payer: Self-pay | Admitting: Medical

## 2016-08-25 ENCOUNTER — Other Ambulatory Visit: Payer: Self-pay | Admitting: *Deleted

## 2016-08-25 MED ORDER — SUMATRIPTAN SUCCINATE 50 MG PO TABS: ORAL_TABLET | ORAL | 0 refills | Status: DC

## 2016-08-25 MED ORDER — BUTALBITAL-APAP-CAFFEINE 50-300-40 MG PO CAPS: ORAL_CAPSULE | ORAL | 0 refills | Status: DC

## 2016-08-25 NOTE — Telephone Encounter (Signed)
Refill sent per [facility] refill protocol/SLS  

## 2016-08-26 ENCOUNTER — Telehealth: Payer: Self-pay | Admitting: *Deleted

## 2016-08-26 NOTE — Telephone Encounter (Signed)
Scheduled awv for 09/10/16 @10 .

## 2016-08-26 NOTE — Telephone Encounter (Signed)
I want pt to follow up with me next week. Won't write fioricet tablet just yet. Want to talk with her about use. Since she is now on percocet. Also see how she is doing with her back pain.

## 2016-08-26 NOTE — Telephone Encounter (Signed)
Received fax from pharmacy stating that Insurance will not cover Fioricet in Capsule form; request Rx to be changed to Tablet form; Please Advise/SLS 12/20

## 2016-08-26 NOTE — Telephone Encounter (Signed)
I sent in zofran for patient. I appears butalbital is on her med list and refilled yesterday. I don't remember refilling butalbital. In fact I question if that is a good idea now that she is on percocet. So will you check if other staff sent this in. How is on med list. It appears I rx'd in march of past year but I don't remember refilling recently. Please let me know what you find out.

## 2016-08-28 ENCOUNTER — Telehealth: Payer: Self-pay | Admitting: Medical

## 2016-08-28 NOTE — Telephone Encounter (Addendum)
FYI:  Patient informed that provider will need to see her before px requested Medication d/t her usage of Percocet and her last 3 BPs have been over the 135/85 mark for this type of medication, pt has appt schedule for Friday, 09/04/16 and informed to use Ibuprofen and/or Naproxen for H/A pain [d/t Percocet use]; understood & agreed/SLS 12/22

## 2016-08-28 NOTE — Telephone Encounter (Signed)
[facility] and informed that patient will not be able to recive medication until she has been seen in office next week, patient is aware; provider is aware that Rx needs to be sent in in Tablet form for Insurance coverage. Pharmacy understood & agreed; they will cancel current order/SLS 12/22

## 2016-08-28 NOTE — Telephone Encounter (Signed)
[facility] [phone] - [place], [place] - [address] [phone] (Phone) [phone] (Fax)    Reason for call:  Insurance doesn't cover Butalbital-APAP-Caffeine (FIORICET) 50-300-40 MG CAPS, pharmacy requesting change Rx to fioricet 325mg  tablets, please advsie

## 2016-08-28 NOTE — Telephone Encounter (Signed)
Fioricet filled to pharmacy 08/25/16 by Burnard LeighSharon Scates, CMA per chart review. Capsules not covered by insurance, and you did not want to change to tablet prior to seeing pt. She has f/u appt 09/04/16.

## 2016-09-04 ENCOUNTER — Ambulatory Visit (INDEPENDENT_AMBULATORY_CARE_PROVIDER_SITE_OTHER): Payer: Medicare Other | Admitting: Medical

## 2016-09-04 VITALS — HR 84 | Temp 98.5°F | Ht <= 58 in | Wt 158.8 lb

## 2016-09-04 DIAGNOSIS — G8929 Other chronic pain: Secondary | ICD-10-CM

## 2016-09-04 DIAGNOSIS — M545 Low back pain: Secondary | ICD-10-CM

## 2016-09-04 LAB — COMPREHENSIVE METABOLIC PANEL
ALT: 9 U/L (ref 0–35)
AST: 11 U/L (ref 0–37)
Albumin: 4.1 g/dL (ref 3.5–5.2)
Alkaline Phosphatase: 67 U/L (ref 39–117)
BUN: 8 mg/dL (ref 6–23)
CALCIUM: 9.3 mg/dL (ref 8.4–10.5)
CHLORIDE: 105 meq/L (ref 96–112)
CO2: 28 meq/L (ref 19–32)
CREATININE: 0.61 mg/dL (ref 0.40–1.20)
GFR: 138.78 mL/min (ref >60.00)
Glucose, Bld: 94 mg/dL (ref 70–99)
Potassium: 3.3 mEq/L — ABNORMAL LOW (ref 3.5–5.1)
Sodium: 139 mEq/L (ref 135–145)
Total Bilirubin: 0.3 mg/dL (ref 0.2–1.2)
Total Protein: 7.2 g/dL (ref 6.0–8.3)

## 2016-09-04 MED ORDER — IBUPROFEN 800 MG PO TABS: 800.0000 mg | ORAL_TABLET | Freq: Three times a day (TID) | ORAL | 1 refills | Status: DC | PRN

## 2016-09-04 MED ORDER — OXYCODONE-ACETAMINOPHEN 5-325 MG PO TABS: 1.0000 | ORAL_TABLET | Freq: Three times a day (TID) | ORAL | 0 refills | Status: DC | PRN

## 2016-09-04 NOTE — Patient Instructions (Addendum)
For your back pain continue the lyrica and I am prescribing 800 mg motrin. I do want to check your kidney function today since you will use nsaid.  I am happy to hear your pain is a lot better. I am going to write limited number of percocet in event you get flare. If no flare over next month will dc med.  Note to return to work but only 20 hours per week for next 2 weeks. Will see if you can tolerate this. Then decide if you can work full time. If you do part time but have issues with pain please let us know before 2 weeks.   For migraine ha would recommend using imitrex first. Appears staff sent in fiocet. I would use this only as back up if imitrex does not help. But keep in mind severe ha or any HA with deficits would need ED evaluation.  Follow up in 2 months or as needed

## 2016-09-04 NOTE — Progress Notes (Signed)
Pre visit review using our clinic tool,if applicable. No additional management support is needed unless otherwise documented below in the visit note.  

## 2016-09-04 NOTE — Progress Notes (Signed)
Subjective:     Patient ID: Phyllis Adams, female    DOB: 07/28/1976, 40 y.o.   MRN: [id number]  HPI  Pt in for follow up.   Pt give report that she got second epidural injection on August 17, 2016. She states this helped her a lot. She states last pain tablet on August 14, 2016. Pt states she can take ibuprofen at 800 mg dose and it does help as well. Pt has know herniated disk history and known hx of some rt leg pain related to metal rod in leg which she recently had removed past year.  Pt is on lyrica 200 mg twice daily. This is helping as well.  Pt states has some migraine ha with menses. Pt will use fiorcet when he menses come on. Pt had wanted fiorcet but I wanted to not refill that without talking to her first since she was on percocet. I gave instruction to staff to not fill. But it looks like staff may have filled the fiorcet. Pt also has imtrex available. She had been using fiorcet first when ha came on rather than imitrex.  Pt feels like she may be able to  go back to work part time. She wants to try part time  first presently.   LMP- Pt states this month. She had tubal ligation.  Pt states she has not been scheduled for next epidural.       Review of Systems  Constitutional: Negative for chills, fatigue and fever.  Respiratory: Negative for cough, chest tightness and wheezing.   Cardiovascular: Negative for chest pain and palpitations.  Gastrointestinal: Negative for abdominal pain, nausea and vomiting.  Genitourinary: Negative for dysuria, flank pain, hematuria and urgency.  Musculoskeletal: Positive for back pain.       See hpi.  Neurological: Negative for dizziness, weakness, numbness and headaches.    Past Medical History:  Diagnosis Date  . Depression   . Femur fracture (HCC) 2006  . Hypertension   . Migraine   . Mood disorder [facility](HCC)      Social History   Social History  . Marital status: Legally Separated    Spouse name: N/A  . Number of  children: N/A  . Years of education: N/A   Occupational History  . Not on file.   Social History Main Topics  . Smoking status: Current Every Day Smoker    Types: Cigars  . Smokeless tobacco: Never Used  . Alcohol use No  . Drug use:     Types: Marijuana  . Sexual activity: Yes    Birth control/ protection: Other-see comments, Surgical   Other Topics Concern  . Not on file   Social History Narrative  . No narrative on file    Past Surgical History:  Procedure Laterality Date  . APPENDECTOMY    . CHOLECYSTECTOMY    . FEMUR FRACTURE SURGERY Right 2006  . FEMUR IM ROD REMOVAL Right 03/11/2016  . TUBAL LIGATION      No family history on file.  Allergies  Allergen Reactions  . Tramadol Shortness Of Breath and Nausea And Vomiting  . Hydrocodone     Current Outpatient Prescriptions on File Prior to Visit  Medication Sig Dispense Refill  . Butalbital-APAP-Caffeine (FIORICET) 50-300-40 MG CAPS 1 tab po q 8 hrs as needed HA 30 capsule 0  . cyclobenzaprine (FLEXERIL) 10 MG tablet Take 1 tablet (10 mg total) by mouth 3 (three) times daily as needed for muscle spasms. 30 tablet 0  .  diclofenac (VOLTAREN) 75 MG EC tablet TAKE 1 TABLET BY MOUTH TWICE DAILY 30 tablet 0  . fluticasone (FLONASE) 50 MCG/ACT nasal spray Place 2 sprays into both nostrils daily. 16 g 1  . ondansetron (ZOFRAN-ODT) 8 MG disintegrating tablet DISSOLVE 1 TABLET(8 MG) ON THE TONGUE EVERY 8 HOURS AS NEEDED FOR NAUSEA OR VOMITING 15 tablet 0  . ondansetron (ZOFRAN-ODT) 8 MG disintegrating tablet DISSOLVE 1 TABLET(8 MG) ON THE TONGUE EVERY 8 HOURS AS NEEDED FOR NAUSEA OR VOMITING 15 tablet 0  . oxyCODONE-acetaminophen (ROXICET) 5-325 MG tablet Take 1 tablet by mouth every 8 (eight) hours as needed for severe pain. 60 tablet 0  . pregabalin (LYRICA) 200 MG capsule Take 1 capsule (200 mg total) by mouth 2 (two) times daily. 60 capsule 1  . SUMAtriptan (IMITREX) 50 MG tablet .1 tab po at onset of ha. Repeat in 2  hours if ha persists(max number of tabs is 2 in any 24 hour period) 10 tablet 0   No current facility-administered medications on file prior to visit.     Pulse 84   Temp 98.5 F (36.9 C) (Oral)   Ht 4\' 10"  (1.473 m)   Wt 158 lb 12.8 oz (72 kg)   LMP 08/12/2016   SpO2 100%   BMI 33.19 kg/m       Objective:   Physical Exam  General Appearance- Not in acute distress.    Chest and Lung Exam Auscultation: Breath sounds:-Normal. Clear even and unlabored. Adventitious sounds:- No Adventitious sounds.  Cardiovascular Auscultation:Rythm - Regular, rate and rythm. Heart Sounds -Normal heart sounds.  Abdomen Inspection:-Inspection Normal.  Palpation/Perucssion: Palpation and Percussion of the abdomen reveal- Non Tender, No Rebound tenderness, No rigidity(Guarding) and No Palpable abdominal masses.  Liver:-Normal.  Spleen:- Normal.   Back No Mid lumbar spine tenderness to palpation. Milld Pain on straight leg lift.(also some rt upper hamstring region pain) No pain on lateral movements and flexion/extension of the spine.  Lower ext neurologic  L5-S1 sensation intact bilaterally. Normal patellar reflexes bilaterally. No foot drop bilaterally.  Neuro- CN III- XII grossly intact.      Assessment & Plan:  For your back pain continue the lyrica and I am prescribing 800 mg motrin. I do want to check your kidney function today since you will use nsaid.  I am happy to hear your pain is a lot better. I am going to write limited number of percocet in event you get flare. If no flare over next month will dc med.  Note to return to work but only 20 hours per week for next 2 weeks. Will see if you can tolerate this. Then decide if you can work full time. If you do part time but have issues with pain please let us know before 2 weeks.   For migraine ha would recommend using imitrex first. Appears staff sent in fiocet. I would use this only as back up if imitrex does not help. But  keep in mind severe ha or any HA with deficits would need ED evaluation.  Follow up in 2 months or as needed  Also asked pt to contact specialist who did steroid injection to see when would be next one going forward since she had such a good response.  Artie Takayama, [name], PA-C

## 2016-09-09 NOTE — Progress Notes (Deleted)
Subjective:   Phyllis Adams is a 41 y.o. female who presents for an Initial Medicare Annual Wellness Visit.  Review of Systems    No ROS.  Medicare Wellness Visit.   Sleep patterns:  Home Safety/Smoke Alarms:   Living environment; residence and Firearm Safety: . Seat Belt Safety/Bike Helmet: Wears seat belt.   Counseling:   Eye Exam-  Dental-  Female:   Pap-       Mammo-       Dexa scan-        CCS-     Objective:    There were no vitals filed for this visit. There is no height or weight on file to calculate BMI.   Current Medications (verified) Outpatient Encounter Prescriptions as of 09/10/2016  Medication Sig  . Butalbital-APAP-Caffeine (FIORICET) 50-300-40 MG CAPS 1 tab po q 8 hrs as needed HA  . cyclobenzaprine (FLEXERIL) 10 MG tablet Take 1 tablet (10 mg total) by mouth 3 (three) times daily as needed for muscle spasms.  . diclofenac (VOLTAREN) 75 MG EC tablet TAKE 1 TABLET BY MOUTH TWICE DAILY  . fluticasone (FLONASE) 50 MCG/ACT nasal spray Place 2 sprays into both nostrils daily.  [name]. ibuprofen (ADVIL,MOTRIN) 800 MG tablet Take 1 tablet (800 mg total) by mouth every 8 (eight) hours as needed.  . ondansetron (ZOFRAN-ODT) 8 MG disintegrating tablet DISSOLVE 1 TABLET(8 MG) ON THE TONGUE EVERY 8 HOURS AS NEEDED FOR NAUSEA OR VOMITING  . ondansetron (ZOFRAN-ODT) 8 MG disintegrating tablet DISSOLVE 1 TABLET(8 MG) ON THE TONGUE EVERY 8 HOURS AS NEEDED FOR NAUSEA OR VOMITING  . ondansetron (ZOFRAN-ODT) 8 MG disintegrating tablet DISSOLVE 1 TABLET(8 MG) ON THE TONGUE EVERY 8 HOURS AS NEEDED FOR NAUSEA OR VOMITING  . oxyCODONE-acetaminophen (ROXICET) 5-325 MG tablet Take 1 tablet by mouth every 8 (eight) hours as needed for severe pain.  . pregabalin (LYRICA) 200 MG capsule Take 1 capsule (200 mg total) by mouth 2 (two) times daily.  . SUMAtriptan (IMITREX) 50 MG tablet .1 tab po at onset of ha. Repeat in 2 hours if ha persists(max number of tabs is 2 in any 24 hour period)    No facility-administered encounter medications on file as of 09/10/2016.     Allergies (verified) Tramadol and Hydrocodone   History: Past Medical History:  Diagnosis Date  . Depression   . Femur fracture (HCC) 2006  . Hypertension   . Migraine   . Mood disorder [facility](HCC)    Past Surgical History:  Procedure Laterality Date  . APPENDECTOMY    . CHOLECYSTECTOMY    . FEMUR FRACTURE SURGERY Right 2006  . FEMUR IM ROD REMOVAL Right 03/11/2016  . TUBAL LIGATION     No family history on file. Social History   Occupational History  . Not on file.   Social History Main Topics  . Smoking status: Current Every Day Smoker    Types: Cigars  . Smokeless tobacco: Never Used  . Alcohol use No  . Drug use:     Types: Marijuana  . Sexual activity: Yes    Birth control/ protection: Other-see comments, Surgical    Tobacco Counseling Ready to quit: Not Answered Counseling given: Not Answered   Activities of Daily Living No flowsheet data found.  Immunizations and Health Maintenance  There is no immunization history on file for this patient. Health Maintenance Due  Topic Date Due  . HIV Screening  05/08/1990  . MAMMOGRAM  05/08/1993  . TETANUS/TDAP  05/08/1994  .  PAP SMEAR  05/08/1996    Patient Care Team: Esperanza Richters, PA-C as PCP - General (Internal Medicine)  Indicate any recent Medical Services you may have received from other than [facility] providers in the past year (date may be approximate).     Assessment:   This is a routine wellness examination for [name]. Physical assessment deferred to PCP.   Hearing/Vision screen No exam data present  Dietary issues and exercise activities discussed:   Diet (meal preparation, eat out, water intake, caffeinated beverages, dairy products, fruits and vegetables): {Desc; diets:16563} Breakfast: Lunch:  Dinner:      Goals    None     Depression Screen No flowsheet data found.  Fall Risk No flowsheet data  found.  Cognitive Function:        Screening Tests Health Maintenance  Topic Date Due  . HIV Screening  05/08/1990  . MAMMOGRAM  05/08/1993  . TETANUS/TDAP  05/08/1994  . PAP SMEAR  05/08/1996  . INFLUENZA VACCINE  12/05/2016 (Originally 04/07/2016)      Plan:   ***  During the course of the visit, Alyxis was educated and counseled about the following appropriate screening and preventive services:   Vaccines to include Pneumoccal, Influenza, Hepatitis B, Td, Zostavax, HCV  Electrocardiogram  Cardiovascular disease screening  Colorectal cancer screening  Bone density screening  Diabetes screening  Glaucoma screening  Mammography/PAP  Nutrition counseling  Smoking cessation counseling  Patient Instructions (the written plan) were given to the patient.    Avon Gully, [place]   09/09/2016

## 2016-09-09 NOTE — Progress Notes (Deleted)
Pre visit review using our clinic review tool, if applicable. No additional management support is needed unless otherwise documented below in the visit note. 

## 2016-09-10 ENCOUNTER — Ambulatory Visit: Payer: Medicare Other | Admitting: *Deleted

## 2016-09-16 ENCOUNTER — Other Ambulatory Visit: Payer: Self-pay | Admitting: Medical

## 2016-09-21 NOTE — Telephone Encounter (Signed)
Last butalbital rx: ?? 08/25/16, #30 Last OV: 09/04/16 UDS: contract signed in 07/2016 but no UDS on file.    Please advise request?

## 2016-09-22 NOTE — Telephone Encounter (Signed)
Will you have patient schedule appointment. Plan to get drug screen on follow up.thanks for reviewing this. I don't want to fill butalbital presently. Looks like using too frequently. If ha this frequent she need visit,neuro exam and may get imaging. Please have her schedule appointment.

## 2016-09-25 NOTE — Telephone Encounter (Signed)
Notified pt of below. She states she just called to get Rx from 08/25/16 filled and was told by pharmacy that she would have to get a new Rx since that one is now expired. Pt states she doesn't have current transportation and will not be able to schedule an appt for 2 months. [facility] requiring pt to get new Rx of butalbital. Last one filled 11/2015. Pt did get refill of her ibuprofen on 09/20/16. Pt states she will call back to schedule an appt when she has transportation.

## 2016-09-28 ENCOUNTER — Telehealth: Payer: Self-pay | Admitting: Medical

## 2016-09-28 MED ORDER — BUTALBITAL-APAP-CAFFEINE 50-300-40 MG PO CAPS: ORAL_CAPSULE | ORAL | 0 refills | Status: DC

## 2016-09-28 NOTE — Telephone Encounter (Signed)
Yes, you did. She is aware and will call when she has transportation. Thinks she will have transportation next month.

## 2016-09-28 NOTE — Telephone Encounter (Signed)
Did I send you a note on this pt? Sent in butalbital. But needs office visit before any more refills. Notify pt please.

## 2016-09-28 NOTE — Telephone Encounter (Signed)
See 1.22.18 phone note

## 2016-09-28 NOTE — Telephone Encounter (Signed)
refilling her butalbital but needs to find ride up here before any further refills of controlled meds.

## 2016-09-28 NOTE — Telephone Encounter (Signed)
Notified pt and she voices understanding. 

## 2016-09-29 ENCOUNTER — Encounter: Payer: Self-pay | Admitting: Family Medicine

## 2016-09-29 ENCOUNTER — Other Ambulatory Visit: Payer: Self-pay | Admitting: *Deleted

## 2016-09-29 MED ORDER — BUTALBITAL-APAP-CAFFEINE 50-300-40 MG PO CAPS: ORAL_CAPSULE | ORAL | 0 refills | Status: DC

## 2016-09-29 NOTE — Progress Notes (Signed)
Change to tablets per pharmacy fax request d/t Insurance coverage/SLS 01/23

## 2016-09-30 ENCOUNTER — Other Ambulatory Visit: Payer: Self-pay | Admitting: Family Medicine

## 2016-09-30 DIAGNOSIS — G8929 Other chronic pain: Secondary | ICD-10-CM

## 2016-09-30 DIAGNOSIS — M545 Low back pain, unspecified: Secondary | ICD-10-CM

## 2016-10-07 ENCOUNTER — Ambulatory Visit
Admission: RE | Admit: 2016-10-07 | Discharge: 2016-10-07 | Disposition: A | Discharge status: Home / Self Care | Payer: Medicare Other | Source: Ambulatory Visit | Attending: Family Medicine | Admitting: Family Medicine

## 2016-10-07 DIAGNOSIS — G8929 Other chronic pain: Secondary | ICD-10-CM

## 2016-10-07 DIAGNOSIS — M47817 Spondylosis without myelopathy or radiculopathy, lumbosacral region: Secondary | ICD-10-CM | POA: Diagnosis not present

## 2016-10-07 DIAGNOSIS — M545 Low back pain: Principal | ICD-10-CM

## 2016-10-07 MED ORDER — METHYLPREDNISOLONE ACETATE 40 MG/ML INJ SUSP (RADIOLOG
120.0000 mg | Freq: Once | INTRAMUSCULAR | Status: AC
  Administered 2016-10-07: 120 mg via EPIDURAL

## 2016-10-07 MED ORDER — IOPAMIDOL (ISOVUE-M 200) INJECTION 41%
1.0000 mL | Freq: Once | INTRAMUSCULAR | Status: AC
  Administered 2016-10-07: 1 mL via EPIDURAL

## 2016-10-07 NOTE — Discharge Instructions (Signed)

## 2016-10-17 ENCOUNTER — Other Ambulatory Visit: Payer: Self-pay | Admitting: Medical

## 2016-10-20 ENCOUNTER — Other Ambulatory Visit: Payer: Self-pay | Admitting: *Deleted

## 2016-10-20 MED ORDER — SUMATRIPTAN SUCCINATE 50 MG PO TABS: ORAL_TABLET | ORAL | 0 refills | Status: DC

## 2016-10-20 NOTE — Progress Notes (Signed)
Pt request for Fioricet Denied, last Rx 09/29/16 #30x0, per provider documentation, patient is only to be using for breakthrough pain post taking Imitrex; Imitrex sent to pharmacy with note: **PATIENT NEEDS APPOINTMENT IF NO RELIEF OF HEADACHES **/SLS 02/13

## 2016-10-20 NOTE — Telephone Encounter (Signed)
Last seen 12/ 29 /17  Last filled 1/23 /18  #[id number]   Please advise  pc

## 2016-10-20 NOTE — Telephone Encounter (Signed)
I had wanted pt to have office visit before any further refills fiorecet. See my note section correspondence with Nicki Guadalajararicia. So advise pt to this effect. I don't her overusing butalbital. Might refer her to neurologist.

## 2016-10-21 NOTE — Telephone Encounter (Signed)
Please call patient and have her schedule F/U visit prior to future refill authorizations per provider/SLS 02/14

## 2016-10-22 NOTE — Telephone Encounter (Signed)
Patient scheduled for 10/27/2016

## 2016-10-27 ENCOUNTER — Telehealth: Payer: Self-pay | Admitting: Medical

## 2016-10-27 ENCOUNTER — Ambulatory Visit: Payer: Medicare Other | Admitting: Medical

## 2016-10-27 NOTE — Telephone Encounter (Signed)
Charge. If she appeals let me know but I think she has done this before. Was it for same reason?

## 2016-10-27 NOTE — Telephone Encounter (Signed)
Patient lvm cancelling 11am appointment due patient experiencing car trouble, charge or no charge

## 2016-11-02 ENCOUNTER — Encounter: Payer: Self-pay | Admitting: Medical

## 2016-11-05 ENCOUNTER — Other Ambulatory Visit: Payer: Self-pay

## 2016-11-05 ENCOUNTER — Ambulatory Visit (INDEPENDENT_AMBULATORY_CARE_PROVIDER_SITE_OTHER): Payer: Medicare Other | Admitting: Medical

## 2016-11-05 VITALS — HR 57 | Temp 98.2°F | Ht <= 58 in | Wt 159.4 lb

## 2016-11-05 DIAGNOSIS — G43809 Other migraine, not intractable, without status migrainosus: Secondary | ICD-10-CM

## 2016-11-05 DIAGNOSIS — M545 Low back pain, unspecified: Secondary | ICD-10-CM

## 2016-11-05 MED ORDER — BUTALBITAL-APAP-CAFFEINE 50-300-40 MG PO CAPS: ORAL_CAPSULE | ORAL | 0 refills | Status: DC

## 2016-11-05 MED ORDER — ONDANSETRON 8 MG PO TBDP: ORAL_TABLET | ORAL | 0 refills | Status: DC

## 2016-11-05 MED ORDER — SUMATRIPTAN SUCCINATE 50 MG PO TABS: ORAL_TABLET | ORAL | 0 refills | Status: DC

## 2016-11-05 NOTE — Telephone Encounter (Signed)
Will you call pt pharmacy and verify that they got fioricet. Also why can't they give her capsules. Anyway to get her capsules instead of tabs?

## 2016-11-05 NOTE — Progress Notes (Signed)
Pre visit review using our clinic tool,if applicable. No additional management support is needed unless otherwise documented below in the visit note.  

## 2016-11-05 NOTE — Patient Instructions (Signed)
For your low back pain continue current regimen meloxicam and epidural injections. You might want to supplament your meloxicam with tylenol to avoid overusing the meloxicam.  For migraine ha will rx your fioricet. As reminder severe ha with neurologic signs or symptoms then ED evaluation  Follow up in 1 month approximate for CPE fasting.

## 2016-11-05 NOTE — Progress Notes (Addendum)
Subjective:    Patient ID: Phyllis Adams, female    DOB: 08/04/1976, 41 y.o.   MRN: [id number]  HPI   Pt in reporting back at work at [employer]. She states get 2 days a week of work. Pt had been getting epidural injection for her back pain. After her 3rd injection her back pain had decreased a lot. Pt specialist has her on meloxicam 15 mg a day. Sometimes she has to take 2 tabs a day. Pt states specialist was trying to rx lidocaine batch but they won't pay for the patch. Pt is aware of meloxicam is designed to use just one time a day.(explained not to use twice daily). Pt is no longer on narcotic. I had written that for her in past while she was hoping epidural took full effect. Pt is no longer on lyrica.  Pt has history of ha. She states most of time has HA around her menstrual cycle for about one week. Pt states she prefers capsules instead of tabs. She states hard time swollowing tabs. Pt states around her menses et nause, light senstivity with ha.   LMP- one week ago.  Review of Systems  Constitutional: Negative for chills, fatigue and fever.  Respiratory: Negative for cough, chest tightness, shortness of breath and wheezing.   Cardiovascular: Negative for chest pain and palpitations.  Gastrointestinal: Negative for abdominal pain, diarrhea, nausea and vomiting.  Musculoskeletal: Positive for back pain.       See hpi. Much less now.  Skin: Negative for rash.  Neurological: Positive for headaches. Negative for dizziness.       Migraine like around her menses.  Hematological: Negative for adenopathy. Does not bruise/bleed easily.  Psychiatric/Behavioral: Negative for agitation, behavioral problems, dysphoric mood and hallucinations.   Past Medical History:  Diagnosis Date  . Depression   . Femur fracture (HCC) 2006  . Hypertension   . Migraine   . Mood disorder [facility](HCC)      Social History   Social History  . Marital status: Legally Separated    Spouse name: N/A  . Number of  children: N/A  . Years of education: N/A   Occupational History  . Not on file.   Social History Main Topics  . Smoking status: Current Every Day Smoker    Types: Cigars  . Smokeless tobacco: Never Used  . Alcohol use No  . Drug use: Yes    Types: Marijuana  . Sexual activity: Yes    Birth control/ protection: Other-see comments, Surgical   Other Topics Concern  . Not on file   Social History Narrative  . No narrative on file    Past Surgical History:  Procedure Laterality Date  . APPENDECTOMY    . CHOLECYSTECTOMY    . FEMUR FRACTURE SURGERY Right 2006  . FEMUR IM ROD REMOVAL Right 03/11/2016  . TUBAL LIGATION      No family history on file.  Allergies  Allergen Reactions  . Tramadol Shortness Of Breath and Nausea And Vomiting  . Hydrocodone-Acetaminophen Other (See Comments)    Sweats, heavy breathing  . Hydrocodone   . Acetaminophen-Codeine Nausea Only    Other reaction(s): NAUSEA    Current Outpatient Prescriptions on File Prior to Visit  Medication Sig Dispense Refill  . ibuprofen (ADVIL,MOTRIN) 800 MG tablet Take 1 tablet (800 mg total) by mouth every 8 (eight) hours as needed. 30 tablet 1  . ondansetron (ZOFRAN-ODT) 8 MG disintegrating tablet DISSOLVE 1 TABLET(8 MG) ON THE TONGUE EVERY 8  HOURS AS NEEDED FOR NAUSEA OR VOMITING 15 tablet 0  . SUMAtriptan (IMITREX) 50 MG tablet .1 tab po at onset of ha. Repeat in 2 hours if ha persists(max number of tabs is 2 in any 24 hour period) 10 tablet 0  . Butalbital-APAP-Caffeine (FIORICET) 50-300-40 MG CAPS 1 tab po q 8 hrs as needed HA (Patient not taking: Reported on 11/05/2016) 30 capsule 0  . cyclobenzaprine (FLEXERIL) 10 MG tablet Take 1 tablet (10 mg total) by mouth 3 (three) times daily as needed for muscle spasms. (Patient not taking: Reported on 11/05/2016) 30 tablet 0  . diclofenac (VOLTAREN) 75 MG EC tablet TAKE 1 TABLET BY MOUTH TWICE DAILY (Patient not taking: Reported on 11/05/2016) 30 tablet 0  . fluticasone  (FLONASE) 50 MCG/ACT nasal spray Place 2 sprays into both nostrils daily. (Patient not taking: Reported on 11/05/2016) 16 g 1  . oxyCODONE-acetaminophen (ROXICET) 5-325 MG tablet Take 1 tablet by mouth every 8 (eight) hours as needed for severe pain. (Patient not taking: Reported on 11/05/2016) 12 tablet 0  . pregabalin (LYRICA) 200 MG capsule Take 1 capsule (200 mg total) by mouth 2 (two) times daily. (Patient not taking: Reported on 11/05/2016) 60 capsule 1   No current facility-administered medications on file prior to visit.     Pulse (!) 57   Temp 98.2 F (36.8 C) (Oral)   Ht 4\' 10"  (1.473 m)   Wt 159 lb 6.4 oz (72.3 kg)   LMP 10/07/2016 (Exact Date)   SpO2 100%   BMI 33.31 kg/m       Objective:   Physical Exam  General Mental Status- Alert. General Appearance- Not in acute distress.   Skin General: Color- Normal Color. Moisture- Normal Moisture.  Neck Carotid Arteries- Normal color. Moisture- Normal Moisture. No carotid bruits. No JVD.  Chest and Lung Exam Auscultation: Breath Sounds:-Normal.  Cardiovascular Auscultation:Rythm- Regular. Murmurs & Other Heart Sounds:Auscultation of the heart reveals- No Murmurs.  Abdomen Inspection:-Inspeection Normal. Palpation/Percussion:Note:No mass. Palpation and Percussion of the abdomen reveal- Non Tender, Non Distended + BS, no rebound or guarding.   Neurologic Cranial Nerve exam:- CN III-XII intact(No nystagmus), symmetric smile. Finger to Nose:- Normal/Intact Strength:- 5/5 equal and symmetric strength both upper and lower extremities.  . Back No Mid lumbar spine tenderness to palpation. No pain on straight leg lift. No pain on lateral movements and flexion/extension of the spine.  Lower ext neurologic  L5-S1 sensation intact bilaterally. Normal patellar reflexes bilaterally. No foot drop bilaterally.      Assessment & Plan:  For your low back pain continue current regimen meloxicam and epidural injections. You  might want to supplament your meloxicam with tylenol to avoid overusing the meloxicam.  For migraine ha will rx your fioricet. As reminder severe ha with neurologic signs or symptoms then ED evaluation  Follow up in 1 month approximate for CPE fasting.

## 2016-11-07 ENCOUNTER — Other Ambulatory Visit: Payer: Self-pay | Admitting: Medical

## 2016-11-09 ENCOUNTER — Other Ambulatory Visit: Payer: Self-pay | Admitting: Medical

## 2016-12-19 DIAGNOSIS — R200 Anesthesia of skin: Secondary | ICD-10-CM | POA: Diagnosis not present

## 2016-12-19 DIAGNOSIS — I10 Essential (primary) hypertension: Secondary | ICD-10-CM | POA: Diagnosis not present

## 2016-12-19 DIAGNOSIS — Z885 Allergy status to narcotic agent status: Secondary | ICD-10-CM | POA: Diagnosis not present

## 2016-12-19 DIAGNOSIS — F17200 Nicotine dependence, unspecified, uncomplicated: Secondary | ICD-10-CM | POA: Diagnosis not present

## 2016-12-19 DIAGNOSIS — R202 Paresthesia of skin: Secondary | ICD-10-CM | POA: Diagnosis not present

## 2016-12-19 DIAGNOSIS — J45909 Unspecified asthma, uncomplicated: Secondary | ICD-10-CM | POA: Diagnosis not present

## 2016-12-19 DIAGNOSIS — R0602 Shortness of breath: Secondary | ICD-10-CM | POA: Diagnosis not present

## 2016-12-19 DIAGNOSIS — Z7951 Long term (current) use of inhaled steroids: Secondary | ICD-10-CM | POA: Diagnosis not present

## 2016-12-29 IMAGING — XA Imaging study
1 series · 1 of 1 positions shown · non-contrast
Comparison: none

CLINICAL DATA: Lumbosacral spondylosis without myelopathy

[Series 1: ortho standard · 1 of 1 slices shown]
[im 1/1]
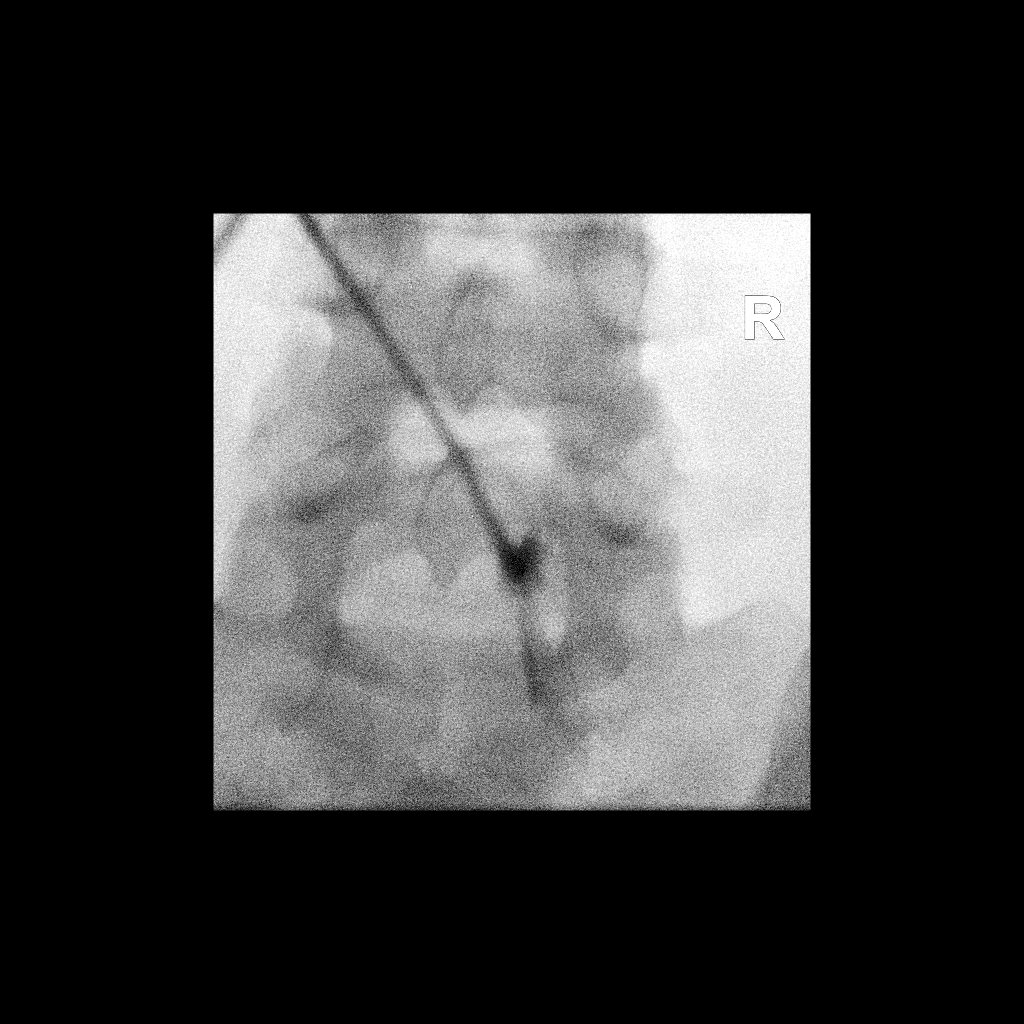

[1 of 1 positions shown; findings below may reference images not displayed]

FLUOROSCOPY TIME:  17 second

PROCEDURE:
The procedure, risks, benefits, and alternatives were explained to
the patient. Questions regarding the procedure were encouraged and
answered. The patient understands and consents to the procedure.

LUMBAR EPIDURAL INJECTION:

An interlaminar approach was performed on right at L5-S1. The
overlying skin was cleansed and anesthetized. A 20 gauge epidural
needle was advanced using loss-of-resistance technique.

DIAGNOSTIC EPIDURAL INJECTION:

Injection of Isovue-M 200 shows a good epidural pattern with spread
above and below the level of needle placement, primarily on the
right no vascular opacification is seen.

THERAPEUTIC EPIDURAL INJECTION:

One hundred twenty Mg of Depo-Medrol mixed with 4 cc 1% lidocaine
were instilled. The procedure was well-tolerated, and the patient
was discharged thirty minutes following the injection in good
condition.

COMPLICATIONS:
None
IMPRESSION: Technically successful epidural injection on the right L5-S1.

## 2017-01-18 ENCOUNTER — Telehealth: Payer: Self-pay | Admitting: Medical

## 2017-01-18 ENCOUNTER — Ambulatory Visit: Payer: Medicare Other | Admitting: Medical

## 2017-01-18 DIAGNOSIS — R0600 Dyspnea, unspecified: Secondary | ICD-10-CM

## 2017-01-18 DIAGNOSIS — Z0189 Encounter for other specified special examinations: Secondary | ICD-10-CM

## 2017-01-18 DIAGNOSIS — Z789 Other specified health status: Secondary | ICD-10-CM

## 2017-01-18 DIAGNOSIS — R7989 Other specified abnormal findings of blood chemistry: Secondary | ICD-10-CM

## 2017-01-18 NOTE — Telephone Encounter (Signed)
No charge. 

## 2017-01-18 NOTE — Telephone Encounter (Signed)
Caller name:Ytzel Harris Relationship to patient: Can be reached: Pharmacy:  Reason for call:Unable to get off work in time for appointment

## 2017-02-09 ENCOUNTER — Encounter: Payer: Self-pay | Admitting: Medical

## 2017-02-09 ENCOUNTER — Ambulatory Visit (INDEPENDENT_AMBULATORY_CARE_PROVIDER_SITE_OTHER): Payer: Medicare Other | Admitting: Medical

## 2017-02-09 ENCOUNTER — Ambulatory Visit (HOSPITAL_BASED_OUTPATIENT_CLINIC_OR_DEPARTMENT_OTHER)
Admission: RE | Admit: 2017-02-09 | Discharge: 2017-02-09 | Disposition: A | Discharge status: Home / Self Care | Payer: Medicare Other | Source: Ambulatory Visit | Attending: Medical | Admitting: Medical

## 2017-02-09 ENCOUNTER — Telehealth: Payer: Self-pay | Admitting: Medical

## 2017-02-09 VITALS — BP 141/96 | HR 74 | Temp 98.4°F | Resp 16 | Ht <= 58 in | Wt 149.6 lb

## 2017-02-09 DIAGNOSIS — F419 Anxiety disorder, unspecified: Secondary | ICD-10-CM | POA: Diagnosis not present

## 2017-02-09 DIAGNOSIS — M79602 Pain in left arm: Secondary | ICD-10-CM

## 2017-02-09 DIAGNOSIS — R0600 Dyspnea, unspecified: Secondary | ICD-10-CM

## 2017-02-09 DIAGNOSIS — M79604 Pain in right leg: Secondary | ICD-10-CM | POA: Insufficient documentation

## 2017-02-09 DIAGNOSIS — R600 Localized edema: Secondary | ICD-10-CM | POA: Diagnosis not present

## 2017-02-09 DIAGNOSIS — R7989 Other specified abnormal findings of blood chemistry: Secondary | ICD-10-CM | POA: Diagnosis not present

## 2017-02-09 DIAGNOSIS — M545 Low back pain, unspecified: Secondary | ICD-10-CM

## 2017-02-09 DIAGNOSIS — R0602 Shortness of breath: Secondary | ICD-10-CM | POA: Diagnosis not present

## 2017-02-09 LAB — TROPONIN I: TNIDX: 0.01 ug/l (ref 0.00–0.06)

## 2017-02-09 LAB — D-DIMER, QUANTITATIVE: D-Dimer, Quant: 0.57 mcg/mL FEU — ABNORMAL HIGH (ref <0.50)

## 2017-02-09 MED ORDER — NABUMETONE 750 MG PO TABS: 750.0000 mg | ORAL_TABLET | Freq: Two times a day (BID) | ORAL | 0 refills | Status: DC

## 2017-02-09 NOTE — Progress Notes (Signed)
Subjective:    Patient ID: Phyllis Adams, female    DOB: [birth year], 41 y.o.   MRN: [id number]  HPI  Pt in for follow up.   Pt states her prior back pain is controlled. Pt was on meloxicam for pain. Pt is no longer on pain medications. Pt is able to work. She has been working a lot. Pt has been going to pain specialist and has gotten epidural. Pt thinks she is due for epidural. Dr. Pearletha Forge had referred her to someone. Pt read about meloxicam side effects and she thinks symptoms may be side effect   She also updates me about month or more had some shortness of breath and some faint arm pain. She went to [facility]. Pt had labs, ekg and ct of chest. Those studies negative. With dyspnea  left arm pain.(paresthesias as well) ay of ED eval.. Pt mri head in ED was negative. I reviewed ED records but did not see ekg. Though saw report.  Pt states randomly will get sob episodes that occur randomly less than 10 minutes and then resolve. Pt had some pain behind her rt leg. This is leg of prior surgery.   Pt has not chest symptoms currently.   She also has history of panic attacks. She questions if her sob and left arm were anxiety related. But she is not sure. In past never had arm symptoms.(paresthesias)   lmp- now.  Review of Systems  Constitutional: Negative for chills, diaphoresis, fatigue and fever.  HENT: Negative for ear pain and facial swelling.   Respiratory: Negative for cough, chest tightness, wheezing and stridor.        See hpi. Rare episodes.  Cardiovascular: Negative for chest pain and palpitations.  Gastrointestinal: Negative for abdominal pain, diarrhea, nausea and vomiting.  Musculoskeletal: Positive for back pain. Negative for arthralgias, myalgias, neck pain and neck stiffness.       Lt arm pain /paresthesias.  Rt leg pain.  Neurological: Negative for dizziness, seizures, syncope, speech difficulty, weakness, light-headedness and headaches.  Hematological:  Negative for adenopathy. Does not bruise/bleed easily.  Psychiatric/Behavioral: Negative for behavioral problems and confusion.   Past Medical History:  Diagnosis Date  . Depression   . Femur fracture (HCC) 2006  . Hypertension   . Migraine   . Mood disorder [facility])      Social History   Social History  . Marital status: Legally Separated    Spouse name: N/A  . Number of children: N/A  . Years of education: N/A   Occupational History  . Not on file.   Social History Main Topics  . Smoking status: Current Every Day Smoker    Types: Cigars  . Smokeless tobacco: Never Used  . Alcohol use No  . Drug use: Yes    Types: Marijuana  . Sexual activity: Yes    Birth control/ protection: Other-see comments, Surgical   Other Topics Concern  . Not on file   Social History Narrative  . No narrative on file    Past Surgical History:  Procedure Laterality Date  . APPENDECTOMY    . CHOLECYSTECTOMY    . FEMUR FRACTURE SURGERY Right 2006  . FEMUR IM ROD REMOVAL Right 03/11/2016  . TUBAL LIGATION      No family history on file.  Allergies  Allergen Reactions  . Tramadol Shortness Of Breath and Nausea And Vomiting  . Hydrocodone-Acetaminophen Other (See Comments)    Sweats, heavy breathing  . Hydrocodone   . Acetaminophen-Codeine  Nausea Only    Other reaction(s): NAUSEA    Current Outpatient Prescriptions on File Prior to Visit  Medication Sig Dispense Refill  . Butalbital-APAP-Caffeine (FIORICET) 50-300-40 MG CAPS 1 tab po q 8 hrs as needed HA 30 capsule 0  . cyclobenzaprine (FLEXERIL) 10 MG tablet Take 1 tablet (10 mg total) by mouth 3 (three) times daily as needed for muscle spasms. 30 tablet 0  . fluticasone (FLONASE) 50 MCG/ACT nasal spray Place 2 sprays into both nostrils daily. 16 g 1  . meloxicam (MOBIC) 15 MG tablet Take 15 mg by mouth daily.    . ondansetron (ZOFRAN-ODT) 8 MG disintegrating tablet DISSOLVE 1 TABLET(8 MG) ON THE TONGUE EVERY 8 HOURS AS NEEDED FOR  NAUSEA OR VOMITING 15 tablet 0  . ondansetron (ZOFRAN-ODT) 8 MG disintegrating tablet DISSOLVE 1 TABLET(8 MG) ON THE TONGUE EVERY 8 HOURS AS NEEDED FOR NAUSEA OR VOMITING 15 tablet 0  . SUMAtriptan (IMITREX) 50 MG tablet .1 tab po at onset of ha. Repeat in 2 hours if ha persists(max number of tabs is 2 in any 24 hour period) 10 tablet 0   No current facility-administered medications on file prior to visit.     BP (!) 141/96 (BP Location: Left Arm, Patient Position: Sitting, Cuff Size: Large)   Pulse 74   Temp 98.4 F (36.9 C) (Oral)   Resp 16   Ht $Rem [id number]$4\' 10"%   BMI 31.27 kg/m       Objective:   Physical Exam   General Mental Status- Alert. General Appearance- Not in acute distress.   Skin General: Color- Normal Color. Moisture- Normal Moisture.  Neck Carotid Arteries- Normal color. Moisture- Normal Moisture. No carotid bruits. No JVD.  Chest and Lung Exam Auscultation: Breath Sounds:-Normal.  Cardiovascular Auscultation:Rythm- Regular. Murmurs & Other Heart Sounds:Auscultation of the heart reveals- No Murmurs.  Abdomen Inspection:-Inspeection Normal. Palpation/Percussion:Note:No mass. Palpation and Percussion of the abdomen reveal- Non Tender, Non Distended + BS, no rebound or guarding.    Neurologic Cranial Nerve exam:- CN III-XII intact(No nystagmus), symmetric smile. Strength:- 5/5 equal and symmetric strength both upper and lower extremities.   Back No Mid lumbar spine tenderness to palpation.   Lower ext neurologic  L5-S1 sensation intact bilaterally. Normal patellar reflexes bilaterally. No foot drop bilaterally.  Rt lower ext- faint + homan sign.  Lt lower ext- neg homan sign.  Calves both symmetric.     Assessment & Plan:  For back pain would recommend stopping meloxicam and starting Relafen. This may help while we determine if you had rare side effect meloxicam. Will ask staff to call [facility] and get you scheduled again for epidural.  For atypical random dyspnea and arm pain we got ekg. Ekg showed normal sinus rhythm.Will get d-dimer today, troponin, cxr and rt lower ext doppler. If d-dimer positive will need to repeat ct chest and may need left lower ext US.  Will refer to cardiologist to evaluate episodes of dyspnea with arm/hand paresthesia. If any recurrent constant type symptoms then ED eval.  Follow up in 3-4 weeks or as needed. I will out of office next 2-3 weeks so if you need to be seen other provider may have space on their schedule. But reminder if sever symptoms then ED evaluation.  Total time spent with pt 40 minutes. 50% of time spent explaining differential dx and the corresponding work up for sob and arm pain  episodes. In additon discussed her chronic back pain.

## 2017-02-09 NOTE — Telephone Encounter (Signed)
Pt will try our pharmacy tomorrow to see if cheaper price for relafen.. Notified negative studies.   Did explain in light of slight d-dimer elevation if she were to get left lower ext pain we would need to get US of that ext as well. Pt expressed understanding.

## 2017-02-09 NOTE — Telephone Encounter (Signed)
Stat ct chest to evaluate sob and elevated d-dimer today.

## 2017-02-09 NOTE — Patient Instructions (Addendum)
For back pain would recommend stopping meloxicam and starting Relafen. This may help while we determine if you had rare side effect meloxicam. Will ask staff to call [facility] and get you scheduled again for epidural.  For atypical random dyspnea and arm pain we got ekg. Ekg showed normal sinus rhythm.Will get d-dimer today, troponin, cxr and rt lower ext doppler(to be done 1:30). If d-dimer positive will need to repeat ct chest and may need left lower ext US.  Will refer to cardiologist to evaluate episodes of dyspnea with arm/hand paresthesia. If any recurrent constant type symptoms then ED eval.  Follow up in 3-4 weeks or as needed. I will out of office next 2-3 weeks so if you need to be seen other provider may have space on their schedule. But reminder if sever symptoms then ED evaluation.

## 2017-02-10 ENCOUNTER — Telehealth: Payer: Self-pay

## 2017-02-10 LAB — COMPREHENSIVE METABOLIC PANEL
ALBUMIN: 4.4 g/dL (ref 3.5–5.2)
ALK PHOS: 59 U/L (ref 39–117)
ALT: 8 U/L (ref 0–35)
AST: 14 U/L (ref 0–37)
BILIRUBIN TOTAL: 0.2 mg/dL (ref 0.2–1.2)
BUN: 8 mg/dL (ref 6–23)
CALCIUM: 9.8 mg/dL (ref 8.4–10.5)
CHLORIDE: 107 meq/L (ref 96–112)
CO2: 26 mEq/L (ref 19–32)
CREATININE: 0.69 mg/dL (ref 0.40–1.20)
GFR: 120.13 mL/min (ref >60.00)
Glucose, Bld: 77 mg/dL (ref 70–99)
Potassium: 3.9 mEq/L (ref 3.5–5.1)
Sodium: 139 mEq/L (ref 135–145)
TOTAL PROTEIN: 7.6 g/dL (ref 6.0–8.3)

## 2017-02-10 NOTE — Telephone Encounter (Signed)
Medication Request Change: Reason for change Insurance will not cover without PA. Preferred medication: ibuprofen Naproxen or Meloxicam. Per pt can not take Meloxicam due to side effects.

## 2017-02-10 NOTE — Telephone Encounter (Signed)
Okay, could we do a PA for Relafen? She is apparently intolerant of meloxicam. If we can't get relafen, then will do Tylenol  500 mg OTC 2 tabs a day every 8 hours as needed and continue with Flexeril. She is allergic to a number of pain medicines.

## 2017-02-11 NOTE — Telephone Encounter (Signed)
PA called in awaiting approval.

## 2017-02-12 MED ORDER — CYCLOBENZAPRINE HCL 10 MG PO TABS: 10.0000 mg | ORAL_TABLET | Freq: Three times a day (TID) | ORAL | 0 refills | Status: DC | PRN

## 2017-02-12 NOTE — Telephone Encounter (Signed)
I would recommend Tylenol  500 mg OTC 2 tabs a day every 8 hours as needed and continue with Flexeril prn.

## 2017-02-12 NOTE — Telephone Encounter (Signed)
Notified pt. Sent flexeril to pharmacy.

## 2017-02-12 NOTE — Telephone Encounter (Signed)
Received denial letter for Nabumetone. Plan will not cover Nabumetone until pt has tried and failed covered medication on  formulary drugs list:Meloxicam,Ibuprofen, Naproxen and Sulindac.

## 2017-02-25 NOTE — Progress Notes (Signed)
.   Cardiology Office Note NEW PATIENT VISIT  Date:  02/26/2017   ID:  Phyllis Adams, DOB 04/14/1976, MRN [id number]  PCP:  Esperanza RichtersSaguier, Edward, PA-C  Cardiologist:  Dr. Okey DupreEnd    Chief Complaint  Patient presents with  . Shortness of Breath    [facility] with and without activity      History of Present Illness: Phyllis DeutscherRasheda Adams is a 41 y.o. female who is being seen today for the evaluation of dyspnea at the request of [name], Ramon Dredgedward, [place] JerseyPA-C.   She has had recent hx of dyspnea since April per ER note and lt arm pain. [facility] SOB with walking short distance.  + tobacco use, and was on Mobic.  EKG in ER with SR prolonged QT plssible LA enlargement. No significant change MRI of brain, no acute infarct, or process.   CTA of chest without PE, rt LL nodules stable.   Troponin <0.01 ,no DVT of Rt. lower ext.    She has hx of asthma, H/a, migraines, HTN chronic back pain.    No flowsheet data found.   Today she tells me she wakes up and is short of breath getting out of bed, some fast HR and then Lt arm numbness and weakness.   Lasts awhile and goes away.  Then she is back to normal.  She thought it could be anxiety attacks but she has never had the Lt arm numbness and weakness of Lt hand.  She first noticed with work walking from car to house she would be SOB and have to sit down.  No chest pain, no diabetes. Aware of rapid HR with episodes of SOB and lt arm numbness. .       Past Medical History:  Diagnosis Date  . Depression   . Femur fracture (HCC) 2006  . Hypertension   . Migraine   . Mood disorder [facility](HCC)     Past Surgical History:  Procedure Laterality Date  . APPENDECTOMY    . CHOLECYSTECTOMY    . FEMUR FRACTURE SURGERY Right 2006  . FEMUR IM ROD REMOVAL Right 03/11/2016  . TUBAL LIGATION       Current Outpatient Prescriptions  Medication Sig Dispense Refill  . Butalbital-APAP-Caffeine (FIORICET) 50-300-40 MG CAPS 1 tab po q 8 hrs as needed HA 30  capsule 0  . cyclobenzaprine (FLEXERIL) 10 MG tablet Take 1 tablet (10 mg total) by mouth 3 (three) times daily as needed for muscle spasms. 30 tablet 0  . fluticasone (FLONASE) 50 MCG/ACT nasal spray Place 2 sprays into both nostrils daily. 16 g 1  . nabumetone (RELAFEN) 750 MG tablet Take 1 tablet (750 mg total) by mouth 2 (two) times daily. 60 tablet 0  . ondansetron (ZOFRAN-ODT) 8 MG disintegrating tablet DISSOLVE 1 TABLET(8 MG) ON THE TONGUE EVERY 8 HOURS AS NEEDED FOR NAUSEA OR VOMITING 15 tablet 0  . SUMAtriptan (IMITREX) 50 MG tablet .1 tab po at onset of ha. Repeat in 2 hours if ha persists(max number of tabs is 2 in any 24 hour period) 10 tablet 0   No current facility-administered medications for this visit.     Allergies:   Tramadol; Hydrocodone-acetaminophen; Hydrocodone; and Acetaminophen-codeine    Social History:  The patient  reports that she has been smoking Cigars.  She has been smoking about 0.00 packs per day. She has never used smokeless tobacco. She reports that she uses drugs, including Marijuana. She reports that she does  not drink alcohol.   Family History:  The patient's family history includes Hypertension in her maternal aunt, maternal grandfather, maternal grandmother, mother, and paternal uncle.    ROS:  General:no colds or fevers, + weight loss from 206 to 148 lbs, from stressed and not hungry to now watching her wt. Does not exercise.  Skin:no rashes or ulcers HEENT:no blurred vision, no congestion CV:see HPI PUL:see HPI GI:no diarrhea constipation or melena, no indigestion GU:no hematuria, no dysuria MS:no joint pain, no claudication, + sciatica type pain in Rt leg after rods removed and some lower ext edema at times in that ankle.   Neuro:no syncope, mild lightheadedness when first gets up Endo:no diabetes, no thyroid disease  Wt Readings from Last 3 Encounters:  02/26/17 148 lb (67.1 kg)  02/09/17 149 lb 9.6 oz (67.9 kg)  11/05/16 159 lb 6.4 oz  (72.3 kg)     PHYSICAL EXAM: VS:  BP 126/86   Pulse 66   Ht 4\' 10"  (1.473 m)   Wt 148 lb (67.1 kg)   LMP 02/08/2017   SpO2 99%   BMI 30.93 kg/m  , BMI Body mass index is 30.93 kg/m. General:Pleasant affect, NAD Skin:Warm and dry, brisk capillary refill HEENT:normocephalic, sclera clear, mucus membranes moist Neck:supple, no JVD, no bruits, no lymphadenopathy   Heart:S1S2 RRR without murmur, gallup, rub or click Lungs:clear without rales, rhonchi, or wheezes GNF:AOZH, non tender, + BS, do not palpate liver spleen or masses Ext:no lower ext edema, 2+ pedal pulses, 2+ radial pulses Neuro:alert and oriented X 3, MAE, follows commands, + facial symmetry  With walking in office SP02 remained at 96% and HR 99, she was SOB.  EKG:  EKG is NOT ordered today. Reviewed EKG from 02/09/17 with normal EKG.   Recent Labs: 02/09/2017: ALT 8; BUN 8; Creatinine, Ser 0.69; Potassium 3.9; Sodium 139 02/26/2017: TSH 0.425    Lipid Panel No results found for: CHOL, TRIG, HDL, CHOLHDL, VLDL, LDLCALC, LDLDIRECT     Other studies Reviewed: Additional studies/ records that were reviewed today include: venous dopplers see above .  CTA of chest 12/2016 CT ANGIOGRAPHY CHEST WITH CONTRAST  TECHNIQUE: Multidetector CT imaging of the chest was performed using the standard protocol during bolus administration of intravenous contrast. Multiplanar CT image reconstructions and MIPs were obtained to evaluate the vascular anatomy.  CONTRAST:100 mL Omnipaque 350  COMPARISON:CT Abdomen and Pelvis 07/05/2008  FINDINGS: Cardiovascular: Good contrast bolus timing in the pulmonary arterial tree.  No focal filling defect identified in the pulmonary arteries to suggest acute pulmonary embolism.  No cardiomegaly or pericardial effusion. Negative visualized aorta. No calcified coronary artery atherosclerosis is evident.  Mediastinum/Nodes: Negative.No lymphadenopathy.  Lungs/Pleura: Major airways  are patent. The small 3-4 mm subpleural pulmonary nodule in the superior segment of the right lower lobe on series 6, image 44. Larger 6-7 mm subpleural nodule in the lateral right lower lobe on image 57 is stable since 2009 and benign. Small 2-3 mm subpleural nodules in the right costophrenic angle ((image 90) are stable since 2009. Mild dependent atelectasis in the lower lobes. No pleural effusion.  Upper Abdomen: Surgically absent gallbladder. Negative visible liver, spleen, pancreas, adrenal glands, kidneys, and bowel in the upper abdomen.  Musculoskeletal: No acute osseous abnormality identified.   MRI of Brain 12/2016 CLINICAL DATA:Left arm paresthesias and shortness breath. Difficulty walking from the better to the bathroom. The patient refused completion of the exam secondary shortness of breath.  EXAM: MRI HEAD WITHOUT CONTRAST  TECHNIQUE: Multiplanar,  multiecho pulse sequences of the brain and surrounding structures were obtained without intravenous contrast.  COMPARISON:CT head without contrast 02/28/2015  FINDINGS: Brain: No acute infarct, hemorrhage, or mass lesion is present. The ventricles are of normal size. No significant extraaxial fluid collection is present.  Vascular: Flow is present in the major intracranial arteries.  Skull and upper cervical spine: The skullbase is within normal limits. The craniocervical junction is normal. Midline sagittal structures are unremarkable.  Sinuses/Orbits: The paranasal sinuses and mastoid air cells are clear. Globes and orbits are unremarkable.  ASSESSMENT AND PLAN:  1.  DOE  --No PE, No CVA with Lt arm numbness. No cardiac calcification on CTA of chest.  Will check echo and if normal will do ETT to eval HR and how well she does with exertion.  Also checking for inappropriate tachycardia.  Dr. Okey Dupre did discuss with her as well.    If all negative we will call her and give results and have her follow with her PCP  and possible pulmonology.  Will check TSH and Free T4. .  2. Lt arm numbness and Lt hand weakness.  If our exam neg, will refer back to PCP She does have Lumbar disc disease.  I do not see any films of neck.    3. Tachycardia with symptoms  Current medicines are reviewed with the patient today.  The patient Has no concerns regarding medicines.  The following changes have been made:  See above Labs/ tests ordered today include:see above  Disposition:   FU:  see above  Signed, Nada Boozer, NP  02/26/2017 6:50 PM    [facility] [address] [place], [place], [place]  [address]/ [address] [place], [place] Phone: [phone]; Fax: [phone]  [phone]

## 2017-02-26 ENCOUNTER — Ambulatory Visit (INDEPENDENT_AMBULATORY_CARE_PROVIDER_SITE_OTHER): Payer: Medicare Other | Admitting: Cardiology

## 2017-02-26 ENCOUNTER — Other Ambulatory Visit: Payer: Self-pay | Admitting: *Deleted

## 2017-02-26 ENCOUNTER — Encounter: Payer: Self-pay | Admitting: Cardiology

## 2017-02-26 VITALS — BP 126/86 | HR 66 | Ht <= 58 in | Wt 148.0 lb

## 2017-02-26 DIAGNOSIS — R000 Tachycardia, unspecified: Secondary | ICD-10-CM | POA: Diagnosis not present

## 2017-02-26 DIAGNOSIS — R200 Anesthesia of skin: Secondary | ICD-10-CM

## 2017-02-26 DIAGNOSIS — R0600 Dyspnea, unspecified: Secondary | ICD-10-CM

## 2017-02-26 LAB — TSH: TSH: 0.425 u[IU]/mL — ABNORMAL LOW (ref 0.450–4.500)

## 2017-02-26 LAB — T4, FREE: FREE T4: 1 ng/dL (ref 0.82–1.77)

## 2017-02-26 NOTE — Patient Instructions (Signed)
Your physician recommends that you continue on your current medications as directed. Please refer to the Current Medication list given to you today. Your physician has requested that you have an echocardiogram. Echocardiography is a painless test that uses sound waves to create images of your heart. It provides your doctor with information about the size and shape of your heart and how well your heart's chambers and valves are working. This procedure takes approximately one hour. There are no restrictions for this procedure. Your physician has requested that you have an exercise tolerance test. For further information please visit [email or web address]. Please also follow instruction sheet, as given.  WEEK  AFTER ECHO IF  ECHO OKAY  Your physician recommends that you return for lab work in: TODAY TSH AND FREE T4  Your physician recommends that you schedule a follow-up appointment in: PENDING TEST RESULTS

## 2017-03-03 ENCOUNTER — Ambulatory Visit (HOSPITAL_BASED_OUTPATIENT_CLINIC_OR_DEPARTMENT_OTHER)
Admission: RE | Admit: 2017-03-03 | Discharge: 2017-03-03 | Disposition: A | Discharge status: Home / Self Care | Payer: Medicare Other | Source: Ambulatory Visit | Attending: Medical | Admitting: Medical

## 2017-03-03 ENCOUNTER — Encounter: Payer: Self-pay | Admitting: Medical

## 2017-03-03 ENCOUNTER — Ambulatory Visit (INDEPENDENT_AMBULATORY_CARE_PROVIDER_SITE_OTHER): Payer: Medicare Other | Admitting: Medical

## 2017-03-03 ENCOUNTER — Telehealth: Payer: Self-pay | Admitting: Medical

## 2017-03-03 VITALS — BP 142/95 | HR 83 | Temp 98.2°F | Resp 16 | Ht <= 58 in | Wt 148.2 lb

## 2017-03-03 DIAGNOSIS — M5030 Other cervical disc degeneration, unspecified cervical region: Secondary | ICD-10-CM | POA: Diagnosis not present

## 2017-03-03 DIAGNOSIS — M545 Low back pain, unspecified: Secondary | ICD-10-CM

## 2017-03-03 DIAGNOSIS — Z8709 Personal history of other diseases of the respiratory system: Secondary | ICD-10-CM | POA: Diagnosis not present

## 2017-03-03 DIAGNOSIS — R946 Abnormal results of thyroid function studies: Secondary | ICD-10-CM | POA: Diagnosis not present

## 2017-03-03 DIAGNOSIS — S46812A Strain of other muscles, fascia and tendons at shoulder and upper arm level, left arm, initial encounter: Secondary | ICD-10-CM

## 2017-03-03 DIAGNOSIS — M542 Cervicalgia: Secondary | ICD-10-CM | POA: Insufficient documentation

## 2017-03-03 DIAGNOSIS — R202 Paresthesia of skin: Secondary | ICD-10-CM

## 2017-03-03 DIAGNOSIS — R0600 Dyspnea, unspecified: Secondary | ICD-10-CM

## 2017-03-03 DIAGNOSIS — R7989 Other specified abnormal findings of blood chemistry: Secondary | ICD-10-CM

## 2017-03-03 MED ORDER — ALBUTEROL SULFATE HFA 108 (90 BASE) MCG/ACT IN AERS: 2.0000 | INHALATION_SPRAY | Freq: Four times a day (QID) | RESPIRATORY_TRACT | 0 refills | Status: DC | PRN

## 2017-03-03 MED ORDER — KETOROLAC TROMETHAMINE 10 MG PO TABS: 10.0000 mg | ORAL_TABLET | Freq: Four times a day (QID) | ORAL | 0 refills | Status: DC | PRN

## 2017-03-03 NOTE — Progress Notes (Signed)
Subjective:    Patient ID: Phyllis Adams, female    DOB: 1975/09/22, 41 y.o.   MRN: [id number]  HPI  Pt in for follow up.  Pt recently seen by cardiologist office. Pt sent over for her random episodes of intermittent episodes of shortness of breath that last 15-20 minutes. At times in the past associated with left arm tingling. Seems to start in left trapezius area.  Pt denies any chest pain associated with. Pt last episode happened early this am with no reoccurrence. She also notes some episodes of sob on walking in past. These symptoms have been going on for one month.  Pt had work up in ED over past month.  She has had recent hx of dyspnea since April per ER note and lt arm pain. She was seen at [facility] in April SOB with walking short distance.  + tobacco use, and was on Mobic.  EKG in ER with SR prolonged QT plssible LA enlargement. No significant change MRI of brain, no acute infarct, or process.   CTA of chest without PE, rt LL nodules stable.   Troponin <0.01 ,no DVT of Rt. lower ext.   I also did ekg last visit and looked normal.  Pt states next appointment with cardioloigst is July 11th, and then they will get treadmill and echo.  When she gets mild sob episodes she thinks HR may increase.  On review pt may have had asthma as youth. But then mom stated she grew out of. She heard herself wheeze one time with dyspnea episodes.  Pt had mild tsh decrease but normal t4.(just recently)  Pt also has history of back pain and some rt thigh pain. Pt states side effect from meloxicam was sob but she has been off of for 2 weeks of more. Relafen was not covered. Off on on level 5 pain.     Review of Systems  Constitutional: Negative for chills, fatigue and fever.  Respiratory: Positive for shortness of breath. Negative for cough and chest tightness.        See hpi  Cardiovascular: Negative for chest pain and palpitations.  Gastrointestinal: Negative for abdominal pain.    Musculoskeletal: Positive for back pain.       Left arm tingles at times but not now. See hpi.  Neurological: Negative for dizziness, seizures, weakness and headaches.  Hematological: Negative for adenopathy. Does not bruise/bleed easily.  Psychiatric/Behavioral: Negative for behavioral problems and sleep disturbance.   Past Medical History:  Diagnosis Date  . Depression   . Femur fracture (HCC) 2006  . Hypertension   . Migraine   . Mood disorder [facility])      Social History   Social History  . Marital status: Legally Separated    Spouse name: N/A  . Number of children: N/A  . Years of education: N/A   Occupational History  . Not on file.   Social History Main Topics  . Smoking status: Current Every Day Smoker    Packs/day: 0.00    Types: Cigars  . Smokeless tobacco: Never Used     Comment: 3 cigars with marijuiana  . Alcohol use No  . Drug use: Yes    Types: Marijuana  . Sexual activity: Yes    Birth control/ protection: Other-see comments, Surgical   Other Topics Concern  . Not on file   Social History Narrative  . No narrative on file    Past Surgical History:  Procedure Laterality Date  . APPENDECTOMY    .  CHOLECYSTECTOMY    . FEMUR FRACTURE SURGERY Right 2006  . FEMUR IM ROD REMOVAL Right 03/11/2016  . TUBAL LIGATION      Family History  Problem Relation Age of Onset  . Hypertension Mother   . Hypertension Maternal Aunt   . Hypertension Paternal Uncle   . Hypertension Maternal Grandmother   . Hypertension Maternal Grandfather     Allergies  Allergen Reactions  . Tramadol Shortness Of Breath and Nausea And Vomiting  . Hydrocodone-Acetaminophen Other (See Comments)    Sweats, heavy breathing  . Hydrocodone   . Acetaminophen-Codeine Nausea Only    Other reaction(s): NAUSEA    Current Outpatient Prescriptions on File Prior to Visit  Medication Sig Dispense Refill  . Butalbital-APAP-Caffeine (FIORICET) 50-300-40 MG CAPS 1 tab po q 8 hrs as  needed HA 30 capsule 0  . cyclobenzaprine (FLEXERIL) 10 MG tablet Take 1 tablet (10 mg total) by mouth 3 (three) times daily as needed for muscle spasms. 30 tablet 0  . fluticasone (FLONASE) 50 MCG/ACT nasal spray Place 2 sprays into both nostrils daily. 16 g 1  . ondansetron (ZOFRAN-ODT) 8 MG disintegrating tablet DISSOLVE 1 TABLET(8 MG) ON THE TONGUE EVERY 8 HOURS AS NEEDED FOR NAUSEA OR VOMITING 15 tablet 0  . SUMAtriptan (IMITREX) 50 MG tablet .1 tab po at onset of ha. Repeat in 2 hours if ha persists(max number of tabs is 2 in any 24 hour period) 10 tablet 0   No current facility-administered medications on file prior to visit.     BP (!) 142/95 (BP Location: Left Arm, Patient Position: Sitting, Cuff Size: Large)   Pulse 83   Temp 98.2 F (36.8 C) (Oral)   Resp 16   Ht 4\' 10"  (1.473 m)   Wt 148 lb 3.2 oz (67.2 kg)   LMP 02/08/2017   SpO2 100%   BMI 30.97 kg/m       Objective:   Physical Exam   General- No acute distress. Pleasant patient. Neck- Full range of motion, no jvd. Left side trapezius pain on palpation. Lungs- Clear, even and unlabored. Heart- regular rate and rhythm. Neurologic- CNII- XII grossly intact.  Left upper ext- +phalen sign. Good rom and normal strength.  Back-rt side si tenderness to palpation.no mid line tender in lumbar area today  Lower ext-l5-s1 sensation intact. Eequal and symmetric 5/5 strength.      Assessment & Plan:  For your sob episodes please follow through with cardiologist work up.  With remote possible asthma history I want to make albuterol available. Use if sob and notify us of response.  For trapezius pain and left hand tingling will get cspine xray.   Will put in future tsh and t4 today. Get done in 3 months.  For low back pain toradol tabs and try to refer back to neurosurgeon for epidural.  If any dramatic/severe symptoms of sob or chest pain then ED evaluation.   Follow up in 7-10 days or as needed  Hailey Miles,  [name], VF CorporationPA-C

## 2017-03-03 NOTE — Patient Instructions (Addendum)
For your sob episodes please follow through with cardiologist work up.  With remote possible asthma history I want to make albuterol available. Use if sob and notify us of response.  For trapezius pain and left hand tingling will get cspine xray.(also advise try wrist cock up splint)  Will put in future tsh and t4 today. Get done in 3 months.  For low back pain toradol tabs and try to refer back to neurosurgeon for epidural.  If any dramatic/severe symptoms of sob or chest pain then ED evaluation.   Follow up in 7-10 days or as needed

## 2017-03-03 NOTE — Telephone Encounter (Signed)
Caller name: Daphine DeutscherRasheda Harris Relationship to patient: self Can be reached: [phone] [facility] [address] [facility]  Reason for call: Pt needing med changed please. (I placed form on Jasmine's desk that pharmacy faxed)

## 2017-03-04 ENCOUNTER — Ambulatory Visit (INDEPENDENT_AMBULATORY_CARE_PROVIDER_SITE_OTHER): Payer: Medicare Other

## 2017-03-04 ENCOUNTER — Telehealth: Payer: Self-pay

## 2017-03-04 DIAGNOSIS — M545 Low back pain, unspecified: Secondary | ICD-10-CM

## 2017-03-04 MED ORDER — KETOROLAC TROMETHAMINE 60 MG/2ML IM SOLN
60.0000 mg | Freq: Once | INTRAMUSCULAR | Status: AC
  Administered 2017-03-04: 60 mg via INTRAMUSCULAR

## 2017-03-04 NOTE — Telephone Encounter (Signed)
Pt given Toradol injection to day in nurse visit.

## 2017-03-04 NOTE — Telephone Encounter (Signed)
Patient would like to know which medication she will be able to take for her back pain  that will be covered under her insurance.

## 2017-03-04 NOTE — Progress Notes (Addendum)
Pre visit review using our clinic tool,if applicable. No additional management support is needed unless otherwise documented below in the visit note.   Patient in for Toradol 60 mg injection due to complaint of back pain.   Given 60 mg IM Right hip.Patient tolerated well.   I did order the toradol 60 mg IM injection.  [name], [name], PA-C

## 2017-03-08 ENCOUNTER — Telehealth: Payer: Self-pay | Admitting: Medical

## 2017-03-08 MED ORDER — ETODOLAC 200 MG PO CAPS: 200.0000 mg | ORAL_CAPSULE | Freq: Three times a day (TID) | ORAL | 1 refills | Status: DC

## 2017-03-08 NOTE — Telephone Encounter (Signed)
I tried to prescribe lodine. Sent to pt pharmacy. Have her dc toradol tabs. I wrote that last week. Did she try oral toradol or was not covered by insurance. Let me know please.

## 2017-03-09 ENCOUNTER — Telehealth: Payer: Self-pay | Admitting: Medical

## 2017-03-09 NOTE — Telephone Encounter (Addendum)
Pt stated that the Toradol was not covered by her insurance. She plans to pick up the prescription for Lodine later today.  She was advised to call back if medication does not help with pain.  She stated understanding and agreed.

## 2017-03-09 NOTE — Telephone Encounter (Addendum)
Relation to ZO:XWRUpt:self Call back number:[phone] Pharmacy: [id number] - [place], [place] - [address]  Reason for call:  Patient states Rx sent to pharmacy is not covered by insurance (patient doesn't know the name) and would like PA to prescribe meloxicam (MOBIC) 15 MG tablet, please advise

## 2017-03-12 ENCOUNTER — Other Ambulatory Visit: Payer: Self-pay | Admitting: Family

## 2017-03-12 ENCOUNTER — Telehealth: Payer: Self-pay | Admitting: Family Medicine

## 2017-03-12 ENCOUNTER — Telehealth: Payer: Self-pay | Admitting: Medical

## 2017-03-12 NOTE — Telephone Encounter (Signed)
Patient called to request an order to be placed for [facility] States she had an OV with her PCP last week concerning her R leg and PCP suggested to contact us for another [facility]   Patient has not been seen since 07/2016, informed patient she may need OV before order is placed

## 2017-03-12 NOTE — Telephone Encounter (Signed)
Yes we'd have to see her back to reevaluate given how long it's been, make sure exam and history are consistent with the same issue

## 2017-03-12 NOTE — Telephone Encounter (Signed)
Will you talk with him or his staff as to where he would have referred her. She needs to be sent back to them. But we don't have info. If I am not mistaken he referred her. Could you call them today before they leave for the weekend.

## 2017-03-12 NOTE — Telephone Encounter (Signed)
Patient is scheduled for next Thursday, 7/12

## 2017-03-12 NOTE — Telephone Encounter (Signed)
I will have to call Dr. Lazaro ArmsHudnall's office Monday. They usually have availability within a couple days so I'll see if they can call pt to schedule her.

## 2017-03-14 ENCOUNTER — Telehealth: Payer: Self-pay | Admitting: Medical

## 2017-03-14 MED ORDER — PIROXICAM 20 MG PO CAPS: 20.0000 mg | ORAL_CAPSULE | Freq: Every day | ORAL | 0 refills | Status: DC

## 2017-03-14 NOTE — Telephone Encounter (Signed)
I have been trying varius nsaids to help with her pain. But her insurance does not cover any although have chosen generic. Pt chart states allergy to hydrocodone and tramadol. Also some question if she had allergy to meloxicam. So recently tried to send feldene over the weekend to her pharmacy. Advise pt to see if this is covered. I am trying to get her back in with Dr. Pearletha ForgeHudnall and trying to refer back to specailist office who did her epidurals for her back pain.Sorry she is in pain and that insurance not been covering relatively common and most of time inexpensive meds?  Will you call Dr. Andree CossHudnell office to see if they know who is specialist that did her epidurals?

## 2017-03-15 ENCOUNTER — Telehealth: Payer: Self-pay | Admitting: Medical

## 2017-03-15 ENCOUNTER — Encounter: Payer: Self-pay | Admitting: Medical

## 2017-03-15 NOTE — Telephone Encounter (Addendum)
Per chart review patient goes to [facility] for epidural injections.  Called Gunnar FusiPaula at Dr. Lazaro ArmsHudnall's office.  She confirmed [facility], however, the provider of the injections may be a different provider each time.    In reviewing patient's chart, it was also noted, that patient sent a MyHChart message today (03/15/17) requesting a nurse visit for a shot until her appt with Dr. Marja KaysHudall on the 12th.  States she's in pain.  Please advise.

## 2017-03-15 NOTE — Telephone Encounter (Signed)
Please advise 

## 2017-03-15 NOTE — Telephone Encounter (Signed)
Would you ask sports med to help Phyllis Adams coordinate getting her in with [facility] for the epidural.Thanks.they are seeing her this week.

## 2017-03-15 NOTE — Telephone Encounter (Signed)
Patient is checking the status of her mychart request of injection  [phone]

## 2017-03-15 NOTE — Telephone Encounter (Signed)
Can we get pt in for nurse visit toradol 60 mg im injection for back pain. We have been trying to refer her to specialist and many of rx nsaids I have tried to write not covered by insurance. So will you put her on schedule for tomorrow and give injection.

## 2017-03-16 ENCOUNTER — Ambulatory Visit (INDEPENDENT_AMBULATORY_CARE_PROVIDER_SITE_OTHER): Payer: Medicare Other

## 2017-03-16 ENCOUNTER — Other Ambulatory Visit: Payer: Self-pay

## 2017-03-16 ENCOUNTER — Telehealth (HOSPITAL_COMMUNITY): Payer: Self-pay | Admitting: Cardiology

## 2017-03-16 DIAGNOSIS — M545 Low back pain, unspecified: Secondary | ICD-10-CM

## 2017-03-16 MED ORDER — KETOROLAC TROMETHAMINE 30 MG/ML IJ SOLN: 30.0000 mg | Freq: Once | INTRAMUSCULAR | 0 refills | Status: AC

## 2017-03-16 MED ORDER — CYCLOBENZAPRINE HCL 10 MG PO TABS: 10.0000 mg | ORAL_TABLET | Freq: Three times a day (TID) | ORAL | 0 refills | Status: DC | PRN

## 2017-03-16 MED ORDER — KETOROLAC TROMETHAMINE 60 MG/2ML IM SOLN
60.0000 mg | Freq: Once | INTRAMUSCULAR | Status: AC
  Administered 2017-03-16: 60 mg via INTRAMUSCULAR

## 2017-03-16 NOTE — Progress Notes (Signed)
   Subjective:    Patient ID: Phyllis Adams, female    DOB: 07/08/1976, 41 y.o.   MRN: [id number]  HPI Pt came in for toradol 60 mg im. Not office visit. But me given to give pain relief pending sportsmed appointment. I believe in 2 days. All oral nsaids recently written not covered by insurance.   Review of Systems     Objective:   Physical Exam        Assessment & Plan:

## 2017-03-16 NOTE — Telephone Encounter (Signed)
Done

## 2017-03-16 NOTE — Telephone Encounter (Signed)
Pt's appt with Dr. Pearletha ForgeHudnall on the 7/12 is to evaluate patient's need for epidural injection.  See phone note dated 03/12/17

## 2017-03-16 NOTE — Telephone Encounter (Signed)
Per Esperanza RichtersEdward [name] (see phone note dated: 03/15/17) Can we get pt in for nurse visit toradol 60 mg im injection for back pain. We have been trying to refer her to specialist and many of rx nsaids I have tried to write not covered by insurance. So will you put her on schedule for tomorrow and give injection.

## 2017-03-16 NOTE — Telephone Encounter (Signed)
Appt scheduled for today 03/16/17 @ 9:30 am.

## 2017-03-16 NOTE — Telephone Encounter (Signed)
Refills sent

## 2017-03-17 ENCOUNTER — Other Ambulatory Visit (HOSPITAL_COMMUNITY): Payer: Medicare Other

## 2017-03-18 ENCOUNTER — Encounter: Payer: Self-pay | Admitting: Family Medicine

## 2017-03-18 ENCOUNTER — Telehealth: Payer: Self-pay | Admitting: Medical

## 2017-03-18 ENCOUNTER — Ambulatory Visit (INDEPENDENT_AMBULATORY_CARE_PROVIDER_SITE_OTHER): Payer: Medicare Other | Admitting: Family Medicine

## 2017-03-18 DIAGNOSIS — M4726 Other spondylosis with radiculopathy, lumbar region: Secondary | ICD-10-CM

## 2017-03-18 NOTE — Patient Instructions (Signed)
We will set you up for an epidural steroid injection. If you get benefit with the shots you can repeat these for a total of 3 spaced 2 weeks apart. Call me a week after the injection to let me know how you're doing. You can consider revisiting things you've tried in the past (prednisone, physical therapy) as well.

## 2017-03-18 NOTE — Telephone Encounter (Signed)
Caller name: Arlana HoveRasheda Relation to pt: self Call back number: [phone] Pharmacy:  Reason for call: Pt came in office stating saw today 03-18-2017 Dr Pearletha ForgeHudnall ([facility]) pt is wanting to know if she can get a Toradal 60 mg inj again since she does not know when she getting her epidural. Please advise.

## 2017-03-19 NOTE — Telephone Encounter (Signed)
Last injection: 03/16/17.  Saw Dr. Pearletha ForgeHudnall yesterday (03/18/17).  Please advise.

## 2017-03-19 NOTE — Assessment & Plan Note (Signed)
Has improved previously with ESI for S1 radiculopathy - she would like to repeat this so will set it up.  Call us a week after this.  We discussed repeating physical therapy, considering prednisone dose pack as well.  Continue meloxicam.

## 2017-03-19 NOTE — Progress Notes (Signed)
PCP: Esperanza RichtersSaguier, Edward, PA-C Consultation requested by Malva Coganody Martin PA-C  Subjective:   HPI: Patient is a 41 y.o. female here for right leg pain.  07/14/16: Patient has 4 month history of right leg pain. Pain is 10/10 level, sharp and burning. Radiates from buttocks down to ankle circumferentially. Difficulty sleeping, using restroom due to pain. Has tried heat, ice, tylenol arthritis, prednisone (has 1 more day of this medication to take), lyrica 200 bid, flector patch, tramadol, lidocaine patches, TENS unit. No bowel/bladder dysfunction. No skin changes. Doppler u/s on 11/2 negative for DVT She recently had hardware removed from this hip from orthopedics (in place for 10 years). 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 She has a neurologist but next appointment there is about a month from now, no availability sooner.  03/18/17: Patient returns noting pain has started back over past few weeks into her right leg. Injections have helped with most recent ESI being in January. Pain radiates from right low back/buttock down to foot. Associated numbness same distribution. Pain level up to 7/10 and sharp. Taking meloxicam and tried a muscle relaxant. No bowel/bladder dysfunction. Had a toradol injection at PCP's office which helped for a day.  Past Medical History:  Diagnosis Date  . Depression   . Femur fracture (HCC) 2006  . Hypertension   . Migraine   . Mood disorder [facility](HCC)     Current Outpatient Prescriptions on File Prior to Visit  Medication Sig Dispense Refill  . albuterol (PROVENTIL HFA;VENTOLIN HFA) 108 (90 Base) MCG/ACT inhaler Inhale 2 puffs into the lungs every 6 (six) hours as needed for wheezing or shortness of breath. 1 Inhaler 0  . Butalbital-APAP-Caffeine (FIORICET) 50-300-40 MG CAPS 1 tab po q 8 hrs as needed HA 30 capsule 0  . cyclobenzaprine (FLEXERIL) 10 MG tablet Take 1 tablet (10 mg total) by  mouth 3 (three) times daily as needed for muscle spasms. 30 tablet 0  . fluticasone (FLONASE) 50 MCG/ACT nasal spray Place 2 sprays into both nostrils daily. 16 g 1  . ondansetron (ZOFRAN-ODT) 8 MG disintegrating tablet DISSOLVE 1 TABLET(8 MG) ON THE TONGUE EVERY 8 HOURS AS NEEDED FOR NAUSEA OR VOMITING 15 tablet 0  . piroxicam (FELDENE) 20 MG capsule Take 1 capsule (20 mg total) by mouth daily. 30 capsule 0  . SUMAtriptan (IMITREX) 50 MG tablet .1 tab po at onset of ha. Repeat in 2 hours if ha persists(max number of tabs is 2 in any 24 hour period) 10 tablet 0   No current facility-administered medications on file prior to visit.     Past Surgical History:  Procedure Laterality Date  . APPENDECTOMY    . CHOLECYSTECTOMY    . FEMUR FRACTURE SURGERY Right 2006  . FEMUR IM ROD REMOVAL Right 03/11/2016  . TUBAL LIGATION      Allergies  Allergen Reactions  . Tramadol Shortness Of Breath and Nausea And Vomiting  . Hydrocodone-Acetaminophen Other (See Comments)    Sweats, heavy breathing  . Hydrocodone   . Acetaminophen-Codeine Nausea Only    Other reaction(s): NAUSEA    Social History   Social History  . Marital status: Legally Separated    Spouse name: N/A  . Number of children: N/A  . Years of education: N/A   Occupational History  . Not on file.   Social History Main Topics  . Smoking status: Current Every Day Smoker    Packs/day: 0.00  Types: Cigars  . Smokeless tobacco: Never Used     Comment: 3 cigars with marijuiana  . Alcohol use No  . Drug use: Yes    Types: Marijuana  . Sexual activity: Yes    Birth control/ protection: Other-see comments, Surgical   Other Topics Concern  . Not on file   Social History Narrative  . No narrative on file    Family History  Problem Relation Age of Onset  . Hypertension Mother   . Hypertension Maternal Aunt   . Hypertension Paternal Uncle   . Hypertension Maternal Grandmother   . Hypertension Maternal Grandfather      BP (!) 137/93   Pulse 83   Ht 4\' 10"  (1.473 m)   Wt 148 lb (67.1 kg)   BMI 30.93 kg/m   Review of Systems: See HPI above.     Objective:  Physical Exam:  Gen: NAD, comfortable in exam room  Back/right hip: No gross deformity, scoliosis. TTP diffusely very low lumbar spine, hip, buttock.  No midline or bony TTP. FROM hip without pain. Strength LEs 5/5 all muscle groups.   Trace MSRs in patellar and achilles tendons, equal bilaterally. Negative SLRs. Sensation intact to light touch bilaterally. Negative logroll bilateral hips Negative fabers and piriformis stretches.   Assessment & Plan:  1. Right leg pain - Has improved previously with ESI for S1 radiculopathy - she would like to repeat this so will set it up.  Call us a week after this.  We discussed repeating physical therapy, considering prednisone dose pack as well.  Continue meloxicam.

## 2017-03-22 ENCOUNTER — Telehealth: Payer: Self-pay | Admitting: Medical

## 2017-03-22 NOTE — Telephone Encounter (Signed)
Opened to review 

## 2017-03-22 NOTE — Telephone Encounter (Signed)
Will you investigate when pt epidural appointment is. Please call specialist office so we can get idea. Since she continues to have back pain. I believe Dr. Pearletha ForgeHudnall has intiated referral as I never new where she went to in the past.

## 2017-03-22 NOTE — Telephone Encounter (Signed)
Pt needs to have a appointment. I have given pt toradol various times as an injection. I thinks I have already had her come in twice with just nurse visit. But this time she needs to be seen. I need to discuss what med I might be able to write orally as everything I have written is not covered. Want to discuss her prior use of meloxicam. Has she used that recently? Want to discuss her thought that she had allergy to meloxicam in the past.  Investigating what hold up is on epidural?  Schedule for wednesday

## 2017-03-23 NOTE — Telephone Encounter (Signed)
Has this patient been referred by Dr Pearletha ForgeHudnall for back injections?

## 2017-03-23 NOTE — Telephone Encounter (Signed)
Dr Lazaro [facility]udnall[facility] to schedule

## 2017-03-23 NOTE — Telephone Encounter (Signed)
Noted. Patient scheduled to see [name] tomorrow (03/24/17) at 11 am.

## 2017-03-24 ENCOUNTER — Ambulatory Visit (INDEPENDENT_AMBULATORY_CARE_PROVIDER_SITE_OTHER): Payer: Medicare Other | Admitting: Medical

## 2017-03-24 ENCOUNTER — Other Ambulatory Visit: Payer: Self-pay | Admitting: Family Medicine

## 2017-03-24 ENCOUNTER — Telehealth: Payer: Self-pay | Admitting: *Deleted

## 2017-03-24 ENCOUNTER — Encounter: Payer: Self-pay | Admitting: Medical

## 2017-03-24 VITALS — BP 144/79 | HR 88 | Temp 98.3°F | Resp 16 | Ht 60.0 in | Wt 148.4 lb

## 2017-03-24 DIAGNOSIS — M545 Low back pain, unspecified: Secondary | ICD-10-CM

## 2017-03-24 DIAGNOSIS — M79651 Pain in right thigh: Secondary | ICD-10-CM

## 2017-03-24 DIAGNOSIS — G8929 Other chronic pain: Secondary | ICD-10-CM

## 2017-03-24 MED ORDER — KETOROLAC TROMETHAMINE 60 MG/2ML IM SOLN
60.0000 mg | Freq: Once | INTRAMUSCULAR | Status: AC
  Administered 2017-03-24: 60 mg via INTRAMUSCULAR

## 2017-03-24 NOTE — Patient Instructions (Signed)
For your low back pain and thigh region pain will give you toradol 60 mg im today.  Continue the flexeril  Attend appointment for epidural injection on April 01, 2017.  Will assess home health form and fill it out. Hopefully the epidural will work as prior one did and will limit need for home health.   Follow up to be determined after Epidural injection. But come in sooner if needed

## 2017-03-24 NOTE — Telephone Encounter (Signed)
Called pt and lmsg for her to CB to r/s echo and ETT.

## 2017-03-24 NOTE — Progress Notes (Signed)
Subjective:    Patient ID: Phyllis Adams, female    DOB: 09/19/1975, 41 y.o.   MRN: [id number]  HPI   Pt in with recurrent back and rt thigh area pain.  Hx of below findings on back ct. 1. No acute abnormality within the lumbar spine. 2. 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 No significant stenosis identified on this limited noncontrast CT. Further evaluation with dedicated MRI would likely be helpful for complete evaluation. 3. 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  Hx of rt lower ext rod  In femur with pain and was eventually removed surgically.  Pt had recent moderate to severe flare of prior pain.  In past had responded to epidural injections. With recent increase in pain we have been trying to get her in with specialist who did prior epidural.  I had also  been trying to find nsaids that was reasonable priced and worked.   At one point in past pt thought meloxicam was causing shortness of breath. So that was dc'd.  Currently she is using muscle relaxant and occasional toradol im. Toradol tablets were not covered by insurance. As well as 3 other nsaids which I thought would be reasonable.  Pt has allergy to hydrocodone and tramadol. So again have relying on toradol injections.  Pt states pain is moderate to severe and beginning to shoot down her rt leg again as it did before.  Pt states it is beginning to get difficult to daily activities around the house. She wants me to fill out home health. She states in past 3 months before she had surgery to remove rod in rt femur she had problems with acitivity of daily living.  Pt can only work part time. 4 hours at most presently. Sometimes can't even work 4 hours.   Pt is goig to have epidural again on April 01, 2017.   Review of Systems  Constitutional: Negative for chills, fatigue and fever.  Respiratory: Negative for cough, chest  tightness, shortness of breath and wheezing.   Cardiovascular: Negative for chest pain.  Gastrointestinal: Negative for abdominal pain.  Genitourinary: Negative for dysuria, flank pain and urgency.  Musculoskeletal: Positive for back pain.       Rt thigh region pain.  Skin: Negative for rash.  Neurological:       Some radiating pain at times from back to her rt foot.  Hematological: Negative for adenopathy. Does not bruise/bleed easily.  Psychiatric/Behavioral: Negative for behavioral problems.   Past Medical History:  Diagnosis Date  . Depression   . Femur fracture (HCC) 2006  . Hypertension   . Migraine   . Mood disorder [facility](HCC)      Social History   Social History  . Marital status: Legally Separated    Spouse name: N/A  . Number of children: N/A  . Years of education: N/A   Occupational History  . Not on file.   Social History Main Topics  . Smoking status: Current Every Day Smoker    Packs/day: 0.00    Types: Cigars  . Smokeless tobacco: Never Used     Comment: 3 cigars with marijuiana  . Alcohol use No  . Drug use: Yes    Types: Marijuana  . Sexual activity: Yes    Birth control/ protection: Other-see comments, Surgical   Other Topics Concern  . Not on file   Social History Narrative  . No narrative on file  Past Surgical History:  Procedure Laterality Date  . APPENDECTOMY    . CHOLECYSTECTOMY    . FEMUR FRACTURE SURGERY Right 2006  . FEMUR IM ROD REMOVAL Right 03/11/2016  . TUBAL LIGATION      Family History  Problem Relation Age of Onset  . Hypertension Mother   . Hypertension Maternal Aunt   . Hypertension Paternal Uncle   . Hypertension Maternal Grandmother   . Hypertension Maternal Grandfather     Allergies  Allergen Reactions  . Tramadol Shortness Of Breath and Nausea And Vomiting  . Hydrocodone-Acetaminophen Other (See Comments)    Sweats, heavy breathing  . Hydrocodone   . Acetaminophen-Codeine Nausea Only    Other reaction(s):  NAUSEA    Current Outpatient Prescriptions on File Prior to Visit  Medication Sig Dispense Refill  . albuterol (PROVENTIL HFA;VENTOLIN HFA) 108 (90 Base) MCG/ACT inhaler Inhale 2 puffs into the lungs every 6 (six) hours as needed for wheezing or shortness of breath. 1 Inhaler 0  . Butalbital-APAP-Caffeine (FIORICET) 50-300-40 MG CAPS 1 tab po q 8 hrs as needed HA 30 capsule 0  . cyclobenzaprine (FLEXERIL) 10 MG tablet Take 1 tablet (10 mg total) by mouth 3 (three) times daily as needed for muscle spasms. 30 tablet 0  . fluticasone (FLONASE) 50 MCG/ACT nasal spray Place 2 sprays into both nostrils daily. 16 g 1  . meloxicam (MOBIC) 15 MG tablet TK 1 T PO D  3  . ondansetron (ZOFRAN-ODT) 8 MG disintegrating tablet DISSOLVE 1 TABLET(8 MG) ON THE TONGUE EVERY 8 HOURS AS NEEDED FOR NAUSEA OR VOMITING 15 tablet 0  . SUMAtriptan (IMITREX) 50 MG tablet .1 tab po at onset of ha. Repeat in 2 hours if ha persists(max number of tabs is 2 in any 24 hour period) 10 tablet 0   No current facility-administered medications on file prior to visit.     BP (!) 144/79   Pulse 88   Temp 98.3 F (36.8 C) (Oral)   Resp 16   Ht 5' (1.524 m)   Wt 148 lb 6.4 oz (67.3 kg)   SpO2 100%   BMI 28.98 kg/m       Objective:   Physical Exam  General Appearance- Not in acute distress.    Chest and Lung Exam Auscultation: Breath sounds:-Normal. Clear even and unlabored. Adventitious sounds:- No Adventitious sounds.  Cardiovascular Auscultation:Rythm - Regular, rate and rythm. Heart Sounds -Normal heart sounds.  Abdomen Inspection:-Inspection Normal.  Palpation/Perucssion: Palpation and Percussion of the abdomen reveal- Non Tender, No Rebound tenderness, No rigidity(Guarding) and No Palpable abdominal masses.  Liver:-Normal.  Spleen:- Normal.   Back Mid lumbar spine tenderness to palpation. Pain on straight leg lift(rt side). Pain on lateral movements and flexion/extension of the spine.  Lower ext  neurologic  L5-S1 sensation intact bilaterally. Normal patellar reflexes bilaterally. No foot drop bilaterally.  Rt hip- pain on rom and pain reported in rt hip area     Assessment & Plan:  For your low back pain and thigh region pain will give you toradol 60 mg im today.  Continue the flexeril  Attend appointment for epidural injection on April 01, 2017.  Will assess home health form and fill it out. Hopefully the epidural will work as prior one did and will limit need for home health.   Follow up to be determined after Epidural injection. But come in sooner if needed

## 2017-03-24 NOTE — Telephone Encounter (Signed)
Received Physician Order/Request for Independent Assessment for Personal Care Services (PCS) Attestation of Medical Need from [facility], pt has appointment today with PCP at 11:00am; forwarded to provider/SLS

## 2017-03-26 ENCOUNTER — Telehealth: Payer: Self-pay | Admitting: Medical

## 2017-03-26 NOTE — Telephone Encounter (Signed)
You had mentioned pt wanted another toradol injection. As I mentioned that I don't want to keep using toradol. Repeat injections since possible mobic side effect/allergy and long list of nsaids denied by insurance. Also allergies to narcotics.   Our printer is not working. But on Monday remind me and will try to prescribe lyrica. If lyrica not covered then I am going to refer to pain management. She is also going to get epidural somtime in future.  You asked me late on Friday then I got busy and reviewed this after hours. So please explain to pt.

## 2017-03-29 ENCOUNTER — Telehealth: Payer: Self-pay | Admitting: Medical

## 2017-03-29 NOTE — Telephone Encounter (Signed)
Printer still not working. But I want you to try to call in lyrica 25 mg #90 1 tab po tid. See if that is covered. This medication is for nerve pain. Or print the rx and then we can fax it over.

## 2017-03-30 ENCOUNTER — Other Ambulatory Visit (HOSPITAL_COMMUNITY): Payer: Medicare Other

## 2017-03-30 MED ORDER — PREGABALIN 25 MG PO CAPS: 25.0000 mg | ORAL_CAPSULE | Freq: Three times a day (TID) | ORAL | 0 refills | Status: DC

## 2017-03-30 NOTE — Telephone Encounter (Signed)
Rx faxed to pharmacy  

## 2017-03-30 NOTE — Addendum Note (Signed)
Addended by: Orlene OchRENCE, Jamorris Ndiaye N on: 03/30/2017 11:34 AM   Modules accepted: Orders

## 2017-03-31 ENCOUNTER — Encounter: Payer: Self-pay | Admitting: Medical

## 2017-04-01 ENCOUNTER — Ambulatory Visit
Admission: RE | Admit: 2017-04-01 | Discharge: 2017-04-01 | Disposition: A | Discharge status: Home / Self Care | Payer: Medicare Other | Source: Ambulatory Visit | Attending: Family Medicine | Admitting: Family Medicine

## 2017-04-01 DIAGNOSIS — M545 Low back pain: Principal | ICD-10-CM

## 2017-04-01 DIAGNOSIS — G8929 Other chronic pain: Secondary | ICD-10-CM

## 2017-04-01 DIAGNOSIS — M5126 Other intervertebral disc displacement, lumbar region: Secondary | ICD-10-CM | POA: Diagnosis not present

## 2017-04-01 MED ORDER — IOPAMIDOL (ISOVUE-M 200) INJECTION 41%
1.0000 mL | Freq: Once | INTRAMUSCULAR | Status: AC
  Administered 2017-04-01: 1 mL via EPIDURAL

## 2017-04-01 MED ORDER — METHYLPREDNISOLONE ACETATE 40 MG/ML INJ SUSP (RADIOLOG
120.0000 mg | Freq: Once | INTRAMUSCULAR | Status: AC
  Administered 2017-04-01: 120 mg via EPIDURAL

## 2017-04-01 NOTE — Discharge Instructions (Signed)

## 2017-04-07 NOTE — Telephone Encounter (Signed)
Pt states she will need help with landry because she has to bend over and it hurts her. Pt also needs someone to drive her to her appointments. Pt  states that when standing for a long time makes her back hurt.

## 2017-04-09 ENCOUNTER — Other Ambulatory Visit: Payer: Self-pay | Admitting: Medical

## 2017-04-09 ENCOUNTER — Encounter: Payer: Self-pay | Admitting: Family Medicine

## 2017-04-09 MED ORDER — CYCLOBENZAPRINE HCL 10 MG PO TABS: 10.0000 mg | ORAL_TABLET | Freq: Three times a day (TID) | ORAL | 0 refills | Status: DC | PRN

## 2017-04-09 NOTE — Telephone Encounter (Signed)
Request refill of flexeril sent to me. I approvd.

## 2017-04-14 ENCOUNTER — Other Ambulatory Visit: Payer: Self-pay | Admitting: Medical

## 2017-04-19 ENCOUNTER — Other Ambulatory Visit: Payer: Self-pay | Admitting: Family Medicine

## 2017-04-19 DIAGNOSIS — G8929 Other chronic pain: Secondary | ICD-10-CM

## 2017-04-19 DIAGNOSIS — M545 Low back pain: Principal | ICD-10-CM

## 2017-04-28 ENCOUNTER — Inpatient Hospital Stay
Admission: RE | Admit: 2017-04-28 | Discharge: 2017-04-28 | Disposition: A | Discharge status: Home / Self Care | Payer: Medicare Other | Source: Ambulatory Visit | Attending: Family Medicine | Admitting: Family Medicine

## 2017-05-18 ENCOUNTER — Ambulatory Visit
Admission: RE | Admit: 2017-05-18 | Discharge: 2017-05-18 | Disposition: A | Discharge status: Home / Self Care | Payer: Medicare Other | Source: Ambulatory Visit | Attending: Family Medicine | Admitting: Family Medicine

## 2017-05-18 ENCOUNTER — Other Ambulatory Visit: Payer: Self-pay | Admitting: Family Medicine

## 2017-05-18 DIAGNOSIS — G8929 Other chronic pain: Secondary | ICD-10-CM

## 2017-05-18 DIAGNOSIS — M545 Low back pain: Secondary | ICD-10-CM | POA: Diagnosis not present

## 2017-05-18 MED ORDER — METHYLPREDNISOLONE ACETATE 40 MG/ML INJ SUSP (RADIOLOG: 120.0000 mg | Freq: Once | INTRAMUSCULAR | Status: DC

## 2017-05-18 MED ORDER — IOPAMIDOL (ISOVUE-M 200) INJECTION 41%: 1.0000 mL | Freq: Once | INTRAMUSCULAR | Status: DC

## 2017-05-18 NOTE — Discharge Instructions (Signed)

## 2017-06-03 ENCOUNTER — Telehealth: Payer: Self-pay | Admitting: Medical

## 2017-06-03 ENCOUNTER — Other Ambulatory Visit (INDEPENDENT_AMBULATORY_CARE_PROVIDER_SITE_OTHER): Payer: Medicare Other

## 2017-06-03 DIAGNOSIS — R946 Abnormal results of thyroid function studies: Secondary | ICD-10-CM

## 2017-06-03 DIAGNOSIS — R7989 Other specified abnormal findings of blood chemistry: Secondary | ICD-10-CM

## 2017-06-03 LAB — TSH: TSH: 0.5 u[IU]/mL (ref 0.35–4.50)

## 2017-06-03 LAB — T4, FREE: Free T4: 0.71 ng/dL (ref 0.60–1.60)

## 2017-06-03 NOTE — Telephone Encounter (Signed)
Patient needs TB test for employment purposes. Would you schedule her nurse visit for that.

## 2017-06-03 NOTE — Telephone Encounter (Signed)
Pt is scheduled for PPD placement on 10.10.18 @ 2:30pm and for PPD read on 10.12.18 @ 2:30pm/prepared note with this information and put it up front per her request for her job/thx dmf

## 2017-06-03 NOTE — Telephone Encounter (Signed)
Relation to AV:WUJW Call back number: [phone]  Reason for call:  Patient requesting TB orders due to employment,please advise

## 2017-06-13 ENCOUNTER — Encounter (HOSPITAL_BASED_OUTPATIENT_CLINIC_OR_DEPARTMENT_OTHER): Payer: Self-pay | Admitting: Emergency Medicine

## 2017-06-13 ENCOUNTER — Emergency Department (HOSPITAL_BASED_OUTPATIENT_CLINIC_OR_DEPARTMENT_OTHER)
Admission: EM | Admit: 2017-06-13 | Discharge: 2017-06-13 | Disposition: A | Discharge status: Home / Self Care | Payer: Medicare Other | Attending: Emergency Medicine | Admitting: Emergency Medicine

## 2017-06-13 ENCOUNTER — Emergency Department (HOSPITAL_COMMUNITY): Payer: Medicare Other

## 2017-06-13 ENCOUNTER — Emergency Department (HOSPITAL_BASED_OUTPATIENT_CLINIC_OR_DEPARTMENT_OTHER): Payer: Medicare Other

## 2017-06-13 DIAGNOSIS — Z79899 Other long term (current) drug therapy: Secondary | ICD-10-CM | POA: Diagnosis not present

## 2017-06-13 DIAGNOSIS — R200 Anesthesia of skin: Secondary | ICD-10-CM | POA: Insufficient documentation

## 2017-06-13 DIAGNOSIS — F17290 Nicotine dependence, other tobacco product, uncomplicated: Secondary | ICD-10-CM | POA: Insufficient documentation

## 2017-06-13 DIAGNOSIS — I10 Essential (primary) hypertension: Secondary | ICD-10-CM | POA: Insufficient documentation

## 2017-06-13 LAB — CBC
HEMATOCRIT: 38.8 % (ref 36.0–46.0)
HEMOGLOBIN: 13 g/dL (ref 12.0–15.0)
MCH: 26.8 pg (ref 26.0–34.0)
MCHC: 33.5 g/dL (ref 30.0–36.0)
MCV: 80 fL (ref 78.0–100.0)
Platelets: 357 10*3/uL (ref 150–400)
RBC: 4.85 MIL/uL (ref 3.87–5.11)
RDW: 14.8 % (ref 11.5–15.5)
WBC: 10.7 10*3/uL — ABNORMAL HIGH (ref 4.0–10.5)

## 2017-06-13 LAB — COMPREHENSIVE METABOLIC PANEL
ALBUMIN: 4.4 g/dL (ref 3.5–5.0)
ALT: 10 U/L — ABNORMAL LOW (ref 14–54)
ANION GAP: 7 (ref 5–15)
AST: 22 U/L (ref 15–41)
Alkaline Phosphatase: 79 U/L (ref 38–126)
BILIRUBIN TOTAL: 0.6 mg/dL (ref 0.3–1.2)
BUN: 12 mg/dL (ref 6–20)
CO2: 21 mmol/L — AB (ref 22–32)
Calcium: 9.4 mg/dL (ref 8.9–10.3)
Chloride: 108 mmol/L (ref 101–111)
Creatinine, Ser: 0.68 mg/dL (ref 0.44–1.00)
GFR calc Af Amer: >60 mL/min (ref >60)
GFR calc non Af Amer: >60 mL/min (ref >60)
GLUCOSE: 98 mg/dL (ref 65–99)
Potassium: 3.6 mmol/L (ref 3.5–5.1)
Sodium: 136 mmol/L (ref 135–145)
TOTAL PROTEIN: 8.5 g/dL — AB (ref 6.5–8.1)

## 2017-06-13 LAB — APTT: aPTT: 30 seconds (ref 24–36)

## 2017-06-13 LAB — DIFFERENTIAL
BASOS PCT: 0 %
Basophils Absolute: 0 10*3/uL (ref 0.0–0.1)
Eosinophils Absolute: 0.3 10*3/uL (ref 0.0–0.7)
Eosinophils Relative: 3 %
LYMPHS ABS: 4.6 10*3/uL — AB (ref 0.7–4.0)
Lymphocytes Relative: 43 %
MONOS PCT: 7 %
Monocytes Absolute: 0.8 10*3/uL (ref 0.1–1.0)
NEUTROS ABS: 5 10*3/uL (ref 1.7–7.7)
Neutrophils Relative %: 47 %

## 2017-06-13 LAB — PROTIME-INR
INR: 0.98
Prothrombin Time: 12.9 seconds (ref 11.4–15.2)

## 2017-06-13 LAB — TROPONIN I: Troponin I: <0.03 ng/mL (ref <0.03)

## 2017-06-13 LAB — CBG MONITORING, ED: Glucose-Capillary: 108 mg/dL — ABNORMAL HIGH (ref 65–99)

## 2017-06-13 MED ORDER — ACETAMINOPHEN 325 MG PO TABS
650.0000 mg | ORAL_TABLET | Freq: Once | ORAL | Status: AC
  Administered 2017-06-13: 650 mg via ORAL
  Filled 2017-06-13: qty 2

## 2017-06-13 NOTE — Discharge Instructions (Signed)
Follow-up with your primary care doctor regarding the blood pressure. Also follow-up with neurology in [facility] area regarding the numbness. Today's MRI of the brain without any acute findings. No evidence of stroke. Return for any new or worse symptoms.

## 2017-06-13 NOTE — ED Notes (Signed)
Pt on cardiac monitor and auto VS 

## 2017-06-13 NOTE — ED Notes (Signed)
[facility] radiology notified of code stroke status at [phone]

## 2017-06-13 NOTE — ED Provider Notes (Signed)
Patient transferred in from [facility] for MRI to rule out stroke. Patient with acute onset of left-sided numbness to include the face. Symptoms have improved. There also was weakness that has completely resolved. I think in face of a negative MRI which we did have today that patient can be followed up as an outpatient. Patient will follow-up with her neurologist. Patient also noted to have high blood pressure here today. Could be due to her symptoms which include intermittent headache. We'll have her follow-up with her primary care Dr. for recheck of the blood pressure and for trending of the blood pressure.   Vanetta Mulders, MD 06/13/17 1240

## 2017-06-13 NOTE — ED Notes (Signed)
ED Provider at bedside. 

## 2017-06-13 NOTE — ED Notes (Signed)
Patient transported to CT 

## 2017-06-13 NOTE — ED Provider Notes (Addendum)
[facility] Provider Note   CSN: [id number] Arrival date & time: 06/13/17  4098     History   Chief Complaint Chief Complaint  Patient presents with  . Numbness    HPI Phyllis Adams is a 41 y.o. female.  HPI Patient states at approximately 8 AM she started getting a tingling sensation in her left arm that quickly progressed to numbness of the arm and the left side of her face. She reports she felt slightly weak in the left arm. As she was driving to the emergency department she felt that the arm was a little heavier to use. No associated headache. Patient reports that she does have a history of migraine headaches but denies ever having neurologic symptoms of paresthesia or weakness in association. She typically gets them once a month around her menstrual cycle. She reports she had a headache yesterday and has just finished her menstrual cycle. She however did not have any headache today before or when the symptoms started. No palpitations and no chest pain. No lower extremity weakness and numbness. Patient reports that she had a history of hypertension when she was significantly more overweight. She has now predominantly had diet and exercise controlled hypertension. She does note that yesterday her blood pressure was somewhat elevated for her, it was about 150/100. Past Medical History:  Diagnosis Date  . Depression   . Femur fracture (HCC) 2006  . Hypertension   . Migraine   . Mood disorder [facility])     Patient Active Problem List   Diagnosis Date Noted  . Right leg pain 07/16/2016  . Osteoarthritis of spine with radiculopathy, 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  . Segmental and somatic dysfunction of 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  . Lumbar radiculopathy 01/16/2016  . Retained orthopedic hardware 01/16/2016    Past Surgical History:  Procedure Laterality Date  . APPENDECTOMY    . CHOLECYSTECTOMY    . FEMUR FRACTURE SURGERY Right 2006  . FEMUR IM ROD REMOVAL Right 03/11/2016    . TUBAL LIGATION      OB History    No data available       Home Medications    Prior to Admission medications   Medication Sig Start Date End Date Taking? Authorizing Provider  Butalbital-APAP-Caffeine (FIORICET) 50-300-40 MG CAPS 1 tab po q 8 hrs as needed HA 11/05/16   [name], [name], PA-C  cyclobenzaprine (FLEXERIL) 10 MG tablet Take 1 tablet (10 mg total) by mouth 3 (three) times daily as needed for muscle spasms. 04/09/17   [name], [name], PA-C  fluticasone (FLONASE) 50 MCG/ACT nasal spray Place 2 sprays into both nostrils daily. 06/03/16   [name], [name], PA-C  meloxicam (MOBIC) 15 MG tablet TK 1 T PO D 03/12/17   [provider]  ondansetron (ZOFRAN-ODT) 8 MG disintegrating tablet DISSOLVE 1 TABLET(8 MG) ON THE TONGUE EVERY 8 HOURS AS NEEDED FOR NAUSEA OR VOMITING 12/18/16   [name], [name], PA-C  pregabalin (LYRICA) 25 MG capsule Take 1 capsule (25 mg total) by mouth 3 (three) times daily. 03/30/17   [name], [name], PA-C  pregabalin (LYRICA) 25 MG capsule Take 1 capsule (25 mg total) by mouth 3 (three) times daily. 03/30/17   [name], [name], PA-C  SUMAtriptan (IMITREX) 50 MG tablet .1 tab po at onset of ha. Repeat in 2 hours if ha persists(max number of tabs is 2 in any 24 hour period) 11/05/16   [name], [name], PA-C  VENTOLIN HFA 108 (90 Base) MCG/ACT inhaler INHALE 2 PUFFS INTO THE LUNGS EVERY  6 HOURS AS NEEDED FOR WHEEZING OR SHORTNESS OF BREATH 04/16/17   [name], [name], PA-C    Family History Family History  Problem Relation Age of Onset  . Hypertension Mother   . Hypertension Maternal Aunt   . Hypertension Paternal Uncle   . Hypertension Maternal Grandmother   . Hypertension Maternal Grandfather     Social History Social History  Substance Use Topics  . Smoking status: Current Every Day Smoker    Packs/day: 0.00    Types: Cigars  . Smokeless tobacco: Never Used     Comment: 3 cigars with marijuiana  . Alcohol use No     Allergies   Tramadol;  Hydrocodone-acetaminophen; and Acetaminophen-codeine   Review of Systems Review of Systems 10 Systems reviewed and are negative for acute change except as noted in the HPI.   Physical Exam Updated Vital Signs BP (!) 152/102   Pulse 93   Resp 17   LMP 06/08/2017   SpO2 100%   Physical Exam  Constitutional: She is oriented to person, place, and time. She appears well-developed and well-nourished. No distress.  HENT:  Head: Normocephalic and atraumatic.  Nose: Nose normal.  Mouth/Throat: Oropharynx is clear and moist.  Eyes: Pupils are equal, round, and reactive to light. Conjunctivae and EOM are normal.  Neck: Neck supple.  Cardiovascular: Normal rate, regular rhythm, normal heart sounds and intact distal pulses.   No murmur heard. Pulmonary/Chest: Effort normal and breath sounds normal. No respiratory distress.  Abdominal: Soft. She exhibits no distension. There is no tenderness.  Musculoskeletal: Normal range of motion. She exhibits no edema or tenderness.  Neurological: She is alert and oriented to person, place, and time. No cranial nerve deficit. Coordination normal.  Patient is alert and appropriate. Normal cognitive function. Speech is clear. Motor function of cranial nerves normal and symmetric. Subjectively patient endorses decreased sensation to light touch on the left side of the face. Patient can hold both arms extended against resistance. With grip strength patient has strong grip bilaterally but very slightly decreased on the left relative to the right. Patient can elevate each lower extremity off of the bed and hold against resistance. Patient endorses difference to light touch on left upper extremity is a versus right upper extremity.  Skin: Skin is warm and dry.  Psychiatric: She has a normal mood and affect.  Nursing note and vitals reviewed.    ED Treatments / Results  Labs (all labs ordered are listed, but only abnormal results are displayed) Labs Reviewed    CBC - Abnormal; Notable for the following:       Result Value   WBC 10.7 (*)    All other components within normal limits  DIFFERENTIAL - Abnormal; Notable for the following:    Lymphs Abs 4.6 (*)    All other components within normal limits  COMPREHENSIVE METABOLIC PANEL - Abnormal; Notable for the following:    CO2 21 (*)    Total Protein 8.5 (*)    ALT 10 (*)    All other components within normal limits  CBG MONITORING, ED - Abnormal; Notable for the following:    Glucose-Capillary 108 (*)    All other components within normal limits  PROTIME-INR  APTT  TROPONIN I    EKG  EKG Interpretation  Date/Time:  Sunday June 13 2017 08:41:45 EDT Ventricular Rate:  98 PR Interval:    QRS Duration: 80 QT Interval:  380 QTC Calculation: 486 R Axis:   41 Text Interpretation:  Sinus rhythm normal. no old comparison Confirmed by Arby Barrette [phone]) on 06/13/2017 9:09:27 AM       Radiology Ct Head Code Stroke Wo Contrast  Result Date: 06/13/2017 CLINICAL DATA:  Code stroke. Acute left-sided numbness of the face and extremities, 20 minutes duration. EXAM: CT HEAD WITHOUT CONTRAST TECHNIQUE: Contiguous axial images were obtained from the base of the skull through the vertex without intravenous contrast. COMPARISON:  MRI 12/19/2016 FINDINGS: Brain: Normal. No evidence of atrophy, old or acute infarction, mass lesion, hemorrhage, hydrocephalus or extra-axial collection. Vascular: No abnormal vascular finding. Skull: Normal Sinuses/Orbits: Normal Other: None ASPECTS (Alberta Stroke Program Early CT Score) - Ganglionic level infarction (caudate, lentiform nuclei, internal capsule, insula, M1-M3 cortex): 7 - Supraganglionic infarction (M4-M6 cortex): 3 Total score (0-10 with 10 being normal): 10 IMPRESSION: 1. Normal head CT 2. ASPECTS is 10. These results were called by telephone at the time of interpretation on 06/13/2017 at 8:44 am to Dr. Arby Barrette , who verbally acknowledged these  results. Electronically Signed   By: Paulina Fusi M.D.   On: 06/13/2017 08:46    Procedures Procedures (including critical care time)  Medications Ordered in ED Medications - No data to display   Initial Impression / Assessment and Plan / ED Course  I have reviewed the triage vital signs and the nursing notes.  Pertinent labs & imaging results that were available during my care of the patient were reviewed by me and considered in my medical decision making (see chart for details).     Consultation: Reviewed with Dr. Jerrell Belfast of neurology. At this time as patient's symptoms are significantly subjective, will not have patient continue his code stroke but plan for transfer to [facility] for MRI and subsequent consult to Dr. Jerrell Belfast if anomaly identified or other concerning symptoms develop. Consult: Dr. Jodelle Gross emergency department for transfer.  Final Clinical Impressions(s) / ED Diagnoses   Final diagnoses:  Left sided numbness  Patient presents acute onset of left facial and left upper extremity numbness and equivocal left grip weakness at 8 AM. Patient does have history of migraines but denies headache today. She does identify a significant recent stress as well. Patient is alert and appropriate. Exam is predominantly for subjective sensory decrease. Plan will be to continue evaluation by MRI as per discussion with Dr. Jerrell Belfast.  New Prescriptions New Prescriptions   No medications on file     Arby Barrette, MD 06/13/17 6045    Arby Barrette, MD 06/13/17 [phone]

## 2017-06-13 NOTE — ED Notes (Signed)
Pt returns MRI.

## 2017-06-13 NOTE — ED Triage Notes (Signed)
Pt reports L arm and facial numbness x 20 min.

## 2017-06-13 NOTE — ED Notes (Signed)
Assisted pt too restroom

## 2017-06-16 ENCOUNTER — Encounter: Payer: Self-pay | Admitting: Medical

## 2017-06-16 ENCOUNTER — Ambulatory Visit: Payer: Medicare Other

## 2017-06-16 ENCOUNTER — Telehealth: Payer: Self-pay | Admitting: Medical

## 2017-06-16 ENCOUNTER — Ambulatory Visit (INDEPENDENT_AMBULATORY_CARE_PROVIDER_SITE_OTHER): Payer: Medicare Other | Admitting: Medical

## 2017-06-16 VITALS — BP 155/95 | HR 72 | Temp 98.1°F | Resp 18 | Ht <= 58 in | Wt 152.0 lb

## 2017-06-16 DIAGNOSIS — I10 Essential (primary) hypertension: Secondary | ICD-10-CM

## 2017-06-16 DIAGNOSIS — M542 Cervicalgia: Secondary | ICD-10-CM | POA: Diagnosis not present

## 2017-06-16 LAB — LIPID PANEL
CHOL/HDL RATIO: 3
Cholesterol: 106 mg/dL (ref 0–200)
HDL: 40.7 mg/dL (ref >39.00)
LDL CALC: 55 mg/dL (ref 0–99)
NONHDL: 64.87
Triglycerides: 48 mg/dL (ref 0.0–149.0)
VLDL: 9.6 mg/dL (ref 0.0–40.0)

## 2017-06-16 MED ORDER — AMLODIPINE BESYLATE 10 MG PO TABS: 10.0000 mg | ORAL_TABLET | Freq: Every day | ORAL | 0 refills | Status: DC

## 2017-06-16 NOTE — Patient Instructions (Addendum)
For your hypertension, I prescribed medication amlodipine. I want you to take the first tablet today when you get the prescription filled then start tomorrow morning regularly. I am prescribing electronic blood pressure cuff. Hopefully your insurance would cover that. If not do think they cost $20-$30 over-the-counter.  If you do have recurrent neurologic signs or symptoms as the  other day in ED then advise   to be rechecked there again.  For intermittent left trapezius discomfort and tightness can use your Flexeril. But recommend the use at night. Presently avoid any NSAIDs while we are trying to bring down your blood pressure.  After discussion you declined referral to neurologist presently. If your blood pressure doesn't come down or if you have recurrent neurologic symptoms then would refer you.  Your history of lower back pain and thigh region pain is much improved. 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 I would just be cautious not do any thing repetitively. I think it would be safe to pick up the children as needed. If you have to move furniture or equipment make sure that you have help. I filled out your form today.   Follow-up in 10 days to 2 weeks for blood pressure check.  Please get lipid panel today.

## 2017-06-16 NOTE — Telephone Encounter (Signed)
Opened to review 

## 2017-06-16 NOTE — Progress Notes (Signed)
Subjective:    Patient ID: Phyllis Adams, female    DOB: June 29, 1976, 40 y.o.   MRN: [id number]  HPI  Pt in states she has some mild ha and some trapezius pain late last week. She thought was tension HA. Pt states late last week when got ppd. Her bp was 150/100 on Saturday. She went home and relaxed. Then on Sunday morning left hand felt tingling and slight weak. Her face was little tingly. So she went to ED downstairs and they did CT.But then snet  to ED at [facility] for mri which was eventually negative.  ED and hospital did not give any medications.   Pt bp came down a little at [facility] ED before they let her go but was still high. Years ago she took lisinopril had dry cough with. Also she has heard about angioedema and wants to avoid this potential side effect.  LMP- on Monday ended.  Pt left trapezius a times feel tight. She states lateral neck discomfort at times. Some mild degeneration of c6-c7 on xray in June 2018.  She wonders if some of her left arm symptoms could be related to her neck. On discussion she does not report mid cervical spine region pain but at times does report some pain to the left of the spine.   Review of Systems  Constitutional: Negative for chills, diaphoresis, fatigue and fever.  HENT: Negative for congestion, drooling, ear pain, mouth sores, postnasal drip, sinus pain and sinus pressure.   Respiratory: Negative for cough, chest tightness, shortness of breath and wheezing.   Cardiovascular: Negative for chest pain and palpitations.  Gastrointestinal: Negative for abdominal pain, blood in stool, constipation, diarrhea and vomiting.  Musculoskeletal: Negative for back pain, joint swelling, myalgias and neck stiffness.  Skin: Negative for rash.  Neurological: Negative for dizziness, seizures, speech difficulty, weakness, light-headedness and numbness.       Not presently.  Hematological: Negative for adenopathy. Does not bruise/bleed easily.  Psychiatric/Behavioral:  Negative for behavioral problems, decreased concentration and hallucinations. The patient is not nervous/anxious and is not hyperactive.        Objective:   Physical Exam  General Mental Status- Alert. General Appearance- Not in acute distress.   Skin General: Color- Normal Color. Moisture- Normal Moisture.  Neck Carotid Arteries- Normal color. Moisture- Normal Moisture. No carotid bruits. No JVD.  left trapezius muscle minimally tender to palpation. No mid cervical spine tenderness presently.  Chest and Lung Exam Auscultation: Breath Sounds:-Normal.  Cardiovascular Auscultation:Rythm- Regular. Murmurs & Other Heart Sounds:Auscultation of the heart reveals- No Murmurs.  Abdomen Inspection:-Inspeection Normal. Palpation/Percussion:Note:No mass. Palpation and Percussion of the abdomen reveal- Non Tender, Non Distended + BS, no rebound or guarding.    Neurologic Cranial Nerve exam:- CN III-XII intact(No nystagmus), symmetric smile. Drift Test:- No drift. Romberg Exam:- Negative.  Heal to Toe Gait exam:-Normal. Finger to Nose:- Normal/Intact Strength:- 5/5 equal and symmetric strength both upper and lower extremities.  sharp and dull discrimination intact on left hand.     Assessment & Plan:  For your hypertension, I prescribed medication amlodipine. I want you to take the first tablet today when you get the prescription filled then start tomorrow morning regularly. I am prescribing electronic blood pressure cuff. Hopefully your insurance would cover that. If not do think they cost $20-$30 over-the-counter.  If you do have recurrent neurologic signs or symptoms as the  other day in ED then advise   to be rechecked there again.  For intermittent left trapezius  discomfort and tightness can use your Flexeril. But recommend the use at night. Presently avoid any NSAIDs while we are trying to bring down your blood pressure.  After discussion you declined referral to neurologist  presently. If your blood pressure doesn't come down or if you have recurrent neurologic symptoms then would refer you.  Your history of lower back pain and thigh region pain is much improved. 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 I would just be cautious not do any thing repetitively. I think it would be safe to pick up the children as needed. If you have to move furniture or equipment make sure that you have help. I filled out your form today.   Follow-up in 10 days to 2 weeks for blood pressure check.  Please get lipid panel today.  Shoshana Johal, [name], PA-C

## 2017-06-17 ENCOUNTER — Other Ambulatory Visit: Payer: Self-pay | Admitting: Medical

## 2017-06-18 ENCOUNTER — Ambulatory Visit: Payer: Medicare Other

## 2017-07-02 ENCOUNTER — Telehealth: Payer: Self-pay | Admitting: Medical

## 2017-07-02 ENCOUNTER — Ambulatory Visit: Payer: Medicare Other | Admitting: Medical

## 2017-07-02 NOTE — Telephone Encounter (Signed)
Pt called in at 12:20 to make provider aware that she will not be at her 1:00 apt today. She said that she is stuck at work and dont have anyone to relieve her.   Pt is going to call back to reschedule her apt.

## 2017-07-08 ENCOUNTER — Other Ambulatory Visit: Payer: Self-pay | Admitting: Medical

## 2017-07-08 DIAGNOSIS — E860 Dehydration: Secondary | ICD-10-CM | POA: Diagnosis not present

## 2017-07-08 DIAGNOSIS — B349 Viral infection, unspecified: Secondary | ICD-10-CM | POA: Diagnosis not present

## 2017-07-08 DIAGNOSIS — R42 Dizziness and giddiness: Secondary | ICD-10-CM | POA: Diagnosis not present

## 2017-07-13 ENCOUNTER — Other Ambulatory Visit: Payer: Self-pay | Admitting: Medical

## 2017-07-14 MED ORDER — AMLODIPINE BESYLATE 10 MG PO TABS: 10.0000 mg | ORAL_TABLET | Freq: Every day | ORAL | 0 refills | Status: DC

## 2017-08-05 ENCOUNTER — Other Ambulatory Visit: Payer: Self-pay | Admitting: Medical

## 2017-08-07 ENCOUNTER — Other Ambulatory Visit: Payer: Self-pay | Admitting: Medical

## 2017-08-11 ENCOUNTER — Encounter: Payer: Self-pay | Admitting: Medical

## 2017-08-27 ENCOUNTER — Encounter: Payer: Self-pay | Admitting: Medical

## 2017-08-27 ENCOUNTER — Ambulatory Visit (INDEPENDENT_AMBULATORY_CARE_PROVIDER_SITE_OTHER): Payer: Medicare Other | Admitting: Medical

## 2017-08-27 ENCOUNTER — Other Ambulatory Visit: Payer: Self-pay | Admitting: Medical

## 2017-08-27 VITALS — BP 125/79 | HR 85 | Temp 98.2°F | Resp 16 | Ht <= 58 in | Wt 157.0 lb

## 2017-08-27 DIAGNOSIS — M5441 Lumbago with sciatica, right side: Secondary | ICD-10-CM | POA: Diagnosis not present

## 2017-08-27 DIAGNOSIS — Z1239 Encounter for other screening for malignant neoplasm of breast: Secondary | ICD-10-CM

## 2017-08-27 DIAGNOSIS — Z1231 Encounter for screening mammogram for malignant neoplasm of breast: Secondary | ICD-10-CM

## 2017-08-27 DIAGNOSIS — Z124 Encounter for screening for malignant neoplasm of cervix: Secondary | ICD-10-CM | POA: Diagnosis not present

## 2017-08-27 DIAGNOSIS — M542 Cervicalgia: Secondary | ICD-10-CM | POA: Diagnosis not present

## 2017-08-27 DIAGNOSIS — I10 Essential (primary) hypertension: Secondary | ICD-10-CM

## 2017-08-27 MED ORDER — CYCLOBENZAPRINE HCL 10 MG PO TABS: 10.0000 mg | ORAL_TABLET | Freq: Three times a day (TID) | ORAL | 1 refills | Status: DC | PRN

## 2017-08-27 MED ORDER — PREGABALIN 25 MG PO CAPS: 25.0000 mg | ORAL_CAPSULE | Freq: Three times a day (TID) | ORAL | 0 refills | Status: DC

## 2017-08-27 MED ORDER — MELOXICAM 15 MG PO TABS: 15.0000 mg | ORAL_TABLET | Freq: Every day | ORAL | 1 refills | Status: DC

## 2017-08-27 MED ORDER — CYCLOBENZAPRINE HCL 10 MG PO TABS: 10.0000 mg | ORAL_TABLET | Freq: Three times a day (TID) | ORAL | 0 refills | Status: DC | PRN

## 2017-08-27 NOTE — Patient Instructions (Signed)
Your blood pressure is much better now on amlodipine.   For your chronic back pain, leg pain and left trapezius pain, I refilled the meloxicam and muscle relaxant.  I placed referral to group her orthopedist so they can evaluate you for possible repeat epidural.  After discussion to go ahead and refer you to GYN in our building for Pap smear/woman's exam.  Also went ahead and placed order for screening mammogram.  Could call radiology and see if they can do it on the same day of your GYN visit.  Follow-up in 3-6 months or as needed.

## 2017-08-27 NOTE — Progress Notes (Signed)
Subjective:    Patient ID: Phyllis Adams, female    DOB: 11/01/1975, 41 y.o.   MRN: [id number]  HPI   Pt in for recent flare up of her low back and rt lower leg. This is a chronic pain for which she has been treated by myself and by specialist. Pt was getting epidural in summer. About 4 months since last injection.  She states did good for a while but pain started to return last month. Since injections were working she was not on mobic, lyrica or muscle relaxants.  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  Pt had hx of rt thigh pain with history of rod in her thigh for years. She had to have surgery to remove the rod.  Regarding back pain CT of back showed.   Also reports some left side of neck pain with occasional pain shoots down her left arm at times. In past she states muscle relaxers helped.    1. No acute abnormality within the lumbar spine. 2. 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 No significant stenosis identified on this limited noncontrast CT. Further evaluation with dedicated MRI would likely be helpful for complete evaluation. 3. 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   BP is good level today. She is on amlodipine.   Review of Systems  Constitutional: Negative for chills, fatigue and fever.  Respiratory: Negative for cough, choking, shortness of breath and wheezing.   Cardiovascular: Negative for chest pain and palpitations.  Gastrointestinal: Negative for abdominal pain.  Genitourinary: Negative for flank pain, frequency, pelvic pain and urgency.  Musculoskeletal: Positive for back pain and neck pain. Negative for arthralgias, myalgias and neck stiffness.       Low back, left leg and left trapezius pain.  Skin: Negative for rash.  Neurological: Negative for dizziness, facial asymmetry, weakness and headaches.  Hematological: Negative for adenopathy.  Does not bruise/bleed easily.  Psychiatric/Behavioral: Negative for behavioral problems, self-injury, sleep disturbance and suicidal ideas. The patient is not nervous/anxious.    Past Medical History:  Diagnosis Date  . Depression   . Femur fracture (HCC) 2006  . Hypertension   . Migraine   . Mood disorder [facility](HCC)      Social History   Socioeconomic History  . Marital status: Legally Separated    Spouse name: Not on file  . Number of children: Not on file  . Years of education: Not on file  . Highest education level: Not on file  Social Needs  . Financial resource strain: Not on file  . Food insecurity - worry: Not on file  . Food insecurity - inability: Not on file  . Transportation needs - medical: Not on file  . Transportation needs - non-medical: Not on file  Occupational History  . Not on file  Tobacco Use  . Smoking status: Current Every Day Smoker    Packs/day: 0.00    Types: Cigars  . Smokeless tobacco: Never Used  . Tobacco comment: 3 cigars with marijuiana  Substance and Sexual Activity  . Alcohol use: No  . Drug use: Yes    Types: Marijuana  . Sexual activity: Yes    Birth control/protection: Other-see comments, Surgical  Other Topics Concern  . Not on file  Social History Narrative  . Not on file    Past Surgical History:  Procedure Laterality Date  . APPENDECTOMY    . CHOLECYSTECTOMY    .  FEMUR FRACTURE SURGERY Right 2006  . FEMUR IM ROD REMOVAL Right 03/11/2016  . TUBAL LIGATION      Family History  Problem Relation Age of Onset  . Hypertension Mother   . Hypertension Maternal Aunt   . Hypertension Paternal Uncle   . Hypertension Maternal Grandmother   . Hypertension Maternal Grandfather     Allergies  Allergen Reactions  . Tramadol Shortness Of Breath and Nausea And Vomiting  . Hydrocodone-Acetaminophen Other (See Comments)    Sweats, heavy breathing  . Acetaminophen-Codeine Nausea Only    Current Outpatient Medications on File  Prior to Visit  Medication Sig Dispense Refill  . acetaminophen (TYLENOL) 500 MG tablet Take 1,000 mg by mouth every 6 (six) hours as needed for mild pain or headache.    [name]. amLODipine (NORVASC) 10 MG tablet TAKE 1 TABLET(10 MG) BY MOUTH DAILY 90 tablet 0  . Butalbital-APAP-Caffeine (FIORICET) 50-300-40 MG CAPS 1 tab po q 8 hrs as needed HA (Patient taking differently: Take 1 tablet by mouth every 8 (eight) hours as needed (for headache). 1 tab po q 8 hrs as needed HA) 30 capsule 0  . fluticasone (FLONASE) 50 MCG/ACT nasal spray Place 2 sprays into both nostrils daily. (Patient taking differently: Place 2 sprays into both nostrils daily as needed. ) 16 g 1  . ondansetron (ZOFRAN-ODT) 8 MG disintegrating tablet DISSOLVE 1 TABLET(8 MG) ON THE TONGUE EVERY 8 HOURS AS NEEDED FOR NAUSEA OR VOMITING 15 tablet 0  . pregabalin (LYRICA) 25 MG capsule Take 1 capsule (25 mg total) by mouth 3 (three) times daily. 90 capsule 0  . SUMAtriptan (IMITREX) 50 MG tablet .1 tab po at onset of ha. Repeat in 2 hours if ha persists(max number of tabs is 2 in any 24 hour period) 10 tablet 0  . SUMAtriptan (IMITREX) 50 MG tablet TAKE 1 TABLET BY MOUTH AT ONSET OF HEADACHE. MAY REPEAT 2 HOURS LATER IF HEADACHE PERSISTS. DO NOT TAKE MORE THAN 2 PER DAY 10 tablet 0  . VENTOLIN HFA 108 (90 Base) MCG/ACT inhaler INHALE 2 PUFFS INTO THE LUNGS EVERY 6 HOURS AS NEEDED FOR WHEEZING OR SHORTNESS OF BREATH 18 g 0   No current facility-administered medications on file prior to visit.     BP 125/79   Pulse 85   Temp 98.2 F (36.8 C) (Oral)   Resp 16   Ht 4\' 10"  (1.473 m)   Wt 157 lb (71.2 kg)   SpO2 100%   BMI 32.81 kg/m       Objective:   Physical Exam  General Appearance- Not in acute distress.  neck- no mid spine pain but left trapezius tenderness to palpation.  Chest and Lung Exam Auscultation: Breath sounds:-Normal. Clear even and unlabored. Adventitious sounds:- No Adventitious  sounds.  Cardiovascular Auscultation:Rythm - Regular, rate and rythm. Heart Sounds -Normal heart sounds.  Abdomen Inspection:-Inspection Normal.  Palpation/Perucssion: Palpation and Percussion of the abdomen reveal- Non Tender, No Rebound tenderness, No rigidity(Guarding) and No Palpable abdominal masses.  Liver:-Normal.  Spleen:- Normal.   Back Mid lumbar spine tenderness to palpation. Mild pain on straight leg lift(rt side). Pain on lateral movements and flexion/extension of the spine.  Lower ext neurologic  L5-S1 sensation intact bilaterally. Normal patellar reflexes bilaterally. No foot drop bilaterally.  Rt hip- pain on rom and pain reported in rt hip area     Assessment & Plan:  Your blood pressure is much better now on amlodipine.   For your chronic back pain,  leg pain and left trapezius pain, I refilled the meloxicam and muscle relaxant.  I placed referral to group her orthopedist so they can evaluate you for possible repeat epidural.  After discussion to go ahead and refer you to GYN in our building for Pap smear/woman's exam.  Also went ahead and placed order for screening mammogram.  Could call radiology and see if they can do it on the same day of your GYN visit.  Follow-up in 3-6 months or as needed.  Ambera Fedele, [name], PA-C

## 2017-09-03 ENCOUNTER — Ambulatory Visit (HOSPITAL_BASED_OUTPATIENT_CLINIC_OR_DEPARTMENT_OTHER): Payer: Medicare Other

## 2017-09-08 ENCOUNTER — Other Ambulatory Visit: Payer: Self-pay | Admitting: Medical

## 2017-09-08 DIAGNOSIS — M5441 Lumbago with sciatica, right side: Secondary | ICD-10-CM

## 2017-09-20 ENCOUNTER — Ambulatory Visit
Admission: RE | Admit: 2017-09-20 | Discharge: 2017-09-20 | Disposition: A | Discharge status: Home / Self Care | Payer: Medicare Other | Source: Ambulatory Visit | Attending: Medical | Admitting: Medical

## 2017-09-20 DIAGNOSIS — M545 Low back pain: Secondary | ICD-10-CM | POA: Diagnosis not present

## 2017-09-20 DIAGNOSIS — M5441 Lumbago with sciatica, right side: Secondary | ICD-10-CM

## 2017-09-20 MED ORDER — IOPAMIDOL (ISOVUE-M 200) INJECTION 41%
1.0000 mL | Freq: Once | INTRAMUSCULAR | Status: AC
  Administered 2017-09-20: 1 mL via EPIDURAL

## 2017-09-20 MED ORDER — METHYLPREDNISOLONE ACETATE 40 MG/ML INJ SUSP (RADIOLOG
120.0000 mg | Freq: Once | INTRAMUSCULAR | Status: AC
  Administered 2017-09-20: 120 mg via EPIDURAL

## 2017-09-24 ENCOUNTER — Encounter: Payer: Self-pay | Admitting: Medical

## 2017-09-28 ENCOUNTER — Telehealth: Payer: Self-pay | Admitting: Medical

## 2017-09-28 NOTE — Addendum Note (Signed)
Addended by: Regis BillSCATES, SHARON L on: 09/28/2017 01:37 PM   Modules accepted: Kipp BroodSmartSet

## 2017-09-28 NOTE — Telephone Encounter (Signed)
Patient dropped off paper work and wants it to be fax over, place in front office tray.

## 2017-09-29 ENCOUNTER — Ambulatory Visit (INDEPENDENT_AMBULATORY_CARE_PROVIDER_SITE_OTHER): Payer: Medicare Other

## 2017-09-29 DIAGNOSIS — Z111 Encounter for screening for respiratory tuberculosis: Secondary | ICD-10-CM

## 2017-09-29 NOTE — Progress Notes (Addendum)
Pre visit review using our clinic tool,if applicable. No additional management support is needed unless otherwise documented below in the visit note.   Patient brought forms in early to be signed. Given PPD today. Patient to return on Friday for PPD check. Form given to E. [name] (placed in seat) to complete his section.

## 2017-10-01 ENCOUNTER — Ambulatory Visit: Payer: Medicare Other

## 2017-10-01 DIAGNOSIS — Z111 Encounter for screening for respiratory tuberculosis: Secondary | ICD-10-CM

## 2017-10-01 LAB — TB SKIN TEST
INDURATION: 0 mm
TB SKIN TEST: NEGATIVE

## 2017-10-20 DIAGNOSIS — M791 Myalgia, unspecified site: Secondary | ICD-10-CM | POA: Diagnosis not present

## 2017-10-20 DIAGNOSIS — R51 Headache: Secondary | ICD-10-CM | POA: Diagnosis not present

## 2017-10-20 DIAGNOSIS — J111 Influenza due to unidentified influenza virus with other respiratory manifestations: Secondary | ICD-10-CM | POA: Diagnosis not present

## 2017-10-20 DIAGNOSIS — R112 Nausea with vomiting, unspecified: Secondary | ICD-10-CM | POA: Diagnosis not present

## 2017-10-20 DIAGNOSIS — R05 Cough: Secondary | ICD-10-CM | POA: Diagnosis not present

## 2017-10-20 DIAGNOSIS — R079 Chest pain, unspecified: Secondary | ICD-10-CM | POA: Diagnosis not present

## 2017-10-20 DIAGNOSIS — H9209 Otalgia, unspecified ear: Secondary | ICD-10-CM | POA: Diagnosis not present

## 2017-10-26 ENCOUNTER — Other Ambulatory Visit: Payer: Self-pay | Admitting: Medical

## 2017-11-10 ENCOUNTER — Other Ambulatory Visit: Payer: Self-pay | Admitting: Medical

## 2017-11-19 ENCOUNTER — Other Ambulatory Visit: Payer: Self-pay

## 2017-11-19 MED ORDER — CYCLOBENZAPRINE HCL 10 MG PO TABS: 10.0000 mg | ORAL_TABLET | Freq: Three times a day (TID) | ORAL | 0 refills | Status: DC | PRN

## 2017-11-30 ENCOUNTER — Ambulatory Visit: Payer: Medicare Other | Admitting: Medical

## 2017-11-30 DIAGNOSIS — Z0289 Encounter for other administrative examinations: Secondary | ICD-10-CM

## 2017-12-07 ENCOUNTER — Encounter: Payer: Self-pay | Admitting: Medical

## 2017-12-14 ENCOUNTER — Encounter: Payer: Self-pay | Admitting: Medical

## 2017-12-14 ENCOUNTER — Ambulatory Visit (INDEPENDENT_AMBULATORY_CARE_PROVIDER_SITE_OTHER): Payer: Medicare Other | Admitting: Medical

## 2017-12-14 ENCOUNTER — Telehealth: Payer: Self-pay | Admitting: Medical

## 2017-12-14 VITALS — BP 125/81 | HR 78 | Temp 98.8°F | Resp 16 | Ht <= 58 in | Wt 150.8 lb

## 2017-12-14 DIAGNOSIS — M5416 Radiculopathy, lumbar region: Secondary | ICD-10-CM

## 2017-12-14 DIAGNOSIS — R350 Frequency of micturition: Secondary | ICD-10-CM | POA: Diagnosis not present

## 2017-12-14 DIAGNOSIS — M5441 Lumbago with sciatica, right side: Secondary | ICD-10-CM

## 2017-12-14 DIAGNOSIS — M5410 Radiculopathy, site unspecified: Secondary | ICD-10-CM

## 2017-12-14 LAB — POC URINALSYSI DIPSTICK (AUTOMATED)
Bilirubin, UA: NEGATIVE
Glucose, UA: NEGATIVE
Ketones, UA: NEGATIVE
LEUKOCYTES UA: NEGATIVE
NITRITE UA: NEGATIVE
PH UA: 6.5 (ref 5.0–8.0)
PROTEIN UA: NEGATIVE
Spec Grav, UA: 1.015 (ref 1.010–1.025)
Urobilinogen, UA: NEGATIVE E.U./dL — AB

## 2017-12-14 MED ORDER — FLUCONAZOLE 150 MG PO TABS: 150.0000 mg | ORAL_TABLET | Freq: Once | ORAL | 0 refills | Status: AC

## 2017-12-14 MED ORDER — CIPROFLOXACIN HCL 250 MG PO TABS: 250.0000 mg | ORAL_TABLET | Freq: Two times a day (BID) | ORAL | 0 refills | Status: DC

## 2017-12-14 NOTE — Patient Instructions (Addendum)
You  appear to have a urinary tract infection by symptom report. I am prescribing cipro antibiotic for the possible  infection. Hydrate well. I am sending out a urine culture. During the interim if your signs and symptoms worsen rather than improving please notify us. We will notify your when the culture results are back.  If you get yeast infection while on antibiotic then start diflucan.   If symptoms persist or change then would consider urine ancillary studies. If just frequent urination consider checking sugar level but on urine study today no sugar seen.   Follow up in 7 days or as needed.  If you give me name of the group/specialist that did former epidural then I can put that order in and ask them to coordinate epidural with [facility].

## 2017-12-14 NOTE — Progress Notes (Signed)
Subjective:    Patient ID: Phyllis Adams, female    DOB: 06/23/1976, 42 y.o.   MRN: [id number]  HPI  Pt in with some frequent urination with cloudy appearance to urine. Faint mild odor to urination.  Symptom present last 2 days.  No back pain. No pain on urination.  No severe urge to urinate but then states has to urinate a lot.  No fever, no chills or sweats.   Pt states about 5 years since her last uti.  Pt states in past if drank more sodas had more frequent uti. LMP- on menses presently.   Review of Systems  Constitutional: Negative for chills, fatigue and fever.  Respiratory: Negative for cough, chest tightness, shortness of breath and wheezing.   Cardiovascular: Negative for chest pain and palpitations.  Gastrointestinal: Negative for abdominal distention, abdominal pain, diarrhea, nausea and vomiting.  Musculoskeletal: Positive for back pain. Negative for arthralgias, gait problem, myalgias and neck pain.       Pt recent back and hip area pain. She wants to be set up again for epidural.  Skin: Negative for pallor and rash.  Neurological: Negative for dizziness, numbness and headaches.  Hematological: Negative for adenopathy. Does not bruise/bleed easily.  Psychiatric/Behavioral: Negative for behavioral problems and confusion.    Past Medical History:  Diagnosis Date  . Depression   . Femur fracture (HCC) 2006  . Hypertension   . Migraine   . Mood disorder [facility](HCC)      Social History   Socioeconomic History  . Marital status: Divorced    Spouse name: Not on file  . Number of children: Not on file  . Years of education: Not on file  . Highest education level: Not on file  Occupational History  . Not on file  Social Needs  . Financial resource strain: Not on file  . Food insecurity:    Worry: Not on file    Inability: Not on file  . Transportation needs:    Medical: Not on file    Non-medical: Not on file  Tobacco Use  . Smoking status: Current Every  Day Smoker    Packs/day: 0.00    Types: Cigars  . Smokeless tobacco: Never Used  . Tobacco comment: 3 cigars with marijuiana  Substance and Sexual Activity  . Alcohol use: No  . Drug use: Yes    Types: Marijuana  . Sexual activity: Yes    Birth control/protection: Other-see comments, Surgical  Lifestyle  . Physical activity:    Days per week: Not on file    Minutes per session: Not on file  . Stress: Not on file  Relationships  . Social connections:    Talks on phone: Not on file    Gets together: Not on file    Attends religious service: Not on file    Active member of club or organization: Not on file    Attends meetings of clubs or organizations: Not on file    Relationship status: Not on file  . Intimate partner violence:    Fear of current or ex partner: Not on file    Emotionally abused: Not on file    Physically abused: Not on file    Forced sexual activity: Not on file  Other Topics Concern  . Not on file  Social History Narrative  . Not on file    Past Surgical History:  Procedure Laterality Date  . APPENDECTOMY    . CHOLECYSTECTOMY    . FEMUR FRACTURE  SURGERY Right 2006  . FEMUR IM ROD REMOVAL Right 03/11/2016  . TUBAL LIGATION      Family History  Problem Relation Age of Onset  . Hypertension Mother   . Hypertension Maternal Aunt   . Hypertension Paternal Uncle   . Hypertension Maternal Grandmother   . Hypertension Maternal Grandfather     Allergies  Allergen Reactions  . Tramadol Shortness Of Breath and Nausea And Vomiting  . Hydrocodone-Acetaminophen Other (See Comments)    Sweats, heavy breathing  . Acetaminophen-Codeine Nausea Only    Current Outpatient Medications on File Prior to Visit  Medication Sig Dispense Refill  . acetaminophen (TYLENOL) 500 MG tablet Take 1,000 mg by mouth every 6 (six) hours as needed for mild pain or headache.    [name] amLODipine (NORVASC) 10 MG tablet TAKE 1 TABLET(10 MG) BY MOUTH DAILY 90 tablet 0  .  Butalbital-APAP-Caffeine (FIORICET) 50-300-40 MG CAPS 1 tab po q 8 hrs as needed HA (Patient taking differently: Take 1 tablet by mouth every 8 (eight) hours as needed (for headache). 1 tab po q 8 hrs as needed HA) 30 capsule 0  . cyclobenzaprine (FLEXERIL) 10 MG tablet TAKE 1 TABLET BY MOUTH THREE TIMES DAILY AS NEEDED MUSCLE SPASMS 30 tablet 0  . cyclobenzaprine (FLEXERIL) 10 MG tablet Take 1 tablet (10 mg total) by mouth 3 (three) times daily as needed for muscle spasms. 30 tablet 0  . fluticasone (FLONASE) 50 MCG/ACT nasal spray Place 2 sprays into both nostrils daily. (Patient taking differently: Place 2 sprays into both nostrils daily as needed. ) 16 g 1  . meloxicam (MOBIC) 15 MG tablet Take 1 tablet (15 mg total) by mouth daily. 30 tablet 1  . ondansetron (ZOFRAN-ODT) 8 MG disintegrating tablet DISSOLVE 1 TABLET(8 MG) ON THE TONGUE EVERY 8 HOURS AS NEEDED FOR NAUSEA OR VOMITING 15 tablet 0  . pregabalin (LYRICA) 25 MG capsule Take 1 capsule (25 mg total) by mouth 3 (three) times daily. 90 capsule 0  . pregabalin (LYRICA) 25 MG capsule Take 1 capsule (25 mg total) by mouth 3 (three) times daily. 90 capsule 0  . SUMAtriptan (IMITREX) 50 MG tablet .1 tab po at onset of ha. Repeat in 2 hours if ha persists(max number of tabs is 2 in any 24 hour period) 10 tablet 0  . SUMAtriptan (IMITREX) 50 MG tablet TAKE 1 TABLET BY MOUTH AT ONSET OF HEADACHE. MAY REPEAT 2 HOURS LATER IF HEADACHE PERSISTS. DO NOT TAKE MORE THAN 2 PER DAY 10 tablet 0  . VENTOLIN HFA 108 (90 Base) MCG/ACT inhaler INHALE 2 PUFFS INTO THE LUNGS EVERY 6 HOURS AS NEEDED FOR WHEEZING OR SHORTNESS OF BREATH 18 g 0   No current facility-administered medications on file prior to visit.     BP 125/81   Pulse 78   Temp 98.8 F (37.1 C) (Oral)   Resp 16   Ht 4\' 10"  (1.473 m)   Wt 150 lb 12.8 oz (68.4 kg)   SpO2 100%   BMI 31.52 kg/m       Objective:   Physical Exam  General Mental Status- Alert. General Appearance- Not in  acute distress.   Skin General: Color- Normal Color. Moisture- Normal Moisture.  Neck Carotid Arteries- Normal color. Moisture- Normal Moisture. No carotid bruits. No JVD.  Chest and Lung Exam Auscultation: Breath Sounds:-Normal.  Cardiovascular Auscultation:Rythm- Regular. Murmurs & Other Heart Sounds:Auscultation of the heart reveals- No Murmurs.  Abdomen Inspection:-Inspeection Normal. Palpation/Percussion:Note:No mass. Palpation and Percussion of  the abdomen reveal- Non Tender, Non Distended + BS, no rebound or guarding.   Neurologic Cranial Nerve exam:- CN III-XII intact(No nystagmus), symmetric smile. Strength:- 5/5 equal and symmetric strength both upper and lower extremities.     Assessment & Plan:  You appear to have a urinary tract infection by symptom report. I am prescribing cipro antibiotic for the possible  infection. Hydrate well. I am sending out a urine culture. During the interim if your signs and symptoms worsen rather than improving please notify us. We will notify your when the culture results are back.  If you get yeast infection while on antibiotic then start diflucan.   If symptoms persist or change then would consider urine ancillary studies. If just frequent urination consider checking sugar level but on urine study today no sugar seen.   If you give me name of the group/specialist that did former epidural then I can put that order in and ask them to coordinate epidural with [facility].   Follow up in 7 days or as needed.  Last epidural done by Sebastian Ache MD.

## 2017-12-14 NOTE — Telephone Encounter (Signed)
See referral for epidural please.

## 2017-12-16 LAB — URINE CULTURE
MICRO NUMBER: 90436038
SPECIMEN QUALITY:: ADEQUATE

## 2017-12-16 NOTE — Addendum Note (Signed)
Addended by: Orlene OchRENCE, Xavious Sharrar N on: 12/16/2017 01:38 PM   Modules accepted: Orders

## 2017-12-16 NOTE — Addendum Note (Signed)
Addended by: Orlene OchRENCE, Ellenora Talton N on: 12/16/2017 01:26 PM   Modules accepted: Orders

## 2017-12-17 ENCOUNTER — Other Ambulatory Visit: Payer: Self-pay | Admitting: Medical

## 2017-12-17 NOTE — Telephone Encounter (Addendum)
Received call from Radiology requesting Order [id number] be entered into Epic for the lumbar ESI steroid injection that you are wanting pt to have. They would then like the order faxed to them at [phone].

## 2017-12-20 NOTE — Telephone Encounter (Signed)
Orders placed and faxed ti [facility].

## 2017-12-20 NOTE — Addendum Note (Signed)
Addended by: Orlene OchRENCE, Gridley Wenzlick N on: 12/20/2017 08:35 AM   Modules accepted: Orders

## 2017-12-21 ENCOUNTER — Telehealth: Payer: Self-pay | Admitting: Medical

## 2017-12-21 ENCOUNTER — Encounter: Payer: Self-pay | Admitting: Medical

## 2017-12-21 MED ORDER — FLUCONAZOLE 150 MG PO TABS: 150.0000 mg | ORAL_TABLET | Freq: Once | ORAL | 0 refills | Status: AC

## 2017-12-21 NOTE — Telephone Encounter (Signed)
rx diflucan sent to pt pharmacy at her request with my chart. See my response.

## 2017-12-27 ENCOUNTER — Inpatient Hospital Stay
Admission: RE | Admit: 2017-12-27 | Discharge: 2017-12-27 | Disposition: A | Discharge status: Home / Self Care | Payer: Medicare Other | Source: Ambulatory Visit | Attending: Medical | Admitting: Medical

## 2017-12-28 ENCOUNTER — Other Ambulatory Visit: Payer: Self-pay | Admitting: Medical

## 2017-12-28 DIAGNOSIS — M5441 Lumbago with sciatica, right side: Secondary | ICD-10-CM

## 2018-01-04 ENCOUNTER — Telehealth: Payer: Self-pay | Admitting: Medical

## 2018-01-04 ENCOUNTER — Encounter: Payer: Self-pay | Admitting: Medical

## 2018-01-04 NOTE — Telephone Encounter (Signed)
I did print letter to accommodate 1st floor apartment request. Will you look for that on printer or pull that letter and print. Then I will sign.

## 2018-01-05 ENCOUNTER — Ambulatory Visit
Admission: RE | Admit: 2018-01-05 | Discharge: 2018-01-05 | Disposition: A | Discharge status: Home / Self Care | Payer: Medicare Other | Source: Ambulatory Visit | Attending: Medical | Admitting: Medical

## 2018-01-05 DIAGNOSIS — M5441 Lumbago with sciatica, right side: Secondary | ICD-10-CM

## 2018-01-05 DIAGNOSIS — M545 Low back pain: Secondary | ICD-10-CM | POA: Diagnosis not present

## 2018-01-05 MED ORDER — METHYLPREDNISOLONE ACETATE 40 MG/ML INJ SUSP (RADIOLOG
120.0000 mg | Freq: Once | INTRAMUSCULAR | Status: AC
  Administered 2018-01-05: 120 mg via EPIDURAL

## 2018-01-05 MED ORDER — IOPAMIDOL (ISOVUE-M 200) INJECTION 41%
1.0000 mL | Freq: Once | INTRAMUSCULAR | Status: AC
  Administered 2018-01-05: 1 mL via EPIDURAL

## 2018-01-05 NOTE — Telephone Encounter (Signed)
Can you access my chart message? Are some going to you and are you reviewing some of messages before they get sent to me? Some messages could be reviewed and handled. I think this is the way other CMA are helping out.  I don't know?  I typically don't send work notes through my chart? I don't know how?

## 2018-01-05 NOTE — Telephone Encounter (Signed)
Letter printed and up front for pick up

## 2018-01-05 NOTE — Discharge Instructions (Signed)

## 2018-01-24 ENCOUNTER — Other Ambulatory Visit: Payer: Self-pay | Admitting: Medical

## 2018-01-25 ENCOUNTER — Other Ambulatory Visit: Payer: Self-pay | Admitting: Medical

## 2018-01-26 ENCOUNTER — Other Ambulatory Visit: Payer: Self-pay | Admitting: Medical

## 2018-02-01 ENCOUNTER — Other Ambulatory Visit: Payer: Self-pay | Admitting: Medical

## 2018-02-12 ENCOUNTER — Other Ambulatory Visit: Payer: Self-pay | Admitting: Medical

## 2018-03-20 ENCOUNTER — Other Ambulatory Visit: Payer: Self-pay | Admitting: Medical

## 2018-04-15 ENCOUNTER — Other Ambulatory Visit: Payer: Self-pay | Admitting: Medical

## 2018-04-24 ENCOUNTER — Other Ambulatory Visit: Payer: Self-pay | Admitting: Medical

## 2018-05-24 ENCOUNTER — Other Ambulatory Visit: Payer: Self-pay | Admitting: Medical

## 2018-05-30 ENCOUNTER — Other Ambulatory Visit: Payer: Self-pay | Admitting: Medical

## 2018-06-08 ENCOUNTER — Encounter: Payer: Self-pay | Admitting: Medical

## 2018-06-15 ENCOUNTER — Other Ambulatory Visit: Payer: Self-pay | Admitting: Medical

## 2018-06-15 DIAGNOSIS — I10 Essential (primary) hypertension: Secondary | ICD-10-CM

## 2018-06-16 NOTE — Telephone Encounter (Signed)
Rx amlodipine sent to pt pharmacy. 

## 2018-06-16 NOTE — Telephone Encounter (Signed)
Amlodopine refill request, LOV 12/2017. Order left pended. Routed to PCP to advise.

## 2018-06-27 ENCOUNTER — Other Ambulatory Visit: Payer: Self-pay | Admitting: Medical

## 2018-06-28 ENCOUNTER — Other Ambulatory Visit: Payer: Self-pay | Admitting: Medical

## 2018-07-02 ENCOUNTER — Other Ambulatory Visit: Payer: Self-pay | Admitting: Medical

## 2018-07-02 DIAGNOSIS — I10 Essential (primary) hypertension: Secondary | ICD-10-CM

## 2018-07-04 ENCOUNTER — Telehealth: Payer: Self-pay | Admitting: Medical

## 2018-07-04 ENCOUNTER — Encounter: Payer: Self-pay | Admitting: Medical

## 2018-07-04 ENCOUNTER — Other Ambulatory Visit: Payer: Self-pay | Admitting: Medical

## 2018-07-04 MED ORDER — MELOXICAM 15 MG PO TABS: ORAL_TABLET | ORAL | 0 refills | Status: DC

## 2018-07-04 MED ORDER — CYCLOBENZAPRINE HCL 10 MG PO TABS: 10.0000 mg | ORAL_TABLET | Freq: Every day | ORAL | 0 refills | Status: DC

## 2018-07-04 MED ORDER — AMLODIPINE BESYLATE 10 MG PO TABS: 10.0000 mg | ORAL_TABLET | Freq: Every day | ORAL | 0 refills | Status: DC

## 2018-07-04 NOTE — Telephone Encounter (Signed)
Rx pharmacy mobic and flexeril sent to pt pharmacy.

## 2018-07-08 ENCOUNTER — Ambulatory Visit (INDEPENDENT_AMBULATORY_CARE_PROVIDER_SITE_OTHER): Payer: Medicare Other | Admitting: Medical

## 2018-07-08 ENCOUNTER — Encounter: Payer: Self-pay | Admitting: Medical

## 2018-07-08 VITALS — BP 130/85 | HR 78 | Temp 98.4°F | Resp 16 | Ht <= 58 in | Wt 157.8 lb

## 2018-07-08 DIAGNOSIS — M545 Low back pain, unspecified: Secondary | ICD-10-CM

## 2018-07-08 DIAGNOSIS — G43809 Other migraine, not intractable, without status migrainosus: Secondary | ICD-10-CM

## 2018-07-08 DIAGNOSIS — I10 Essential (primary) hypertension: Secondary | ICD-10-CM | POA: Diagnosis not present

## 2018-07-08 LAB — COMPREHENSIVE METABOLIC PANEL
ALK PHOS: 57 U/L (ref 39–117)
ALT: 6 U/L (ref 0–35)
AST: 12 U/L (ref 0–37)
Albumin: 4 g/dL (ref 3.5–5.2)
BILIRUBIN TOTAL: 0.3 mg/dL (ref 0.2–1.2)
BUN: 10 mg/dL (ref 6–23)
CALCIUM: 9.2 mg/dL (ref 8.4–10.5)
CO2: 29 mEq/L (ref 19–32)
Chloride: 107 mEq/L (ref 96–112)
Creatinine, Ser: 0.62 mg/dL (ref 0.40–1.20)
GFR: 135 mL/min (ref >60.00)
GLUCOSE: 60 mg/dL — AB (ref 70–99)
Sodium: 141 mEq/L (ref 135–145)
TOTAL PROTEIN: 6.3 g/dL (ref 6.0–8.3)

## 2018-07-08 MED ORDER — MELOXICAM 15 MG PO TABS: ORAL_TABLET | ORAL | 3 refills | Status: DC

## 2018-07-08 NOTE — Progress Notes (Signed)
Subjective:    Patient ID: Phyllis Adams, female    DOB: [birth year], 42 y.o.   MRN: [id number]  HPI  Pt in for follow up.  Pt has htn hx. Pt is well controlled. No cardiac or neuro signs or symptoms. She is on amlodipine.   Pt has rt leg pain and lower back pain. She relies on mobic and flexeril. Also has been seeing interventional radiologist. Sees [facility] in dec 2018(epidurals helped a lot). Pain usually gets worse in cold whether. Pt is still working at [employer]. Able to manage pain with current regimen and intermittent epidural. Still getting low back pain with radicular pain at times. Epidural injection help significantly in past. Pt feels pain coming on recently and wants referral again.  Pt migraines have been controlled. She states months since last migraine. Pt takes imitrex occasional and zofran.  LMP- 3 weeks.  Review of Systems  Constitutional: Negative for activity change, chills, diaphoresis, fatigue and fever.  Respiratory: Negative for cough, chest tightness and shortness of breath.   Cardiovascular: Negative for chest pain, palpitations and leg swelling.  Gastrointestinal: Negative for abdominal pain, nausea and vomiting.  Musculoskeletal: Positive for back pain. Negative for neck pain and neck stiffness.       Rt thigh area pain.  Neurological: Negative for dizziness, numbness and headaches.  Psychiatric/Behavioral: Negative for agitation, behavioral problems and confusion. The patient is not nervous/anxious.     Past Medical History:  Diagnosis Date  . Depression   . Femur fracture (HCC) 2006  . Hypertension   . Migraine   . Mood disorder [facility])      Social History   Socioeconomic History  . Marital status: Divorced    Spouse name: Not on file  . Number of children: Not on file  . Years of education: Not on file  . Highest education level: Not on file  Occupational History  . Not on file  Social Needs  . Financial resource strain: Not on  file  . Food insecurity:    Worry: Not on file    Inability: Not on file  . Transportation needs:    Medical: Not on file    Non-medical: Not on file  Tobacco Use  . Smoking status: Current Every Day Smoker    Packs/day: 0.00    Types: Cigars  . Smokeless tobacco: Never Used  . Tobacco comment: 3 cigars with marijuiana  Substance and Sexual Activity  . Alcohol use: No  . Drug use: Yes    Types: Marijuana  . Sexual activity: Yes    Birth control/protection: Other-see comments, Surgical  Lifestyle  . Physical activity:    Days per week: Not on file    Minutes per session: Not on file  . Stress: Not on file  Relationships  . Social connections:    Talks on phone: Not on file    Gets together: Not on file    Attends religious service: Not on file    Active member of club or organization: Not on file    Attends meetings of clubs or organizations: Not on file    Relationship status: Not on file  . Intimate partner violence:    Fear of current or ex partner: Not on file    Emotionally abused: Not on file    Physically abused: Not on file    Forced sexual activity: Not on file  Other Topics Concern  . Not on file  Social History Narrative  . Not  on file    Past Surgical History:  Procedure Laterality Date  . APPENDECTOMY    . CHOLECYSTECTOMY    . FEMUR FRACTURE SURGERY Right 2006  . FEMUR IM ROD REMOVAL Right 03/11/2016  . TUBAL LIGATION      Family History  Problem Relation Age of Onset  . Hypertension Mother   . Hypertension Maternal Aunt   . Hypertension Paternal Uncle   . Hypertension Maternal Grandmother   . Hypertension Maternal Grandfather     Allergies  Allergen Reactions  . Tramadol Shortness Of Breath and Nausea And Vomiting  . Hydrocodone-Acetaminophen Other (See Comments)    Sweats, heavy breathing  . Acetaminophen-Codeine Nausea Only    Current Outpatient Medications on File Prior to Visit  Medication Sig Dispense Refill  . acetaminophen  (TYLENOL) 500 MG tablet Take 1,000 mg by mouth every 6 (six) hours as needed for mild pain or headache.    [name] amLODipine (NORVASC) 10 MG tablet Take 1 tablet (10 mg total) by mouth daily. 90 tablet 0  . Butalbital-APAP-Caffeine (FIORICET) 50-300-40 MG CAPS 1 tab po q 8 hrs as needed HA (Patient taking differently: Take 1 tablet by mouth every 8 (eight) hours as needed (for headache). 1 tab po q 8 hrs as needed HA) 30 capsule 0  . ciprofloxacin (CIPRO) 250 MG tablet Take 1 tablet (250 mg total) by mouth 2 (two) times daily. 6 tablet 0  . cyclobenzaprine (FLEXERIL) 10 MG tablet TAKE 1 TABLET BY MOUTH THREE TIMES DAILY AS NEEDED FOR MUSCLE SPASMS 30 tablet 0  . cyclobenzaprine (FLEXERIL) 10 MG tablet Take 1 tablet (10 mg total) by mouth at bedtime. 30 tablet 0  . fluticasone (FLONASE) 50 MCG/ACT nasal spray Place 2 sprays into both nostrils daily. (Patient taking differently: Place 2 sprays into both nostrils daily as needed. ) 16 g 1  . meloxicam (MOBIC) 15 MG tablet TAKE 1 TABLET(15 MG) BY MOUTH DAILY 30 tablet 0  . ondansetron (ZOFRAN-ODT) 8 MG disintegrating tablet DISSOLVE 1 TABLET(8 MG) ON THE TONGUE EVERY 8 HOURS AS NEEDED FOR NAUSEA OR VOMITING 15 tablet 0  . pregabalin (LYRICA) 25 MG capsule Take 1 capsule (25 mg total) by mouth 3 (three) times daily. 90 capsule 0  . SUMAtriptan (IMITREX) 50 MG tablet TAKE 1 TABLET BY MOUTH AT ONSET OF HEADACHE. MAY REPEAT 2 HOURS LATER IF HEADACHE PERSISTS. DO NOT TAKE MORE THAN 2 PER DAY 10 tablet 0  . VENTOLIN HFA 108 (90 Base) MCG/ACT inhaler INHALE 2 PUFFS INTO THE LUNGS EVERY 6 HOURS AS NEEDED FOR WHEEZING OR SHORTNESS OF BREATH 18 g 0   No current facility-administered medications on file prior to visit.     BP 130/85   Pulse 78   Temp 98.4 F (36.9 C) (Oral)   Resp 16   Ht 4\' 10"  (1.473 m)   Wt 157 lb 12.8 oz (71.6 kg)   SpO2 100%   BMI 32.98 kg/m       Objective:   Physical Exam   General Mental Status- Alert. General Appearance- Not  in acute distress.   Skin General: Color- Normal Color. Moisture- Normal Moisture.  Neck Carotid Arteries- Normal color. Moisture- Normal Moisture. No carotid bruits. No JVD.  Chest and Lung Exam Auscultation: Breath Sounds:-Normal.  Cardiovascular Auscultation:Rythm- Regular. Murmurs & Other Heart Sounds:Auscultation of the heart reveals- No Murmurs.  Abdomen Inspection:-Inspeection Normal. Palpation/Percussion:Note:No mass. Palpation and Percussion of the abdomen reveal- Non Tender, Non Distended + BS, no rebound or guarding.  Neurologic Cranial Nerve exam:- CN III-XII intact(No nystagmus), symmetric smile. Strength:- 5/5 equal and symmetric strength both upper and lower extremities.  Back- mild mid L-for your spine pain and rt si tenderness   Rt lower ext- 5/5 ext strength symmetric. l5-s1 sensation intact presently     Assessment & Plan:  Your blood pressure is well controlled today.  Continue with amlodipine.  Migraines currently well controlled.  Continue with regimen of Imitrex and Zofran.  For history of chronic back pain with sciatica, I refilled your meloxicam and you have Flexeril to use as needed.  Also made referral to [facility] imaging so you can have a repeat epidural as that has helped you in the past.  If you do not get a call from them within a couple weeks send my chart me so I can follow-up on that referral.  Please get CMP today.  Follow-up date to be determined after lab review.  Esperanza Richters, PA-C

## 2018-07-08 NOTE — Patient Instructions (Addendum)
Your blood pressure is well controlled today.  Continue with amlodipine.  Migraines currently well controlled.  Continue with regimen of Imitrex and Zofran.  For history of chronic back pain with sciatica, I refilled your meloxicam and you have Flexeril to use as needed.  Also made referral to [facility] imaging so you can have a repeat epidural as that has helped you in the past.  If you do not get a call from them within a couple weeks send my chart me so I can follow-up on that referral.  Please get CMP today.  Follow-up date to be determined after lab review.

## 2018-07-11 ENCOUNTER — Other Ambulatory Visit: Payer: Self-pay | Admitting: Medical

## 2018-07-11 DIAGNOSIS — M545 Low back pain, unspecified: Secondary | ICD-10-CM

## 2018-07-28 ENCOUNTER — Ambulatory Visit
Admission: RE | Admit: 2018-07-28 | Discharge: 2018-07-28 | Disposition: A | Discharge status: Home / Self Care | Payer: Medicare Other | Source: Ambulatory Visit | Attending: Medical | Admitting: Medical

## 2018-07-28 DIAGNOSIS — M545 Low back pain, unspecified: Secondary | ICD-10-CM

## 2018-07-28 MED ORDER — IOPAMIDOL (ISOVUE-M 200) INJECTION 41%
1.0000 mL | Freq: Once | INTRAMUSCULAR | Status: AC
  Administered 2018-07-28: 1 mL via EPIDURAL

## 2018-07-28 MED ORDER — METHYLPREDNISOLONE ACETATE 40 MG/ML INJ SUSP (RADIOLOG
120.0000 mg | Freq: Once | INTRAMUSCULAR | Status: AC
  Administered 2018-07-28: 120 mg via EPIDURAL

## 2018-07-28 NOTE — Discharge Instructions (Signed)

## 2018-08-07 ENCOUNTER — Other Ambulatory Visit: Payer: Self-pay | Admitting: Medical

## 2018-08-09 NOTE — Telephone Encounter (Signed)
Okay to fill Flexril? Please advise.

## 2018-08-09 NOTE — Telephone Encounter (Signed)
Rx flexeril sent to pt pharmacy. 

## 2018-09-12 ENCOUNTER — Other Ambulatory Visit: Payer: Self-pay | Admitting: Medical

## 2018-10-03 ENCOUNTER — Other Ambulatory Visit: Payer: Self-pay | Admitting: Medical

## 2018-10-03 DIAGNOSIS — I10 Essential (primary) hypertension: Secondary | ICD-10-CM

## 2018-10-16 ENCOUNTER — Other Ambulatory Visit: Payer: Self-pay | Admitting: Medical

## 2018-11-22 ENCOUNTER — Other Ambulatory Visit: Payer: Self-pay | Admitting: Medical

## 2018-12-07 ENCOUNTER — Encounter: Payer: Self-pay | Admitting: Medical

## 2018-12-08 NOTE — Telephone Encounter (Signed)
Called pt to set up webex. Pt declined virtual visit. Pt states talking over the phone will not help her with back and leg pain.

## 2018-12-18 ENCOUNTER — Other Ambulatory Visit: Payer: Self-pay | Admitting: Medical

## 2018-12-18 ENCOUNTER — Encounter: Payer: Self-pay | Admitting: Medical

## 2018-12-19 ENCOUNTER — Telehealth: Payer: Self-pay | Admitting: Medical

## 2018-12-19 ENCOUNTER — Other Ambulatory Visit: Payer: Self-pay | Admitting: Medical

## 2018-12-19 NOTE — Telephone Encounter (Signed)
Pt has severe HA. I can't do that via telephone visit. Virtual visit might work. Can do modified neuro exam. Offer virtual visit. Then may arrange for her to come by and get toradol im. But also go ahead and screen her for other symptoms such as cough, fever, chest congestion etc. As would not have her come in for toradol if that is needed

## 2018-12-20 ENCOUNTER — Ambulatory Visit (INDEPENDENT_AMBULATORY_CARE_PROVIDER_SITE_OTHER): Payer: Medicare Other | Admitting: Medical

## 2018-12-20 ENCOUNTER — Other Ambulatory Visit: Payer: Self-pay

## 2018-12-20 ENCOUNTER — Encounter: Payer: Self-pay | Admitting: Medical

## 2018-12-20 VITALS — Ht <= 58 in | Wt 155.0 lb

## 2018-12-20 DIAGNOSIS — G43809 Other migraine, not intractable, without status migrainosus: Secondary | ICD-10-CM | POA: Diagnosis not present

## 2018-12-20 MED ORDER — BUTALBITAL-ASPIRIN-CAFFEINE 50-325-40 MG PO CAPS: 1.0000 | ORAL_CAPSULE | Freq: Four times a day (QID) | ORAL | 0 refills | Status: DC | PRN

## 2018-12-20 MED ORDER — SUMATRIPTAN SUCCINATE 50 MG PO TABS: ORAL_TABLET | ORAL | 0 refills | Status: DC

## 2018-12-20 NOTE — Patient Instructions (Signed)
Patient appears to have had recent migraine this weekend and was out of her Imitrex.  Headache was severe(/10) and is now tapering off with current headache level 5 out of 10.  I did refill her Imitrex today and for current headache did make Fiorinal prescription available.  Asked her to get new batteries for her electronic blood pressure cuff at the pharmacy today.  Check blood pressure before taking Fiorinal.  Notified patient that fuirinial prescription does have some caffeine therefore if her blood pressure is elevated advised her not to take the Fiorinal but to notify us.  Provided headache subsides advised patient to take Imitrex and early onset manner and to use  Fiorinal only as backup.  Patient expressed understanding.  Follow-up as needed for any persisting or recurrent headaches.  Also will eventually have her follow-up once viral pandemic situation resolves.

## 2018-12-20 NOTE — Telephone Encounter (Signed)
Would you look at printer. Imitrex printed. Can you fax that to pt pharmacy. I think there were too many characters be sent electronically.

## 2018-12-20 NOTE — Progress Notes (Signed)
   Subjective:    Patient ID: Phyllis Adams, female    DOB: 09-15-1975, 43 y.o.   MRN: [id number]  HPI  Virtual Visit via Video Note  I connected with Neriyah Vest on 12/20/18 at 11:20 AM EDT by a video enabled telemedicine application and verified that I am speaking with the correct person using two identifiers.   I discussed the limitations of evaluation and management by telemedicine and the availability of in person appointments. The patient expressed understanding and agreed to proceed.  History of Present Illness: Reports ha that started Saturday. Coincided around her menstrual cycle. She states has not had migraine recently and she states had ran out of imitriex. Pt states hydrated and took tylenol. Pt presently ha 5/10 now. She states on saturday had nauseau, vomiting, and light sensitivity.  Pt has not checked her bp. Pt is on amlodipine.Pt has bp cuff at home but no batteries.   No gross motor or neurologic deficits.   Observations/Objective: No acute distress. Symmetric smile.  Finger to nose intact.  No upper ext drift.  Assessment and Plan: Patient appears to have had recent migraine this weekend and was out of her Imitrex.  Headache was severe(/10) and is now tapering off with current headache level 5 out of 10.  I did refill her Imitrex today and for current headache did make Fiorinal prescription available.  Asked her to get new batteries for her electronic blood pressure cuff at the pharmacy today.  Check blood pressure before taking Fiorinal.  Notified patient that fuirinial prescription does have some caffeine therefore if her blood pressure is elevated advised her not to take the Fiorinal but to notify us.  Provided headache subsides advised patient to take Imitrex and early onset manner and to use  Fiorinal only as backup.  Patient expressed understanding.  Follow-up as needed for any persisting or recurrent headaches.  Also will eventually have her follow-up once  viral pandemic situation resolves.  Esperanza Richters, PA-C  Follow Up Instructions:    I discussed the assessment and treatment plan with the patient. The patient was provided an opportunity to ask questions and all were answered. The patient agreed with the plan and demonstrated an understanding of the instructions.   The patient was advised to call back or seek an in-person evaluation if the symptoms worsen or if the condition fails to improve as anticipated.  I provided 25 minutes of non-face-to-face time during this encounter.   Esperanza Richters, PA-C    Review of Systems  Respiratory: Negative for cough, chest tightness, shortness of breath and wheezing.   Neurological: Positive for headaches. Negative for dizziness, speech difficulty, weakness, light-headedness and numbness.  Hematological: Negative for adenopathy. Does not bruise/bleed easily.       Objective:   Physical Exam See objective       Assessment & Plan:

## 2018-12-20 NOTE — Telephone Encounter (Signed)
Rx meloxicam refill sent to pt pharmacy. On Review. No hx of anemia.

## 2018-12-20 NOTE — Telephone Encounter (Signed)
Virtual visit scheduled.  

## 2018-12-21 ENCOUNTER — Other Ambulatory Visit: Payer: Self-pay | Admitting: Medical

## 2019-01-01 ENCOUNTER — Other Ambulatory Visit: Payer: Self-pay | Admitting: Medical

## 2019-01-02 NOTE — Telephone Encounter (Signed)
Pt requesting refill on Cyclobenzaprine last rx 11/22/18.  Please advise

## 2019-01-02 NOTE — Telephone Encounter (Signed)
Rx refill of flexeril sent.

## 2019-01-15 ENCOUNTER — Encounter: Payer: Self-pay | Admitting: Medical

## 2019-01-16 ENCOUNTER — Telehealth: Payer: Self-pay | Admitting: Medical

## 2019-01-16 DIAGNOSIS — M545 Low back pain, unspecified: Secondary | ICD-10-CM

## 2019-01-16 NOTE — Telephone Encounter (Signed)
Referral to [facility] radiologist placed.

## 2019-01-19 ENCOUNTER — Encounter: Payer: Self-pay | Admitting: Medical

## 2019-01-24 ENCOUNTER — Telehealth: Payer: Self-pay | Admitting: Medical

## 2019-01-24 NOTE — Telephone Encounter (Signed)
Please advise 

## 2019-01-24 NOTE — Telephone Encounter (Signed)
Copied from CRM [id number]. Topic: Quick Communication - See Telephone Encounter >> Jan 24, 2019  9:04 AM Fanny Bien wrote: CRM for notification. See Telephone encounter for: 01/24/19. Pt called and stated that she would like a cortisone injection done at [facility] imaging. Pt would like a call back regarding. Pt states that she is in pain and can not drive ar this time. Please advise

## 2019-01-24 NOTE — Telephone Encounter (Signed)
I put in that referral on 01/16/2019. Will you ask Gwenn to follow up on that referral and coordinate that referral with pt.

## 2019-02-15 ENCOUNTER — Other Ambulatory Visit: Payer: Self-pay | Admitting: Medical

## 2019-02-15 DIAGNOSIS — G8929 Other chronic pain: Secondary | ICD-10-CM

## 2019-02-15 DIAGNOSIS — M545 Low back pain, unspecified: Secondary | ICD-10-CM

## 2019-02-20 ENCOUNTER — Other Ambulatory Visit: Payer: Self-pay | Admitting: Medical

## 2019-02-20 NOTE — Telephone Encounter (Signed)
Rx refill of flexeril sent to pt pharmacy.

## 2019-02-20 NOTE — Telephone Encounter (Signed)
Pt requesting refill on cyclobenzaprine last refill 01/02/19 last appointment  12/20/18

## 2019-03-02 ENCOUNTER — Other Ambulatory Visit: Payer: Self-pay

## 2019-03-02 ENCOUNTER — Ambulatory Visit
Admission: RE | Admit: 2019-03-02 | Discharge: 2019-03-02 | Disposition: A | Discharge status: Home / Self Care | Payer: Medicare Other | Source: Ambulatory Visit | Attending: Medical | Admitting: Medical

## 2019-03-02 DIAGNOSIS — M545 Low back pain: Secondary | ICD-10-CM | POA: Diagnosis not present

## 2019-03-02 DIAGNOSIS — G8929 Other chronic pain: Secondary | ICD-10-CM

## 2019-03-02 MED ORDER — IOPAMIDOL (ISOVUE-M 200) INJECTION 41%
1.0000 mL | Freq: Once | INTRAMUSCULAR | Status: AC
  Administered 2019-03-02: 1 mL via EPIDURAL

## 2019-03-02 MED ORDER — METHYLPREDNISOLONE ACETATE 40 MG/ML INJ SUSP (RADIOLOG
120.0000 mg | Freq: Once | INTRAMUSCULAR | Status: AC
  Administered 2019-03-02: 120 mg via EPIDURAL

## 2019-03-02 NOTE — Discharge Instructions (Signed)

## 2019-04-02 ENCOUNTER — Other Ambulatory Visit: Payer: Self-pay | Admitting: Medical

## 2019-04-03 ENCOUNTER — Other Ambulatory Visit: Payer: Self-pay

## 2019-04-03 MED ORDER — BUTALBITAL-ASPIRIN-CAFFEINE 50-325-40 MG PO CAPS: ORAL_CAPSULE | ORAL | 0 refills | Status: DC

## 2019-05-10 ENCOUNTER — Other Ambulatory Visit: Payer: Self-pay | Admitting: Medical

## 2019-05-26 ENCOUNTER — Other Ambulatory Visit: Payer: Self-pay | Admitting: *Deleted

## 2019-05-26 DIAGNOSIS — I10 Essential (primary) hypertension: Secondary | ICD-10-CM

## 2019-05-26 MED ORDER — AMLODIPINE BESYLATE 10 MG PO TABS: ORAL_TABLET | ORAL | 0 refills | Status: DC

## 2019-05-30 ENCOUNTER — Encounter: Payer: Self-pay | Admitting: Medical

## 2019-05-31 ENCOUNTER — Encounter: Payer: Self-pay | Admitting: Medical

## 2019-05-31 NOTE — Telephone Encounter (Signed)
Does pt need an appointment? Please advise.

## 2019-06-26 ENCOUNTER — Other Ambulatory Visit: Payer: Self-pay | Admitting: *Deleted

## 2019-06-26 MED ORDER — CYCLOBENZAPRINE HCL 10 MG PO TABS: ORAL_TABLET | ORAL | 0 refills | Status: DC

## 2019-07-10 ENCOUNTER — Encounter: Payer: Self-pay | Admitting: Medical

## 2019-07-10 ENCOUNTER — Other Ambulatory Visit: Payer: Self-pay

## 2019-07-10 ENCOUNTER — Ambulatory Visit (INDEPENDENT_AMBULATORY_CARE_PROVIDER_SITE_OTHER): Payer: Medicare Other | Admitting: Medical

## 2019-07-10 DIAGNOSIS — M545 Low back pain, unspecified: Secondary | ICD-10-CM

## 2019-07-10 DIAGNOSIS — I10 Essential (primary) hypertension: Secondary | ICD-10-CM | POA: Diagnosis not present

## 2019-07-10 DIAGNOSIS — G43809 Other migraine, not intractable, without status migrainosus: Secondary | ICD-10-CM

## 2019-07-10 NOTE — Patient Instructions (Signed)
For low back pain with radicular symptoms will refer again to interventional radiology as you have gotten significant relief from pain in past. Referral placed today.  For neck pain can continue with periodic use mobic and muscle relaxant.  For migraines, continue with imitrex.  For htn continue with amlodipine. Check bp to confirm controlled. Future order cmp and lipid panel placed. Please get done within next 2 weeks. Need to call and get scheduled.  Follow up date to be determined after lab review.

## 2019-07-10 NOTE — Progress Notes (Signed)
Subjective:    Patient ID: Phyllis Adams, female    DOB: Sep 02, 1976, 43 y.o.   MRN: [id number]  HPI  Virtual Visit via Video Note  I connected with Phyllis Adams on 07/10/19 at  1:40 PM EST by a video enabled telemedicine application and verified that I am speaking with the correct person using two identifiers.  Location: Patient: home Provider: office  Pt did not check vitals today.   I discussed the limitations of evaluation and management by telemedicine and the availability of in person appointments. The patient expressed understanding and agreed to proceed.  History of Present Illness:  Pt has hx of back pain. She states 4.5 months since her last epidural.   Pt needs refer for epidural injections. Hx of low back pain with sciatica symptoms. Refer to [facility]. She has been there before. Pt state recently back pain with radicular symptoms worsening.No red flag signs/symptoms on review.  She has had epidural injections over past 2 years per her report and recollection.  Ct lumbar spine report in past. L4-5: Diffuse disc bulge, eccentric to the left. Reactive endplate changes. Mild bilateral facet arthrosis, slightly worse on the left. No significant canal stenosis. No frank neural impingement appreciated. Foramina appear grossly patent.  L5-S1: Diffuse disc bulge without definite focal disc herniation. Reactive endplate changes. Mild bilateral facet arthrosis. No significant canal or foraminal stenosis.   Pt has not checked her bp recently. No cardiac or neurologic signs or symptoms. At end of oct 129/84. She is on amlodipine.  She has hx of some neck pain. She has some c6-c7 degenerative changes. She use meloxicam and muscle relaxant for this when has pain or mild radicular pain.  She gets occasional migraines. No ha recently since early September. Often around menses.  Pt declines flu vaccine.   Observations/Objective:  General-no acute distress,  pleasant, oriented. Lungs- on inspection lungs appear unlabored. Neck- no tracheal deviation or jvd on inspection. Neuro- gross motor function appears intact.  Assessment and Plan: For low back pain with radicular symptoms will refer again to interventional radiology as you have gotten significant relief from pain in past. Referral placed today.  For neck pain can continue with periodic use mobic and muscle relaxant.  For migraines, continue with imitrex.  For htn continue with amlodipine. Check bp to confirm controlled. Future order cmp and lipid panel placed. Please get done within next 2 weeks. Need to call and get scheduled.  Follow up date to be determined after lab review.  Follow Up Instructions:    I discussed the assessment and treatment plan with the patient. The patient was provided an opportunity to ask questions and all were answered. The patient agreed with the plan and demonstrated an understanding of the instructions.   The patient was advised to call back or seek an in-person evaluation if the symptoms worsen or if the condition fails to improve as anticipated.  I provided 25 minutes of non-face-to-face time during this encounter.   Esperanza Richters, PA-C   Review of Systems  Constitutional: Negative for chills, fatigue and fever.  Respiratory: Negative for cough, chest tightness, shortness of breath and wheezing.   Cardiovascular: Negative for chest pain and palpitations.  Gastrointestinal: Negative for abdominal pain.  Musculoskeletal: Positive for back pain.  Skin: Negative for rash.  Neurological: Negative for dizziness, syncope, weakness, numbness and headaches.  Hematological: Negative for adenopathy. Does not bruise/bleed easily.  Psychiatric/Behavioral: Negative for behavioral problems, confusion and dysphoric mood. The patient is  not nervous/anxious and is not hyperactive.        Objective:   Physical Exam        Assessment & Plan:

## 2019-07-11 ENCOUNTER — Other Ambulatory Visit: Payer: Self-pay | Admitting: Medical

## 2019-07-11 DIAGNOSIS — G8929 Other chronic pain: Secondary | ICD-10-CM

## 2019-07-11 DIAGNOSIS — M545 Low back pain, unspecified: Secondary | ICD-10-CM

## 2019-07-17 ENCOUNTER — Other Ambulatory Visit: Payer: Medicare Other

## 2019-07-21 ENCOUNTER — Ambulatory Visit
Admission: RE | Admit: 2019-07-21 | Discharge: 2019-07-21 | Disposition: A | Discharge status: Home / Self Care | Payer: Medicare Other | Source: Ambulatory Visit | Attending: Medical | Admitting: Medical

## 2019-07-21 ENCOUNTER — Other Ambulatory Visit: Payer: Self-pay

## 2019-07-21 DIAGNOSIS — M545 Low back pain: Secondary | ICD-10-CM | POA: Diagnosis not present

## 2019-07-21 DIAGNOSIS — G8929 Other chronic pain: Secondary | ICD-10-CM

## 2019-07-21 MED ORDER — IOPAMIDOL (ISOVUE-M 200) INJECTION 41%
1.0000 mL | Freq: Once | INTRAMUSCULAR | Status: AC
  Administered 2019-07-21: 1 mL via EPIDURAL

## 2019-07-21 MED ORDER — METHYLPREDNISOLONE ACETATE 40 MG/ML INJ SUSP (RADIOLOG
120.0000 mg | Freq: Once | INTRAMUSCULAR | Status: AC
  Administered 2019-07-21: 15:00:00 120 mg via EPIDURAL

## 2019-07-21 NOTE — Discharge Instructions (Signed)

## 2019-08-02 ENCOUNTER — Other Ambulatory Visit: Payer: Self-pay

## 2019-08-02 MED ORDER — CYCLOBENZAPRINE HCL 10 MG PO TABS: ORAL_TABLET | ORAL | 0 refills | Status: DC

## 2019-08-17 ENCOUNTER — Telehealth: Payer: Self-pay | Admitting: *Deleted

## 2019-08-17 MED ORDER — MELOXICAM 15 MG PO TABS: ORAL_TABLET | ORAL | 0 refills | Status: DC

## 2019-08-17 NOTE — Telephone Encounter (Signed)
Rx meloxicam sent to pt pharmacy. 

## 2019-08-17 NOTE — Telephone Encounter (Signed)
Received request from [facility] for Meloxicam 15mg  once a day.  Please advise?

## 2019-09-11 ENCOUNTER — Other Ambulatory Visit: Payer: Self-pay | Admitting: Medical

## 2019-09-25 ENCOUNTER — Other Ambulatory Visit: Payer: Self-pay | Admitting: Medical

## 2019-09-25 MED ORDER — CYCLOBENZAPRINE HCL 10 MG PO TABS: ORAL_TABLET | ORAL | 1 refills | Status: DC

## 2019-11-13 ENCOUNTER — Other Ambulatory Visit: Payer: Self-pay | Admitting: Medical

## 2019-11-13 DIAGNOSIS — I10 Essential (primary) hypertension: Secondary | ICD-10-CM

## 2020-01-01 ENCOUNTER — Other Ambulatory Visit: Payer: Self-pay | Admitting: Medical

## 2020-05-21 ENCOUNTER — Ambulatory Visit (INDEPENDENT_AMBULATORY_CARE_PROVIDER_SITE_OTHER): Payer: Medicaid Other | Admitting: Medical

## 2020-05-21 ENCOUNTER — Other Ambulatory Visit: Payer: Self-pay

## 2020-05-21 ENCOUNTER — Ambulatory Visit (HOSPITAL_BASED_OUTPATIENT_CLINIC_OR_DEPARTMENT_OTHER)
Admission: RE | Admit: 2020-05-21 | Discharge: 2020-05-21 | Disposition: A | Discharge status: Home / Self Care | Payer: Medicaid Other | Source: Ambulatory Visit | Attending: Medical | Admitting: Medical

## 2020-05-21 VITALS — BP 137/87 | HR 65 | Resp 18 | Ht <= 58 in | Wt 165.2 lb

## 2020-05-21 DIAGNOSIS — M25561 Pain in right knee: Secondary | ICD-10-CM | POA: Diagnosis present

## 2020-05-21 DIAGNOSIS — Z7185 Encounter for immunization safety counseling: Secondary | ICD-10-CM

## 2020-05-21 DIAGNOSIS — M5416 Radiculopathy, lumbar region: Secondary | ICD-10-CM

## 2020-05-21 DIAGNOSIS — M4726 Other spondylosis with radiculopathy, lumbar region: Secondary | ICD-10-CM | POA: Diagnosis not present

## 2020-05-21 DIAGNOSIS — M79604 Pain in right leg: Secondary | ICD-10-CM

## 2020-05-21 DIAGNOSIS — I10 Essential (primary) hypertension: Secondary | ICD-10-CM

## 2020-05-21 DIAGNOSIS — Z7189 Other specified counseling: Secondary | ICD-10-CM

## 2020-05-21 LAB — COMPREHENSIVE METABOLIC PANEL
AG Ratio: 1.6 (calc) (ref 1.0–2.5)
ALT: 5 U/L — ABNORMAL LOW (ref 6–29)
AST: 12 U/L (ref 10–35)
Albumin: 4.4 g/dL (ref 3.6–5.1)
Alkaline phosphatase (APISO): 70 U/L (ref 31–125)
BUN/Creatinine Ratio: 8 (calc) (ref 6–22)
BUN: 6 mg/dL — ABNORMAL LOW (ref 7–25)
CO2: 29 mmol/L (ref 20–32)
Calcium: 9.9 mg/dL (ref 8.6–10.2)
Chloride: 107 mmol/L (ref 98–110)
Creat: 0.75 mg/dL (ref 0.50–1.10)
Globulin: 2.7 g/dL (calc) (ref 1.9–3.7)
Glucose, Bld: 82 mg/dL (ref 65–99)
Potassium: 4.1 mmol/L (ref 3.5–5.3)
Sodium: 140 mmol/L (ref 135–146)
Total Bilirubin: 0.4 mg/dL (ref 0.2–1.2)
Total Protein: 7.1 g/dL (ref 6.1–8.1)

## 2020-05-21 LAB — CBC WITH DIFFERENTIAL/PLATELET
Absolute Monocytes: 497 cells/uL (ref 200–950)
Basophils Absolute: 22 cells/uL (ref 0–200)
Basophils Relative: 0.3 %
Eosinophils Absolute: 72 cells/uL (ref 15–500)
Eosinophils Relative: 1 %
HCT: 40.3 % (ref 35.0–45.0)
Hemoglobin: 12.7 g/dL (ref 11.7–15.5)
Lymphs Abs: 2398 cells/uL (ref 850–3900)
MCH: 26.7 pg — ABNORMAL LOW (ref 27.0–33.0)
MCHC: 31.5 g/dL — ABNORMAL LOW (ref 32.0–36.0)
MCV: 84.8 fL (ref 80.0–100.0)
MPV: 11.1 fL (ref 7.5–12.5)
Monocytes Relative: 6.9 %
Neutro Abs: 4212 cells/uL (ref 1500–7800)
Neutrophils Relative %: 58.5 %
Platelets: 278 10*3/uL (ref 140–400)
RBC: 4.75 10*6/uL (ref 3.80–5.10)
RDW: 13.1 % (ref 11.0–15.0)
Total Lymphocyte: 33.3 %
WBC: 7.2 10*3/uL (ref 3.8–10.8)

## 2020-05-21 LAB — LIPID PANEL
Cholesterol: 130 mg/dL (ref <200)
HDL: 38 mg/dL — ABNORMAL LOW (ref >=50)
LDL Cholesterol (Calc): 76 mg/dL (calc)
Non-HDL Cholesterol (Calc): 92 mg/dL (calc) (ref <130)
Total CHOL/HDL Ratio: 3.4 (calc) (ref <5.0)
Triglycerides: 76 mg/dL (ref <150)

## 2020-05-21 MED ORDER — AMLODIPINE BESYLATE 10 MG PO TABS: ORAL_TABLET | ORAL | 0 refills | Status: DC

## 2020-05-21 MED ORDER — SUMATRIPTAN SUCCINATE 50 MG PO TABS: ORAL_TABLET | ORAL | 0 refills | Status: DC

## 2020-05-21 MED ORDER — MELOXICAM 15 MG PO TABS: ORAL_TABLET | ORAL | 0 refills | Status: DC

## 2020-05-21 MED ORDER — CYCLOBENZAPRINE HCL 10 MG PO TABS: ORAL_TABLET | ORAL | 1 refills | Status: DC

## 2020-05-21 MED ORDER — BUTALBITAL-ASPIRIN-CAFFEINE 50-325-40 MG PO CAPS: ORAL_CAPSULE | ORAL | 0 refills | Status: DC
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
## 2020-05-21 NOTE — Patient Instructions (Signed)
You do have history of hypertension and blood pressure is little bit borderline today without medication.  Prescribing amlodipine 10 mg today.  Can start out taking 1/2tablet daily for 1 week then check blood pressure daily.  If blood pressure not close to 130/80 then take full 10 mg tablet.  History of lumbar radiculopathy and osteoarthritis.  Refill your meloxicam and Flexeril.  Rx advisement given.  Also placed referral for epidural injections as those have helped in the past.  History of migraine headaches.  No headache presently.  Refilling your Imitrex today.  Also making limited prescription of Fiorinal available.  Rx advisement given.  Not to use Fiorinal and Flexeril at the same time.  Both can sedate.  Vaccine counseling.  I do think overall you would get much benefit from Covid vaccine versus the risk.  Right knee pain.  Will get x-ray.  You do have a little bit of meniscal area pain.  If pain persists then could refer to sports medicine.  Get CBC, CMP and lipid panel today.  Follow-up in 3 to 4 weeks or as needed.

## 2020-06-18 ENCOUNTER — Ambulatory Visit: Payer: Medicaid Other | Admitting: Medical

## 2020-08-15 ENCOUNTER — Encounter: Payer: Self-pay | Admitting: Medical

## 2020-08-15 ENCOUNTER — Other Ambulatory Visit: Payer: Self-pay | Admitting: Medical

## 2020-08-16 ENCOUNTER — Telehealth: Payer: Self-pay | Admitting: Medical

## 2020-08-16 MED ORDER — ALBUTEROL SULFATE HFA 108 (90 BASE) MCG/ACT IN AERS: INHALATION_SPRAY | RESPIRATORY_TRACT | 0 refills | Status: DC

## 2020-08-16 NOTE — Telephone Encounter (Signed)
Rx albuterol sent to pharmacy. See my chart note response.

## 2020-08-20 ENCOUNTER — Telehealth (INDEPENDENT_AMBULATORY_CARE_PROVIDER_SITE_OTHER): Payer: Medicaid Other | Admitting: Medical

## 2020-08-20 ENCOUNTER — Ambulatory Visit: Payer: Medicaid Other | Admitting: Medical

## 2020-08-20 ENCOUNTER — Other Ambulatory Visit: Payer: Self-pay

## 2020-08-20 DIAGNOSIS — U071 COVID-19: Secondary | ICD-10-CM | POA: Diagnosis not present

## 2020-08-20 DIAGNOSIS — R059 Cough, unspecified: Secondary | ICD-10-CM

## 2020-08-20 MED ORDER — BENZONATATE 100 MG PO CAPS: 100.0000 mg | ORAL_CAPSULE | Freq: Three times a day (TID) | ORAL | 0 refills | Status: DC | PRN

## 2020-08-20 NOTE — Patient Instructions (Addendum)
Hx of covid infection. Some intermittent cough and fatigue.   Rest hydrate and benzonatate fo cough.  Will get cxr to make sure no covid pneumonia present.   Recommend getting pulse ox monitor to check 02 sat levels. See if levels less than 96%.  Recent anxiety associated with covid. Let us know if anxiety worsens.  Follow up in 7 days or as needed

## 2020-08-20 NOTE — Progress Notes (Signed)
° °  Subjective:    Patient ID: Phyllis Adams, female    DOB: [birth year], 44 y.o.   MRN: [id number]  HPI  Virtual Visit via Telephone Note  I connected with Phyllis Adams on 08/20/20 at  3:00 PM EST by telephone and verified that I am speaking with the correct person using two identifiers.  Location: Patient: home Provider: office  Pt did not check her bp or pulse.    I discussed the limitations, risks, security and privacy concerns of performing an evaluation and management service by telephone and the availability of in person appointments. I also discussed with the patient that there may be a patient responsible charge related to this service. The patient expressed understanding and agreed to proceed.   History of Present Illness: Pt in for follow up post covid infection. Pt states son got covid first. Pt states she tested + on December 1st. Pt had fever early on. She did have brief mild ha. Pt states she has altered smell and taste. Can't taste her cigars and can't smell cleaning supplies.  No wheezing. Pt does have cough. Pt states her endurance is less. Walking around and feels like she has been running. She also wonders if anxious. She has history of anxiety.   Pt has no pulse ox monitor. No popliteal pain.      Observations/Objective:  General- no acute distress, pleasant and alert.   Assessment and Plan: Hx of covid infection. Some intermittent cough and fatigue.   Rest hydrate and benzonatate fo cough.  Will get cxr to make sure no covid pneumonia present.   Recommend getting pulse ox monitor to check 02 sat levels. See if levels less than 96%.  Recent anxiety associated with covid. Let us know if anxiety worsens.  Follow up in 7 days or as needed  Discussion on return to work usually 14 days post + test. But if covid pneumonia or low 02 sats would recommend be out more days.   Esperanza Richters, PA-C   Follow Up Instructions:    I discussed the assessment  and treatment plan with the patient. The patient was provided an opportunity to ask questions and all were answered. The patient agreed with the plan and demonstrated an understanding of the instructions.   The patient was advised to call back or seek an in-person evaluation if the symptoms worsen or if the condition fails to improve as anticipated.  Time spent with patient today was 20  minutes which consisted of chart revdiew, discussing diagnosis, work up treatment and documentation.   Esperanza Richters, PA-C   Review of Systems     Objective:   Physical Exam        Assessment & Plan:

## 2020-08-21 ENCOUNTER — Other Ambulatory Visit: Payer: Self-pay

## 2020-08-21 ENCOUNTER — Ambulatory Visit (HOSPITAL_BASED_OUTPATIENT_CLINIC_OR_DEPARTMENT_OTHER)
Admission: RE | Admit: 2020-08-21 | Discharge: 2020-08-21 | Disposition: A | Discharge status: Home / Self Care | Payer: Medicaid Other | Source: Ambulatory Visit | Attending: Medical | Admitting: Medical

## 2020-08-21 DIAGNOSIS — U071 COVID-19: Secondary | ICD-10-CM | POA: Insufficient documentation

## 2020-08-21 DIAGNOSIS — R059 Cough, unspecified: Secondary | ICD-10-CM | POA: Diagnosis present

## 2021-01-09 ENCOUNTER — Ambulatory Visit (INDEPENDENT_AMBULATORY_CARE_PROVIDER_SITE_OTHER): Payer: Medicaid Other | Admitting: Medical

## 2021-01-09 ENCOUNTER — Other Ambulatory Visit: Payer: Self-pay

## 2021-01-09 ENCOUNTER — Telehealth: Payer: Self-pay | Admitting: Medical

## 2021-01-09 ENCOUNTER — Encounter: Payer: Self-pay | Admitting: Medical

## 2021-01-09 VITALS — BP 138/79 | HR 74 | Resp 18 | Ht <= 58 in | Wt 155.4 lb

## 2021-01-09 DIAGNOSIS — M5416 Radiculopathy, lumbar region: Secondary | ICD-10-CM

## 2021-01-09 DIAGNOSIS — M79604 Pain in right leg: Secondary | ICD-10-CM

## 2021-01-09 DIAGNOSIS — I10 Essential (primary) hypertension: Secondary | ICD-10-CM | POA: Diagnosis not present

## 2021-01-09 MED ORDER — KETOROLAC TROMETHAMINE 60 MG/2ML IM SOLN
60.0000 mg | Freq: Once | INTRAMUSCULAR | Status: AC
  Administered 2021-01-09: 60 mg via INTRAMUSCULAR

## 2021-01-09 NOTE — Telephone Encounter (Signed)
In sept I put in referral to [facility] interventional. Pt never got called can you see what happened. Pt had medicare/medicaid in past. Now only medicaid? Was that a factor?

## 2021-01-09 NOTE — Addendum Note (Signed)
Addended by: Maximino Sarin on: 01/09/2021 11:25 AM   Modules accepted: Orders

## 2021-01-09 NOTE — Patient Instructions (Signed)
History chronic low back pain with radicular features.  Degenerative changes seen on MRI and obvious disc herniation with neural impingement as well.  Historically responded well to epidural injections.  Will ask referral staff to follow-up on referral to [facility] imaging that placed in September.  Patient states she never got a called.  Today we will give Toradol 60 mg IM injection.  It started tomorrow as get back on meloxicam 15 mg daily and use Flexeril as needed.  Blood pressure is borderline elevated today.  Presently on amlodipine 10 mg daily.  Some mild today's elevation might be due to moderate to high level pain.  Asked to check your blood pressure every other day or if you are symptomatic.  Give [place] update if blood pressure trending above 140/90 consistently.  If so then would add additional BP medication.  Follow-up in 2 months or as needed.  Recommend coming in fasting for that visit and we can do a wellness exam which would include labs.[name]

## 2021-01-09 NOTE — Progress Notes (Signed)
Subjective:    Patient ID: Phyllis Adams, female    DOB: 15-Feb-1976, 45 y.o.   MRN: [id number]  HPI  Pt in for follow up.  On last visit  I put in referral to intervential radiology for her chronic back pain and radiating pain. Pt got epidurals in past and pain has flared/worsened similar to past.  Pt has hx of radiating pain to rt thigh.  Pt states recently pain high level pain moderate to high level when she straightens her back.  Pt still on meloxicam and flexeril. Not taking meloxicam every day. Only as needed.  Prior mri. IMPRESSION: 1. No acute abnormality within the lumbar spine. 2. 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 No significant stenosis identified on this limited noncontrast CT. Further evaluation with dedicated MRI would likely be helpful for complete evaluation. 3. 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  In body of report  L5-S1: Diffuse disc bulge without definite focal disc herniation. Reactive endplate changes. Mild bilateral facet arthrosis. No significant canal or foraminal stenosis.  Pt also has hx of htn. She is on amlodipine. Pt does not check.     Review of Systems  Constitutional: Negative for chills, fatigue and fever.  Respiratory: Negative for cough, chest tightness, shortness of breath and wheezing.   Cardiovascular: Negative for chest pain and palpitations.  Gastrointestinal: Negative for abdominal pain, diarrhea and vomiting.  Genitourinary: Negative for dysuria.  Musculoskeletal: Positive for back pain.  Skin: Negative for rash.  Neurological: Negative for facial asymmetry and headaches.  Hematological: Negative for adenopathy. Does not bruise/bleed easily.  Psychiatric/Behavioral: Negative for behavioral problems, confusion, dysphoric mood and suicidal ideas. The patient is not nervous/anxious.    Past Medical History:  Diagnosis Date  .  Depression   . Femur fracture (HCC) 2006  . Hypertension   . Migraine   . Mood disorder [facility])      Social History   Socioeconomic History  . Marital status: Single    Spouse name: Not on file  . Number of children: Not on file  . Years of education: Not on file  . Highest education level: Not on file  Occupational History  . Not on file  Tobacco Use  . Smoking status: Current Every Day Smoker    Packs/day: 0.00    Types: Cigars  . Smokeless tobacco: Never Used  . Tobacco comment: 3 cigars with marijuiana  Vaping Use  . Vaping Use: Never used  Substance and Sexual Activity  . Alcohol use: No  . Drug use: Yes    Types: Marijuana  . Sexual activity: Yes    Birth control/protection: Other-see comments, Surgical  Other Topics Concern  . Not on file  Social History Narrative  . Not on file   Social Determinants of Health   Financial Resource Strain: Not on file  Food Insecurity: Not on file  Transportation Needs: Not on file  Physical Activity: Not on file  Stress: Not on file  Social Connections: Not on file  Intimate Partner Violence: Not on file    Past Surgical History:  Procedure Laterality Date  . APPENDECTOMY    . CHOLECYSTECTOMY    . FEMUR FRACTURE SURGERY Right 2006  . FEMUR IM ROD REMOVAL Right 03/11/2016  . TUBAL LIGATION      Family History  Problem Relation Age of Onset  . Hypertension Mother   . Hypertension Maternal Aunt   . Hypertension Paternal  Uncle   . Hypertension Maternal Grandmother   . Hypertension Maternal Grandfather     Allergies  Allergen Reactions  . Tramadol Shortness Of Breath and Nausea And Vomiting  . Hydrocodone-Acetaminophen Other (See Comments)    Sweats, heavy breathing  . Acetaminophen-Codeine Nausea Only    Current Outpatient Medications on File Prior to Visit  Medication Sig Dispense Refill  . acetaminophen (TYLENOL) 500 MG tablet Take 1,000 mg by mouth every 6 (six) hours as needed for mild pain or headache.     . albuterol (VENTOLIN HFA) 108 (90 Base) MCG/ACT inhaler INHALE 2 PUFFS INTO THE LUNGS EVERY 6 HOURS AS NEEDED FOR WHEEZING OR SHORTNESS OF BREATH 18 g 0  . amLODipine (NORVASC) 10 MG tablet 1/2-1 tab po q day 90 tablet 0  . cyclobenzaprine (FLEXERIL) 10 MG tablet TAKE 1 TABLET(10 MG) BY MOUTH AT BEDTIME 30 tablet 1  . fluticasone (FLONASE) 50 MCG/ACT nasal spray Place 2 sprays into both nostrils daily. (Patient taking differently: Place 2 sprays into both nostrils daily as needed.) 16 g 1  . meloxicam (MOBIC) 15 MG tablet 1 tab po q day 90 tablet 0  . ondansetron (ZOFRAN-ODT) 8 MG disintegrating tablet DISSOLVE 1 TABLET(8 MG) ON THE TONGUE EVERY 8 HOURS AS NEEDED FOR NAUSEA OR VOMITING 15 tablet 0  . SUMAtriptan (IMITREX) 50 MG tablet May repeat in 2 hours if headache persists or recurs. 10 tablet 0   No current facility-administered medications on file prior to visit.    BP 140/87   Pulse 74   Resp 18   Ht 4\' 10"  (1.473 m)   Wt 155 lb 6.4 oz (70.5 kg)   SpO2 100%   BMI 32.48 kg/m       Objective:   Physical Exam   General Mental Status- Alert. General Appearance- Not in acute distress.   Skin General: Color- Normal Color. Moisture- Normal Moisture.  Neck Carotid Arteries- Normal color. Moisture- Normal Moisture. No carotid bruits. No JVD.  Chest and Lung Exam Auscultation: Breath Sounds:-Normal.  Cardiovascular Auscultation:Rythm- Regular. Murmurs & Other Heart Sounds:Auscultation of the heart reveals- No Murmurs.  Abdomen Inspection:-Inspeection Normal. Palpation/Percussion:Note:No mass. Palpation and Percussion of the abdomen reveal- Non Tender, Non Distended + BS, no rebound or guarding.   Neurologic Cranial Nerve exam:- CN III-XII intact(No nystagmus), symmetric smile. Strength:- 5/5 equal and symmetric strength both upper and lower extremities.  Back- mid lumbar tenderness lower portion and rt si area on palpation.  Bilateral lower ext- L5-S1  sensation intact bilaterally.      Assessment & Plan:  History chronic low back pain with radicular features.  Degenerative changes seen on MRI and obvious disc herniation with neural impingement as well.  Historically responded well to epidural injections.  Will ask referral staff to follow-up on referral to [facility] imaging that placed in September.  Patient states she never got a called.  Today we will give Toradol 60 mg IM injection.  It started tomorrow as get back on meloxicam 15 mg daily and use Flexeril as needed.  Blood pressure is borderline elevated today.  Presently on amlodipine 10 mg daily.  Some mild today's elevation might be due to moderate to high level pain.  Asked to check your blood pressure every other day or if you are symptomatic.  Give October update if blood pressure trending above 140/90 consistently.  If so then would add additional BP medication.  Follow-up in 2 months or as needed.  Recommend coming in fasting for that  visit and we can do a wellness exam which would include labs.Esperanza Richters, PA-C

## 2021-01-15 ENCOUNTER — Encounter: Payer: Self-pay | Admitting: Medical

## 2021-01-15 DIAGNOSIS — M5416 Radiculopathy, lumbar region: Secondary | ICD-10-CM

## 2021-01-15 NOTE — Telephone Encounter (Signed)
Referral approved , told pt via mychart to call [facility] to schedule an appointment

## 2021-01-15 NOTE — Telephone Encounter (Signed)
Referral placed again.

## 2021-01-15 NOTE — Telephone Encounter (Signed)
Please send referral to [facility].

## 2021-01-17 ENCOUNTER — Other Ambulatory Visit: Payer: Self-pay | Admitting: Medical

## 2021-01-17 ENCOUNTER — Encounter: Payer: Self-pay | Admitting: Medical

## 2021-01-17 DIAGNOSIS — M5416 Radiculopathy, lumbar region: Secondary | ICD-10-CM

## 2021-01-27 ENCOUNTER — Inpatient Hospital Stay: Admission: RE | Admit: 2021-01-27 | Payer: Medicaid Other | Source: Ambulatory Visit

## 2021-02-04 ENCOUNTER — Ambulatory Visit
Admission: RE | Admit: 2021-02-04 | Discharge: 2021-02-04 | Disposition: A | Discharge status: Home / Self Care | Payer: Medicaid Other | Source: Ambulatory Visit | Attending: Medical | Admitting: Medical

## 2021-02-04 ENCOUNTER — Other Ambulatory Visit: Payer: Self-pay

## 2021-02-04 DIAGNOSIS — M5416 Radiculopathy, lumbar region: Secondary | ICD-10-CM

## 2021-02-04 MED ORDER — IOPAMIDOL (ISOVUE-M 200) INJECTION 41%
1.0000 mL | Freq: Once | INTRAMUSCULAR | Status: AC
  Administered 2021-02-04: 1 mL via EPIDURAL

## 2021-02-04 MED ORDER — METHYLPREDNISOLONE ACETATE 40 MG/ML INJ SUSP (RADIOLOG
80.0000 mg | Freq: Once | INTRAMUSCULAR | Status: AC
  Administered 2021-02-04: 80 mg via EPIDURAL

## 2021-02-04 NOTE — Discharge Instructions (Signed)
Post Procedure Spinal Discharge Instruction Sheet  1. You may resume a regular diet and any medications that you routinely take (including pain medications).  2. No driving day of procedure.  3. Light activity throughout the rest of the day.  Do not do any strenuous work, exercise, bending or lifting.  The day following the procedure, you can resume normal physical activity but you should refrain from exercising or physical therapy for at least three days thereafter.   Common Side Effects:   Headaches- take your usual medications as directed by your physician.  Increase your fluid intake.  Caffeinated beverages may be helpful.  Lie flat in bed until your headache resolves.   Restlessness or inability to sleep- you may have trouble sleeping for the next few days.  Ask your referring physician if you need any medication for sleep.   Facial flushing or redness- should subside within a few days.   Increased pain- a temporary increase in pain a day or two following your procedure is not unusual.  Take your pain medication as prescribed by your referring physician.   Leg cramps  Please contact our office at [phone] for the following symptoms:  Fever greater than 100 degrees.  Headaches unresolved with medication after 2-3 days.  Increased swelling, pain, or redness at injection site.   Thank you for visiting [facility] today.  

## 2021-03-14 ENCOUNTER — Other Ambulatory Visit: Payer: Self-pay

## 2021-03-14 ENCOUNTER — Ambulatory Visit (INDEPENDENT_AMBULATORY_CARE_PROVIDER_SITE_OTHER): Payer: Medicaid Other | Admitting: Medical

## 2021-03-14 VITALS — BP 119/90 | HR 60 | Resp 16 | Ht <= 58 in | Wt 157.6 lb

## 2021-03-14 DIAGNOSIS — R350 Frequency of micturition: Secondary | ICD-10-CM | POA: Diagnosis not present

## 2021-03-14 DIAGNOSIS — Z833 Family history of diabetes mellitus: Secondary | ICD-10-CM | POA: Diagnosis not present

## 2021-03-14 DIAGNOSIS — I10 Essential (primary) hypertension: Secondary | ICD-10-CM | POA: Diagnosis not present

## 2021-03-14 DIAGNOSIS — M25511 Pain in right shoulder: Secondary | ICD-10-CM

## 2021-03-14 DIAGNOSIS — M5440 Lumbago with sciatica, unspecified side: Secondary | ICD-10-CM

## 2021-03-14 DIAGNOSIS — G43809 Other migraine, not intractable, without status migrainosus: Secondary | ICD-10-CM

## 2021-03-14 DIAGNOSIS — G8929 Other chronic pain: Secondary | ICD-10-CM

## 2021-03-14 LAB — COMPREHENSIVE METABOLIC PANEL
ALT: 11 U/L (ref 0–35)
AST: 16 U/L (ref 0–37)
Albumin: 4.5 g/dL (ref 3.5–5.2)
Alkaline Phosphatase: 73 U/L (ref 39–117)
BUN: 10 mg/dL (ref 6–23)
CO2: 29 mEq/L (ref 19–32)
Calcium: 9.6 mg/dL (ref 8.4–10.5)
Chloride: 103 mEq/L (ref 96–112)
Creatinine, Ser: 0.72 mg/dL (ref 0.40–1.20)
GFR: 100.67 mL/min (ref >60.00)
Glucose, Bld: 75 mg/dL (ref 70–99)
Potassium: 4 mEq/L (ref 3.5–5.1)
Sodium: 139 mEq/L (ref 135–145)
Total Bilirubin: 0.4 mg/dL (ref 0.2–1.2)
Total Protein: 7.5 g/dL (ref 6.0–8.3)

## 2021-03-14 LAB — CBC WITH DIFFERENTIAL/PLATELET
Basophils Absolute: 0 10*3/uL (ref 0.0–0.1)
Basophils Relative: 0.7 % (ref 0.0–3.0)
Eosinophils Absolute: 0.1 10*3/uL (ref 0.0–0.7)
Eosinophils Relative: 1.5 % (ref 0.0–5.0)
HCT: 40.4 % (ref 36.0–46.0)
Hemoglobin: 13.3 g/dL (ref 12.0–15.0)
Lymphocytes Relative: 42 % (ref 12.0–46.0)
Lymphs Abs: 2.7 10*3/uL (ref 0.7–4.0)
MCHC: 32.8 g/dL (ref 30.0–36.0)
MCV: 82.4 fl (ref 78.0–100.0)
Monocytes Absolute: 0.4 10*3/uL (ref 0.1–1.0)
Monocytes Relative: 6.1 % (ref 3.0–12.0)
Neutro Abs: 3.1 10*3/uL (ref 1.4–7.7)
Neutrophils Relative %: 49.7 % (ref 43.0–77.0)
Platelets: 278 10*3/uL (ref 150.0–400.0)
RBC: 4.91 Mil/uL (ref 3.87–5.11)
RDW: 14.2 % (ref 11.5–15.5)
WBC: 6.3 10*3/uL (ref 4.0–10.5)

## 2021-03-14 LAB — POC URINALSYSI DIPSTICK (AUTOMATED)
Bilirubin, UA: NEGATIVE
Blood, UA: POSITIVE
Glucose, UA: NEGATIVE
Ketones, UA: NEGATIVE
Nitrite, UA: NEGATIVE
Protein, UA: POSITIVE — AB
Spec Grav, UA: 1.015 (ref 1.010–1.025)
Urobilinogen, UA: 0.2 E.U./dL
pH, UA: 6 (ref 5.0–8.0)

## 2021-03-14 LAB — HEMOGLOBIN A1C: Hgb A1c MFr Bld: 5.4 % (ref 4.6–6.5)

## 2021-03-14 MED ORDER — ONDANSETRON 8 MG PO TBDP: ORAL_TABLET | ORAL | 0 refills | Status: DC

## 2021-03-14 MED ORDER — ALBUTEROL SULFATE HFA 108 (90 BASE) MCG/ACT IN AERS: INHALATION_SPRAY | RESPIRATORY_TRACT | 0 refills | Status: AC

## 2021-03-14 MED ORDER — BUTALBITAL-ASPIRIN-CAFFEINE 50-325-40 MG PO CAPS: 1.0000 | ORAL_CAPSULE | Freq: Four times a day (QID) | ORAL | 0 refills | Status: DC | PRN

## 2021-03-14 MED ORDER — CEPHALEXIN 500 MG PO CAPS: 500.0000 mg | ORAL_CAPSULE | Freq: Two times a day (BID) | ORAL | 0 refills | Status: DC

## 2021-03-14 MED ORDER — AMLODIPINE BESYLATE 10 MG PO TABS: ORAL_TABLET | ORAL | 0 refills | Status: DC

## 2021-03-14 MED ORDER — CYCLOBENZAPRINE HCL 10 MG PO TABS: ORAL_TABLET | ORAL | 1 refills | Status: DC

## 2021-03-14 MED ORDER — SUMATRIPTAN SUCCINATE 50 MG PO TABS: ORAL_TABLET | ORAL | 0 refills | Status: DC

## 2021-03-14 MED ORDER — MELOXICAM 15 MG PO TABS: ORAL_TABLET | ORAL | 0 refills | Status: DC

## 2021-03-14 NOTE — Patient Instructions (Addendum)
History of hypertension and blood pressure well controlled today.  I will get metabolic panel today and have him continue amlodipine.  Recent frequent urination.  Will get UA, urine culture and A1c since you do have family history of diabetes.  Recommend low sugar diet and regular exercise. Keflex antibiotic pending culture. Infection cells and blood on UA..  Right shoulder pain with reduced range of motion for 2 years.  Placed referral to sports medicine.  History of migraine headaches in the past and most recently occipital headache 2 days ago with some neck pain.  Probable combination migraine and tension headache type features recently.  Note recent high-level stress.  Refilled your Imitrex, butalbital and Zofran.  Normal neurologic exam today.  No current headache today.  Chronic back pain recently well controlled as he did get epidural injection.  Refilling your meloxicam and Flexeril to use of back pain with flares.  History of asthma and stable.  Refilling your albuterol to use as needed.  Follow up date to be determined after lab review.  Sooner if needed.

## 2021-03-14 NOTE — Progress Notes (Signed)
Subjective:    Patient ID: Phyllis Adams, female    DOB: 1976/03/17, 45 y.o.   MRN: [id number]  HPI  Pt in for follow up  Pt tells me recent frequent urination over past week. No pain on urination. Pt states feels excessive thirst but not hungry. Pt sugars level have not been high on review of past labs.   Pt states he back pain recently improved after she got her epidural. Pt has meloxicam to use if needed as well as used flexeril in the past.   Hx of htn- on amlodipine 5 10 mg daily.  Pt has history of migraine in the past. Pt is out of her imitrex, bultalbital and zofran. Pt had been out of meds for 2-3 months. Last ha was 2 days ago. Pt states some nause and she feels like migraine. But some occipital region headache. A lot of stress, father of her children passed aware/murdered and work stress.  Rt shoulder pain and reduced rom for years.     Review of Systems  Constitutional:  Negative for chills, fatigue and fever.  Respiratory:  Negative for cough, chest tightness and wheezing.   Cardiovascular:  Negative for chest pain and palpitations.  Gastrointestinal:  Negative for abdominal distention and abdominal pain.  Endocrine: Positive for polyuria. Negative for polydipsia.  Genitourinary:  Positive for frequency. Negative for flank pain, hematuria, pelvic pain and urgency.  Musculoskeletal:  Negative for back pain.  Skin:  Negative for rash.  Neurological:  Negative for dizziness, seizures, syncope, weakness and headaches.       No ha now but last ha 2 days ago.  Hematological:  Negative for adenopathy. Does not bruise/bleed easily.  Psychiatric/Behavioral:  Negative for confusion. The patient is not nervous/anxious.     Past Medical History:  Diagnosis Date   Depression    Femur fracture (HCC) 2006   Hypertension    Migraine    Mood disorder (HCC)      Social History   Socioeconomic History   Marital status: Single    Spouse name: Not on file   Number of  children: Not on file   Years of education: Not on file   Highest education level: Not on file  Occupational History   Not on file  Tobacco Use   Smoking status: Every Day    Packs/day: 0.00    Pack years: 0.00    Types: Cigars, Cigarettes   Smokeless tobacco: Never   Tobacco comments:    3 cigars with marijuiana  Vaping Use   Vaping Use: Never used  Substance and Sexual Activity   Alcohol use: No   Drug use: Yes    Types: Marijuana   Sexual activity: Yes    Birth control/protection: Other-see comments, Surgical  Other Topics Concern   Not on file  Social History Narrative   Not on file   Social Determinants of Health   Financial Resource Strain: Not on file  Food Insecurity: Not on file  Transportation Needs: Not on file  Physical Activity: Not on file  Stress: Not on file  Social Connections: Not on file  Intimate Partner Violence: Not on file    Past Surgical History:  Procedure Laterality Date   APPENDECTOMY     CHOLECYSTECTOMY     FEMUR FRACTURE SURGERY Right 2006   FEMUR IM ROD REMOVAL Right 03/11/2016   TUBAL LIGATION      Family History  Problem Relation Age of Onset   Hypertension Mother  Hypertension Maternal Aunt    Hypertension Paternal Uncle    Hypertension Maternal Grandmother    Hypertension Maternal Grandfather     Allergies  Allergen Reactions   Tramadol Shortness Of Breath and Nausea And Vomiting   Hydrocodone-Acetaminophen Other (See Comments)    Sweats, heavy breathing   Acetaminophen-Codeine Nausea Only    Current Outpatient Medications on File Prior to Visit  Medication Sig Dispense Refill   acetaminophen (TYLENOL) 500 MG tablet Take 1,000 mg by mouth every 6 (six) hours as needed for mild pain or headache.     fluticasone (FLONASE) 50 MCG/ACT nasal spray Place 2 sprays into both nostrils daily. (Patient taking differently: Place 2 sprays into both nostrils daily as needed.) 16 g 1   No current facility-administered  medications on file prior to visit.    BP 119/90   Pulse 60   Resp 16   Ht 4\' 10"  (1.473 m)   Wt 157 lb 9.6 oz (71.5 kg)   SpO2 98%   BMI 32.94 kg/m       Objective:   Physical Exam  General Mental Status- Alert. General Appearance- Not in acute distress.   Skin General: Color- Normal Color. Moisture- Normal Moisture.  Neck Carotid Arteries- Normal color. Moisture- Normal Moisture. No carotid bruits. No JVD.  Chest and Lung Exam Auscultation: Breath Sounds:-Normal.  Cardiovascular Auscultation:Rythm- Regular. Murmurs & Other Heart Sounds:Auscultation of the heart reveals- No Murmurs.  Abdomen Inspection:-Inspeection Normal. Palpation/Percussion:Note:No mass. Palpation and Percussion of the abdomen reveal- Non Tender, Non Distended + BS, no rebound or guarding.   Neurologic Cranial Nerve exam:- CN III-XII intact(No nystagmus), symmetric smile. Drift Test:- No drift. Finger to Nose:- Normal/Intact Strength:- 5/5 equal and symmetric strength both upper and lower extremities.   Back - no cva tenderness.    Assessment & Plan:   History of hypertension and blood pressure well controlled today.  I will get metabolic panel today and have him continue amlodipine.  Recent frequent urination.  Will get UA, urine culture and A1c since you do have family history of diabetes.  Recommend low sugar diet and regular exercise.  Right shoulder pain with reduced range of motion for 2 years.  Placed referral to sports medicine.  History of migraine headaches in the past and most recently occipital headache 2 days ago with some neck pain.  Probable combination migraine and tension headache type features recently.  Note recent high-level stress.  Refilled your Imitrex, butalbital and Zofran.  Normal neurologic exam today.  No current headache today.  Chronic back pain recently well controlled as he did get epidural injection.  Refilling your meloxicam and Flexeril to use of back pain  with flares.  History of asthma and stable.  Refilling your albuterol to use as needed.  Follow up date to be determined after lab review.  Sooner if needed.  , PA-C

## 2021-03-16 LAB — URINE CULTURE
MICRO NUMBER:: 12096947
SPECIMEN QUALITY:: ADEQUATE

## 2021-03-17 ENCOUNTER — Telehealth: Payer: Self-pay | Admitting: Medical

## 2021-03-17 MED ORDER — SULFAMETHOXAZOLE-TRIMETHOPRIM 800-160 MG PO TABS: 1.0000 | ORAL_TABLET | Freq: Two times a day (BID) | ORAL | 0 refills | Status: DC

## 2021-03-17 NOTE — Telephone Encounter (Signed)
Rx bactrim sent to pharmacy. 

## 2021-03-20 ENCOUNTER — Ambulatory Visit: Payer: Medicaid Other | Admitting: Family Medicine

## 2021-03-20 NOTE — Progress Notes (Deleted)
  Phyllis Adams - 45 y.o. female MRN [id number]  Date of birth: Oct 26, 1975  SUBJECTIVE:  Including CC & ROS.  No chief complaint on file.   Phyllis Adams is a 45 y.o. female that is  ***.  ***   Review of Systems See HPI   HISTORY: Past Medical, Surgical, Social, and Family History Reviewed & Updated per EMR.   Pertinent Historical Findings include:  Past Medical History:  Diagnosis Date   Depression    Femur fracture (HCC) 2006   Hypertension    Migraine    Mood disorder (HCC)     Past Surgical History:  Procedure Laterality Date   APPENDECTOMY     CHOLECYSTECTOMY     FEMUR FRACTURE SURGERY Right 2006   FEMUR IM ROD REMOVAL Right 03/11/2016   TUBAL LIGATION      Family History  Problem Relation Age of Onset   Hypertension Mother    Hypertension Maternal Aunt    Hypertension Paternal Uncle    Hypertension Maternal Grandmother    Hypertension Maternal Grandfather     Social History   Socioeconomic History   Marital status: Single    Spouse name: Not on file   Number of children: Not on file   Years of education: Not on file   Highest education level: Not on file  Occupational History   Not on file  Tobacco Use   Smoking status: Every Day    Packs/day: 0.00    Types: Cigars, Cigarettes   Smokeless tobacco: Never   Tobacco comments:    3 cigars with marijuiana  Vaping Use   Vaping Use: Never used  Substance and Sexual Activity   Alcohol use: No   Drug use: Yes    Types: Marijuana   Sexual activity: Yes    Birth control/protection: Other-see comments, Surgical  Other Topics Concern   Not on file  Social History Narrative   Not on file   Social Determinants of Health   Financial Resource Strain: Not on file  Food Insecurity: Not on file  Transportation Needs: Not on file  Physical Activity: Not on file  Stress: Not on file  Social Connections: Not on file  Intimate Partner Violence: Not on file     PHYSICAL EXAM:  VS: There were no  vitals taken for this visit. Physical Exam Gen: NAD, alert, cooperative with exam, well-appearing MSK:  ***      ASSESSMENT & PLAN:   No problem-specific Assessment & Plan notes found for this encounter.

## 2021-03-25 ENCOUNTER — Encounter: Payer: Self-pay | Admitting: Medical

## 2021-03-26 ENCOUNTER — Encounter: Payer: Self-pay | Admitting: Family Medicine

## 2021-03-26 ENCOUNTER — Ambulatory Visit (INDEPENDENT_AMBULATORY_CARE_PROVIDER_SITE_OTHER): Payer: Self-pay | Admitting: Family Medicine

## 2021-03-26 ENCOUNTER — Other Ambulatory Visit: Payer: Self-pay

## 2021-03-26 ENCOUNTER — Ambulatory Visit (INDEPENDENT_AMBULATORY_CARE_PROVIDER_SITE_OTHER): Payer: Medicaid Other | Admitting: Family Medicine

## 2021-03-26 VITALS — BP 140/92 | Ht <= 58 in | Wt 157.0 lb

## 2021-03-26 VITALS — BP 120/84 | HR 59 | Temp 98.1°F | Ht <= 58 in | Wt 162.0 lb

## 2021-03-26 DIAGNOSIS — M7501 Adhesive capsulitis of right shoulder: Secondary | ICD-10-CM

## 2021-03-26 DIAGNOSIS — M5441 Lumbago with sciatica, right side: Secondary | ICD-10-CM | POA: Diagnosis not present

## 2021-03-26 HISTORY — DX: Adhesive capsulitis of right shoulder: M75.01

## 2021-03-26 MED ORDER — TIZANIDINE HCL 4 MG PO TABS: 4.0000 mg | ORAL_TABLET | Freq: Four times a day (QID) | ORAL | 0 refills | Status: DC | PRN

## 2021-03-26 MED ORDER — KETOROLAC TROMETHAMINE 60 MG/2ML IM SOLN
60.0000 mg | Freq: Once | INTRAMUSCULAR | Status: AC
  Administered 2021-03-26: 60 mg via INTRAMUSCULAR

## 2021-03-26 NOTE — Patient Instructions (Signed)
Nice to meet you Please try heat before exercise and ice after  Please try the rub on medicine  Please try the exercises   Please send me a message in MyChart with any questions or updates.  Please see me back in 4 weeks.   --Dr. Jordan Likes

## 2021-03-26 NOTE — Patient Instructions (Addendum)

## 2021-03-26 NOTE — Assessment & Plan Note (Signed)
Has limited range of motion and symptoms seem most consistent with frozen shoulder.  Symptoms of impingement are likely secondary -Counseled on home exercise therapy and supportive care. - pennsaid samples -Could consider injection, physical therapy or further imaging.

## 2021-03-26 NOTE — Progress Notes (Signed)
Musculoskeletal Exam  Patient: Phyllis Adams DOB: Jun 03, 1976  DOS: 03/26/2021  SUBJECTIVE:  Chief Complaint:   Chief Complaint  Patient presents with   Leg Pain    Right leg pain     Phyllis Adams is a 45 y.o.  female for evaluation and treatment of RLE pain.   Onset:  1 week ago started getting worse; chronic issue. No inj or change in activity.  Location: RLE lateral/posterior, back Character:  aching  Progression of issue:  has worsened Associated symptoms: radiating down LE Denies: Redness, swelling, bruising, bowel/bladder  Treatment: to date has been prescription NSAIDS, Flexeril and received an epidural that helped for around a week.   Neurovascular symptoms: no  Past Medical History:  Diagnosis Date   Depression    Femur fracture (HCC) 2006   Hypertension    Migraine    Mood disorder (HCC)     Objective: VITAL SIGNS: BP 120/84   Pulse (!) 59   Temp 98.1 F (36.7 C) (Oral)   Ht 4\' 10"  (1.473 m)   Wt 162 lb (73.5 kg)   SpO2 98%   BMI 33.86 kg/m  Constitutional: Well formed, well developed. No acute distress. Thorax & Lungs: No accessory muscle use Musculoskeletal: low back.   Tenderness to palpation: yes over R SI jt, R lumbar ES group and parasp msc Deformity: no Ecchymosis: no Tests positive: none Tests negative: straight leg Neurologic: Normal sensory function. No focal deficits noted. DTR's equal and symmetric in LE's. No clonus. 5/5 strength b/l in LE's.  Psychiatric: Normal mood. Age appropriate judgment and insight. Alert & oriented x 3.    Assessment:  Acute right-sided low back pain with right-sided sciatica - Plan: tiZANidine (ZANAFLEX) 4 MG tablet  Plan: Exacerbation of chronic issue. Stretches/exercises, heat, ice, Tylenol. Flexeril makes her drowsy. She has appt in process for another epidural. Does well with Toradol injections, will do today. No mobic for remainder of day. Trial Zanaflex to see if it is less sedating than Flexeril.   F/u prn. The patient voiced understanding and agreement to the plan.   [place], DO 03/26/21  12:59 PM

## 2021-03-26 NOTE — Addendum Note (Signed)
Addended by: Gwenevere Abbot on: 03/26/2021 09:50 AM   Modules accepted: Orders

## 2021-03-26 NOTE — Progress Notes (Signed)
Medication Samples have been provided to the patient.  Drug name: Pennsaid       Strength: 2%        Qty: 2 boxes LOT: [id number]  Exp.Date: 03/2022  Dosing instructions: use a pea size amount  The patient has been instructed regarding the correct time, dose, and frequency of taking this medication, including desired effects and most common side effects.   Lanier Prude 11:49 AM 03/26/2021

## 2021-03-26 NOTE — Progress Notes (Signed)
  Phyllis Adams - 44 y.o. female MRN [id number]  Date of birth: 1976-03-25  SUBJECTIVE:  Including CC & ROS.  No chief complaint on file.   Phyllis Adams is a 45 y.o. female that is presenting with acute on chronic right shoulder pain.  She reports having an injury a couple years ago.  Since that time she has had issues with the shoulder.  Has trouble with range of motion.   Review of Systems See HPI   HISTORY: Past Medical, Surgical, Social, and Family History Reviewed & Updated per EMR.   Pertinent Historical Findings include:  Past Medical History:  Diagnosis Date   Depression    Femur fracture (HCC) 2006   Hypertension    Migraine    Mood disorder (HCC)     Past Surgical History:  Procedure Laterality Date   APPENDECTOMY     CHOLECYSTECTOMY     FEMUR FRACTURE SURGERY Right 2006   FEMUR IM ROD REMOVAL Right 03/11/2016   TUBAL LIGATION      Family History  Problem Relation Age of Onset   Hypertension Mother    Hypertension Maternal Aunt    Hypertension Paternal Uncle    Hypertension Maternal Grandmother    Hypertension Maternal Grandfather     Social History   Socioeconomic History   Marital status: Single    Spouse name: Not on file   Number of children: Not on file   Years of education: Not on file   Highest education level: Not on file  Occupational History   Not on file  Tobacco Use   Smoking status: Every Day    Packs/day: 0.00    Types: Cigars, Cigarettes   Smokeless tobacco: Never   Tobacco comments:    3 cigars with marijuiana  Vaping Use   Vaping Use: Never used  Substance and Sexual Activity   Alcohol use: No   Drug use: Yes    Types: Marijuana   Sexual activity: Yes    Birth control/protection: Other-see comments, Surgical  Other Topics Concern   Not on file  Social History Narrative   Not on file   Social Determinants of Health   Financial Resource Strain: Not on file  Food Insecurity: Not on file  Transportation Needs: Not on  file  Physical Activity: Not on file  Stress: Not on file  Social Connections: Not on file  Intimate Partner Violence: Not on file     PHYSICAL EXAM:  VS: BP (!) 140/92 (BP Location: Left Arm, Patient Position: Sitting, Cuff Size: Large)   Ht 4\' 10"  (1.473 m)   Wt 157 lb (71.2 kg)   BMI 32.81 kg/m  Physical Exam Gen: NAD, alert, cooperative with exam, well-appearing MSK:  Right shoulder: Limited range of motion in external rotation. Limited abduction. Pain with empty can testing. Neurovascular intact     ASSESSMENT & PLAN:   Adhesive capsulitis of right shoulder Has limited range of motion and symptoms seem most consistent with frozen shoulder.  Symptoms of impingement are likely secondary -Counseled on home exercise therapy and supportive care. - pennsaid samples -Could consider injection, physical therapy or further imaging.

## 2021-03-26 NOTE — Addendum Note (Signed)
Addended by: Scharlene Gloss B on: 03/26/2021 01:12 PM   Modules accepted: Orders

## 2021-04-01 ENCOUNTER — Other Ambulatory Visit: Payer: Self-pay | Admitting: Medical

## 2021-04-01 DIAGNOSIS — M5440 Lumbago with sciatica, unspecified side: Secondary | ICD-10-CM

## 2021-04-01 DIAGNOSIS — G8929 Other chronic pain: Secondary | ICD-10-CM

## 2021-04-04 ENCOUNTER — Ambulatory Visit
Admission: RE | Admit: 2021-04-04 | Discharge: 2021-04-04 | Disposition: A | Discharge status: Home / Self Care | Payer: Medicaid Other | Source: Ambulatory Visit | Attending: Medical | Admitting: Medical

## 2021-04-04 ENCOUNTER — Inpatient Hospital Stay: Admission: RE | Admit: 2021-04-04 | Payer: Medicaid Other | Source: Ambulatory Visit

## 2021-04-04 ENCOUNTER — Other Ambulatory Visit: Payer: Self-pay

## 2021-04-04 DIAGNOSIS — M5440 Lumbago with sciatica, unspecified side: Secondary | ICD-10-CM

## 2021-04-04 DIAGNOSIS — G8929 Other chronic pain: Secondary | ICD-10-CM

## 2021-04-04 MED ORDER — IOPAMIDOL (ISOVUE-M 200) INJECTION 41%
1.0000 mL | Freq: Once | INTRAMUSCULAR | Status: AC
  Administered 2021-04-04: 1 mL via EPIDURAL

## 2021-04-04 MED ORDER — METHYLPREDNISOLONE ACETATE 40 MG/ML INJ SUSP (RADIOLOG
80.0000 mg | Freq: Once | INTRAMUSCULAR | Status: AC
  Administered 2021-04-04: 80 mg via EPIDURAL

## 2021-04-04 NOTE — Discharge Instructions (Signed)

## 2021-05-02 ENCOUNTER — Ambulatory Visit: Payer: Medicaid Other | Admitting: Family Medicine

## 2021-05-29 ENCOUNTER — Telehealth: Payer: Self-pay | Admitting: Medical

## 2021-05-29 NOTE — Telephone Encounter (Signed)
Pt. Is requesting a TB shot for her new job. I explained the process of getting labs placed for TB shot before appt being made and pt hung up after I explained process multiple times.

## 2021-05-30 NOTE — Telephone Encounter (Signed)
Spoke with pt , and she told me she made an appointment for Monday to get a CPE and tb .

## 2021-06-02 ENCOUNTER — Ambulatory Visit: Payer: Medicaid Other | Admitting: Medical

## 2021-06-02 ENCOUNTER — Other Ambulatory Visit: Payer: Self-pay

## 2021-06-02 ENCOUNTER — Ambulatory Visit (INDEPENDENT_AMBULATORY_CARE_PROVIDER_SITE_OTHER): Payer: Medicaid Other | Admitting: Medical

## 2021-06-02 VITALS — BP 138/70 | HR 75 | Temp 98.5°F | Ht <= 58 in | Wt 165.0 lb

## 2021-06-02 DIAGNOSIS — Z0000 Encounter for general adult medical examination without abnormal findings: Secondary | ICD-10-CM

## 2021-06-02 DIAGNOSIS — G8929 Other chronic pain: Secondary | ICD-10-CM

## 2021-06-02 DIAGNOSIS — M5441 Lumbago with sciatica, right side: Secondary | ICD-10-CM

## 2021-06-02 DIAGNOSIS — Z1231 Encounter for screening mammogram for malignant neoplasm of breast: Secondary | ICD-10-CM | POA: Diagnosis not present

## 2021-06-02 DIAGNOSIS — Z1211 Encounter for screening for malignant neoplasm of colon: Secondary | ICD-10-CM

## 2021-06-02 DIAGNOSIS — Z124 Encounter for screening for malignant neoplasm of cervix: Secondary | ICD-10-CM

## 2021-06-02 DIAGNOSIS — Z111 Encounter for screening for respiratory tuberculosis: Secondary | ICD-10-CM | POA: Diagnosis not present

## 2021-06-02 DIAGNOSIS — M5440 Lumbago with sciatica, unspecified side: Secondary | ICD-10-CM

## 2021-06-02 DIAGNOSIS — Z113 Encounter for screening for infections with a predominantly sexual mode of transmission: Secondary | ICD-10-CM | POA: Diagnosis not present

## 2021-06-02 LAB — COMPREHENSIVE METABOLIC PANEL
ALT: 7 U/L (ref 0–35)
AST: 11 U/L (ref 0–37)
Albumin: 4.1 g/dL (ref 3.5–5.2)
Alkaline Phosphatase: 64 U/L (ref 39–117)
BUN: 8 mg/dL (ref 6–23)
CO2: 28 mEq/L (ref 19–32)
Calcium: 9.4 mg/dL (ref 8.4–10.5)
Chloride: 105 mEq/L (ref 96–112)
Creatinine, Ser: 0.74 mg/dL (ref 0.40–1.20)
GFR: 97.27 mL/min (ref >60.00)
Glucose, Bld: 75 mg/dL (ref 70–99)
Potassium: 3.9 mEq/L (ref 3.5–5.1)
Sodium: 139 mEq/L (ref 135–145)
Total Bilirubin: 0.3 mg/dL (ref 0.2–1.2)
Total Protein: 7 g/dL (ref 6.0–8.3)

## 2021-06-02 LAB — LIPID PANEL
Cholesterol: 126 mg/dL (ref 0–200)
HDL: 44.8 mg/dL (ref >39.00)
LDL Cholesterol: 71 mg/dL (ref 0–99)
NonHDL: 81.09
Total CHOL/HDL Ratio: 3
Triglycerides: 48 mg/dL (ref 0.0–149.0)
VLDL: 9.6 mg/dL (ref 0.0–40.0)

## 2021-06-02 LAB — CBC WITH DIFFERENTIAL/PLATELET
Basophils Absolute: 0 10*3/uL (ref 0.0–0.1)
Basophils Relative: 0.3 % (ref 0.0–3.0)
Eosinophils Absolute: 0.1 10*3/uL (ref 0.0–0.7)
Eosinophils Relative: 1 % (ref 0.0–5.0)
HCT: 38.9 % (ref 36.0–46.0)
Hemoglobin: 12.3 g/dL (ref 12.0–15.0)
Lymphocytes Relative: 37.9 % (ref 12.0–46.0)
Lymphs Abs: 2.9 10*3/uL (ref 0.7–4.0)
MCHC: 31.6 g/dL (ref 30.0–36.0)
MCV: 83.7 fl (ref 78.0–100.0)
Monocytes Absolute: 0.5 10*3/uL (ref 0.1–1.0)
Monocytes Relative: 6.1 % (ref 3.0–12.0)
Neutro Abs: 4.2 10*3/uL (ref 1.4–7.7)
Neutrophils Relative %: 54.7 % (ref 43.0–77.0)
Platelets: 243 10*3/uL (ref 150.0–400.0)
RBC: 4.65 Mil/uL (ref 3.87–5.11)
RDW: 14.5 % (ref 11.5–15.5)
WBC: 7.7 10*3/uL (ref 4.0–10.5)

## 2021-06-02 NOTE — Addendum Note (Signed)
Addended by: Maximino Sarin on: 06/02/2021 10:55 AM   Modules accepted: Orders

## 2021-06-02 NOTE — Patient Instructions (Addendum)
For you wellness exam today I have ordered cbc, cmp,  lipid panel and hiv  Vaccine declined today. Explained if change mind can schedule nurse visit separate day. For covid vaccine we have down stairs.  Recommend exercise and healthy diet.  We will let you know lab results as they come in.  Follow up date appointment will be determined after lab review.    Refer for screening colonoscopy, pap smear and mammogram.  Added on tb blood test screening since requirement for work. Filled out form.   Preventive Care 46-48 Years Old, Female Preventive care refers to lifestyle choices and visits with your health care provider that can promote health and wellness. This includes: A yearly physical exam. This is also called an annual wellness visit. Regular dental and eye exams. Immunizations. Screening for certain conditions. Healthy lifestyle choices, such as: Eating a healthy diet. Getting regular exercise. Not using drugs or products that contain nicotine and tobacco. Limiting alcohol use. What can I expect for my preventive care visit? Physical exam Your health care provider will check your: Height and weight. These may be used to calculate your BMI (body mass index). BMI is a measurement that tells if you are at a healthy weight. Heart rate and blood pressure. Body temperature. Skin for abnormal spots. Counseling Your health care provider may ask you questions about your: Past medical problems. Family's medical history. Alcohol, tobacco, and drug use. Emotional well-being. Home life and relationship well-being. Sexual activity. Diet, exercise, and sleep habits. Work and work Statistician. Access to firearms. Method of birth control. Menstrual cycle. Pregnancy history. What immunizations do I need? Vaccines are usually given at various ages, according to a schedule. Your health care provider will recommend vaccines for you based on your age, medical history, and lifestyle or  other factors, such as travel or where you work. What tests do I need? Blood tests Lipid and cholesterol levels. These may be checked every 5 years, or more often if you are over 27 years old. Hepatitis C test. Hepatitis B test. Screening Lung cancer screening. You may have this screening every year starting at age 46 if you have a 30-pack-year history of smoking and currently smoke or have quit within the past 15 years. Colorectal cancer screening. All adults should have this screening starting at age 46 and continuing until age 49. Your health care provider may recommend screening at age 19 if you are at increased risk. You will have tests every 1-10 years, depending on your results and the type of screening test. Diabetes screening. This is done by checking your blood sugar (glucose) after you have not eaten for a while (fasting). You may have this done every 1-3 years. Mammogram. This may be done every 1-2 years. Talk with your health care provider about when you should start having regular mammograms. This may depend on whether you have a family history of breast cancer. BRCA-related cancer screening. This may be done if you have a family history of breast, ovarian, tubal, or peritoneal cancers. Pelvic exam and Pap test. This may be done every 3 years starting at age 46. Starting at age 11, this may be done every 5 years if you have a Pap test in combination with an HPV test. Other tests STD (sexually transmitted disease) testing, if you are at risk. Bone density scan. This is done to screen for osteoporosis. You may have this scan if you are at high risk for osteoporosis. Talk with your health care provider about  your test results, treatment options, and if necessary, the need for more tests. Follow these instructions at home: Eating and drinking  Eat a diet that includes fresh fruits and vegetables, whole grains, lean protein, and low-fat dairy products. Take vitamin and mineral  supplements as recommended by your health care provider. Do not drink alcohol if: Your health care provider tells you not to drink. You are pregnant, may be pregnant, or are planning to become pregnant. If you drink alcohol: Limit how much you have to 0-1 drink a day. Be aware of how much alcohol is in your drink. In the [place] one drink equals one 12 oz bottle of beer (355 mL), one 5 oz glass of wine (148 mL), or one 1 oz glass of hard liquor (44 mL). Lifestyle Take daily care of your teeth and gums. Brush your teeth every morning and night with fluoride toothpaste. Floss one time each day. Stay active. Exercise for at least 30 minutes 5 or more days each week. Do not use any products that contain nicotine or tobacco, such as cigarettes, e-cigarettes, and chewing tobacco. If you need help quitting, ask your health care provider. Do not use drugs. If you are sexually active, practice safe sex. Use a condom or other form of protection to prevent STIs (sexually transmitted infections). If you do not wish to become pregnant, use a form of birth control. If you plan to become pregnant, see your health care provider for a prepregnancy visit. If told by your health care provider, take low-dose aspirin daily starting at age 46. Find healthy ways to cope with stress, such as: Meditation, yoga, or listening to music. Journaling. Talking to a trusted person. Spending time with friends and family. Safety Always wear your seat belt while driving or riding in a vehicle. Do not drive: If you have been drinking alcohol. Do not ride with someone who has been drinking. When you are tired or distracted. While texting. Wear a helmet and other protective equipment during sports activities. If you have firearms in your house, make sure you follow all gun safety procedures. What's next? Visit your health care provider once a year for an annual wellness visit. Ask your health care provider how often you  should have your eyes and teeth checked. Stay up to date on all vaccines. This information is not intended to replace advice given to you by your health care provider. Make sure you discuss any questions you have with your health care provider. Document Revised: 11/01/2020 Document Reviewed: 05/05/2018 Elsevier Patient Education  2022 [employer].

## 2021-06-02 NOTE — Progress Notes (Signed)
Subjective:    Patient ID: Phyllis Adams, female    DOB: [birth year], 45 y.o.   MRN: [id number]  HPI  Pt in for follow cpe/wellness exam.  Pt is fasting. 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 Pt states diet improved/better. Avoiding fried foods and not eating pork.  Pt never had a mammogram.  No colonoscopy in the past.  Pt needs screening papsmear. More than 3 years since last. I don't see pap results in lab.  Pt declines flu vaccine   Pt declines covid vaccine.  Med form to fill out. Pt does not need to lift up/hold children extended time per her report. Emotionally well presently.   Review of Systems  Constitutional:  Negative for chills, fatigue and fever.  HENT:  Negative for congestion and drooling.   Respiratory:  Negative for cough, chest tightness, shortness of breath and wheezing.   Cardiovascular:  Negative for chest pain and palpitations.  Gastrointestinal:  Negative for abdominal distention, abdominal pain, blood in stool and constipation.  Genitourinary:  Negative for difficulty urinating, dysuria, enuresis and frequency.  Skin:  Negative for rash.  Psychiatric/Behavioral:  Negative for behavioral problems, dysphoric mood and sleep disturbance. The patient is not nervous/anxious.     Past Medical History:  Diagnosis Date   Depression    Femur fracture (HCC) 2006   Hypertension    Migraine    Mood disorder (HCC)      Social History   Socioeconomic History   Marital status: Single    Spouse name: Not on file   Number of children: Not on file   Years of education: Not on file   Highest education level: Not on file  Occupational History   Not on file  Tobacco Use   Smoking status: Every Day    Packs/day: 0.00    Types: Cigars, Cigarettes   Smokeless tobacco: Never   Tobacco comments:    3 cigars with marijuiana  Vaping Use   Vaping Use: Never used  Substance and Sexual Activity   Alcohol use: No   Drug use: Yes     Types: Marijuana   Sexual activity: Yes    Birth control/protection: Other-see comments, Surgical  Other Topics Concern   Not on file  Social History Narrative   Not on file   Social Determinants of Health   Financial Resource Strain: Not on file  Food Insecurity: Not on file  Transportation Needs: Not on file  Physical Activity: Not on file  Stress: Not on file  Social Connections: Not on file  Intimate Partner Violence: Not on file    Past Surgical History:  Procedure Laterality Date   APPENDECTOMY     CHOLECYSTECTOMY     FEMUR FRACTURE SURGERY Right 2006   FEMUR IM ROD REMOVAL Right 03/11/2016   TUBAL LIGATION      Family History  Problem Relation Age of Onset   Hypertension Mother    Hypertension Maternal Aunt    Hypertension Paternal Uncle    Hypertension Maternal Grandmother    Hypertension Maternal Grandfather     Allergies  Allergen Reactions   Tramadol Shortness Of Breath and Nausea And Vomiting   Hydrocodone-Acetaminophen Other (See Comments)    Sweats, heavy breathing   Acetaminophen-Codeine Nausea Only    Current Outpatient Medications on File Prior to Visit  Medication Sig Dispense Refill   acetaminophen (TYLENOL) 500 MG tablet Take 1,000 mg by mouth every 6 (six) hours as needed for  mild pain or headache.     albuterol (VENTOLIN HFA) 108 (90 Base) MCG/ACT inhaler INHALE 2 PUFFS INTO THE LUNGS EVERY 6 HOURS AS NEEDED FOR WHEEZING OR SHORTNESS OF BREATH 18 g 0   amLODipine (NORVASC) 10 MG tablet 1/2-1 tab po q day 90 tablet 0   butalbital-aspirin-caffeine (FIORINAL) 50-325-40 MG capsule Take 1 capsule by mouth every 6 (six) hours as needed for headache. 12 capsule 0   fluticasone (FLONASE) 50 MCG/ACT nasal spray Place 2 sprays into both nostrils daily. (Patient taking differently: Place 2 sprays into both nostrils daily as needed.) 16 g 1   meloxicam (MOBIC) 15 MG tablet 1 tab po q day 90 tablet 0   ondansetron (ZOFRAN-ODT) 8 MG disintegrating tablet  DISSOLVE 1 TABLET(8 MG) ON THE TONGUE EVERY 8 HOURS AS NEEDED FOR NAUSEA OR VOMITING 15 tablet 0   SUMAtriptan (IMITREX) 50 MG tablet May repeat in 2 hours if headache persists or recurs. 10 tablet 0   tiZANidine (ZANAFLEX) 4 MG tablet Take 1 tablet (4 mg total) by mouth every 6 (six) hours as needed for muscle spasms. 30 tablet 0   No current facility-administered medications on file prior to visit.    BP 138/70   Pulse 75   Temp 98.5 F (36.9 C)   Ht 4\' 10"  (1.473 m)   Wt 165 lb (74.8 kg)   SpO2 100%   BMI 34.49 kg/m       Objective:   Physical Exam  General Mental Status- Alert. General Appearance- Not in acute distress.   Skin General: Color- Normal Color. Moisture- Normal Moisture.  Neck Carotid Arteries- Normal color. Moisture- Normal Moisture. No carotid bruits. No JVD.  Chest and Lung Exam Auscultation: Breath Sounds:-Normal.  Cardiovascular Auscultation:Rythm- Regular. Murmurs & Other Heart Sounds:Auscultation of the heart reveals- No Murmurs.  Abdomen Inspection:-Inspeection Normal. Palpation/Percussion:Note:No mass. Palpation and Percussion of the abdomen reveal- Non Tender, Non Distended + BS, no rebound or guarding.  Neurologic Cranial Nerve exam:- CN III-XII intact(No nystagmus), symmetric smile. Strength:- 5/5 equal and symmetric strength both upper and lower extremities.       Assessment & Plan:   Patient Instructions  For you wellness exam today I have ordered cbc, cmp,  lipid panel and hiv  Vaccine declined today. Explained if change mind can shedule nurse visit separate day. For covid vaccine we have down stairs.  Recommend exercise and healthy diet.  We will let you know lab results as they come in.  Follow up date appointment will be determined after lab review.    Refer for screening colonoscopy, pap smear and mammogram.  Added on tb blood test screening  .  , PA-C

## 2021-06-03 ENCOUNTER — Telehealth (HOSPITAL_BASED_OUTPATIENT_CLINIC_OR_DEPARTMENT_OTHER): Payer: Self-pay

## 2021-06-03 LAB — HIV ANTIBODY (ROUTINE TESTING W REFLEX): HIV 1&2 Ab, 4th Generation: NONREACTIVE

## 2021-06-06 LAB — QUANTIFERON-TB GOLD PLUS
Mitogen-NIL: >10 IU/mL
NIL: 0.09 IU/mL
QuantiFERON-TB Gold Plus: NEGATIVE
TB1-NIL: 0.01 IU/mL
TB2-NIL: 0.01 IU/mL

## 2021-06-10 ENCOUNTER — Telehealth (HOSPITAL_BASED_OUTPATIENT_CLINIC_OR_DEPARTMENT_OTHER): Payer: Self-pay

## 2021-08-06 ENCOUNTER — Encounter: Payer: Self-pay | Admitting: Medical

## 2021-08-06 NOTE — Addendum Note (Signed)
Addended by: Gwenevere Abbot on: 08/06/2021 05:58 PM   Modules accepted: Orders

## 2021-08-14 ENCOUNTER — Encounter: Payer: Medicaid Other | Admitting: Obstetrics and Gynecology

## 2021-08-20 ENCOUNTER — Encounter: Payer: Self-pay | Admitting: Medical

## 2021-08-25 ENCOUNTER — Telehealth: Payer: Self-pay | Admitting: Medical

## 2021-08-25 ENCOUNTER — Ambulatory Visit (INDEPENDENT_AMBULATORY_CARE_PROVIDER_SITE_OTHER): Payer: Medicaid Other | Admitting: Medical

## 2021-08-25 ENCOUNTER — Other Ambulatory Visit: Payer: Self-pay | Admitting: Medical

## 2021-08-25 ENCOUNTER — Encounter: Payer: Self-pay | Admitting: Medical

## 2021-08-25 VITALS — BP 132/82 | HR 70 | Temp 98.1°F | Resp 16 | Ht <= 58 in | Wt 165.4 lb

## 2021-08-25 DIAGNOSIS — I10 Essential (primary) hypertension: Secondary | ICD-10-CM

## 2021-08-25 DIAGNOSIS — M5416 Radiculopathy, lumbar region: Secondary | ICD-10-CM | POA: Diagnosis not present

## 2021-08-25 DIAGNOSIS — M79604 Pain in right leg: Secondary | ICD-10-CM

## 2021-08-25 DIAGNOSIS — J0100 Acute maxillary sinusitis, unspecified: Secondary | ICD-10-CM | POA: Diagnosis not present

## 2021-08-25 MED ORDER — FLUTICASONE PROPIONATE 50 MCG/ACT NA SUSP: 2.0000 | Freq: Every day | NASAL | 1 refills | Status: DC

## 2021-08-25 MED ORDER — AZITHROMYCIN 250 MG PO TABS: ORAL_TABLET | ORAL | 0 refills | Status: AC

## 2021-08-25 MED ORDER — IBUPROFEN 800 MG PO TABS: 800.0000 mg | ORAL_TABLET | Freq: Three times a day (TID) | ORAL | 0 refills | Status: DC | PRN

## 2021-08-25 NOTE — Progress Notes (Signed)
Subjective:    Patient ID: Phyllis Adams, female    DOB: 1976-03-06, 45 y.o.   MRN: [id number]  HPI Pt in for follow up.   Pt has chronic back pain with pain radiating to her rt leg.  Referral not placed 08-06-2021.  "Hx of epidural injection for lower back pain with radicular features. L5-s1 displacement. Refer to [facility]. Pt request referral having recurrent back and leg pain. Evaluate and treat."  Sent message to [facility] to check.  Pt has been using ibuprofen 800 mg for the pain. She states it has helped her more than meloxicam recently.   She states not driving much so not in much pain. Also on christmas break for her work. So pain is presently controlled.  Pt recently had some nasal congestion and some sinus pressure 9-10 days ago. She states after a week when blows nose will see some colored mucus. No fever, no chills, no sweats or bodyaches.   Pt never tested for covid. Did not test for   Htn- bp controlled with amlodipine 10 mg daily.    Review of Systems  Constitutional:  Negative for chills, fatigue and fever.  HENT:  Positive for congestion, sinus pressure and sinus pain.   Respiratory:  Negative for cough, chest tightness, shortness of breath and wheezing.   Cardiovascular:  Negative for chest pain and palpitations.  Gastrointestinal:  Negative for abdominal pain.  Musculoskeletal:  Negative for back pain.  Skin:  Negative for rash.  Hematological:  Negative for adenopathy. Does not bruise/bleed easily.  Psychiatric/Behavioral:  Negative for behavioral problems and confusion.     Past Medical History:  Diagnosis Date   Depression    Femur fracture (HCC) 2006   Hypertension    Migraine    Mood disorder (HCC)      Social History   Socioeconomic History   Marital status: Single    Spouse name: Not on file   Number of children: Not on file   Years of education: Not on file   Highest education level: Not on file  Occupational History   Not  on file  Tobacco Use   Smoking status: Every Day    Packs/day: 0.00    Types: Cigars, Cigarettes   Smokeless tobacco: Never   Tobacco comments:    3 cigars with marijuiana  Vaping Use   Vaping Use: Never used  Substance and Sexual Activity   Alcohol use: No   Drug use: Yes    Types: Marijuana   Sexual activity: Yes    Birth control/protection: Other-see comments, Surgical  Other Topics Concern   Not on file  Social History Narrative   Not on file   Social Determinants of Health   Financial Resource Strain: Not on file  Food Insecurity: Not on file  Transportation Needs: Not on file  Physical Activity: Not on file  Stress: Not on file  Social Connections: Not on file  Intimate Partner Violence: Not on file    Past Surgical History:  Procedure Laterality Date   APPENDECTOMY     CHOLECYSTECTOMY     FEMUR FRACTURE SURGERY Right 2006   FEMUR IM ROD REMOVAL Right 03/11/2016   TUBAL LIGATION      Family History  Problem Relation Age of Onset   Hypertension Mother    Hypertension Maternal Aunt    Hypertension Paternal Uncle    Hypertension Maternal Grandmother    Hypertension Maternal Grandfather     Allergies  Allergen Reactions  Tramadol Shortness Of Breath and Nausea And Vomiting   Hydrocodone-Acetaminophen Other (See Comments)    Sweats, heavy breathing   Acetaminophen-Codeine Nausea Only    Current Outpatient Medications on File Prior to Visit  Medication Sig Dispense Refill   acetaminophen (TYLENOL) 500 MG tablet Take 1,000 mg by mouth every 6 (six) hours as needed for mild pain or headache.     albuterol (VENTOLIN HFA) 108 (90 Base) MCG/ACT inhaler INHALE 2 PUFFS INTO THE LUNGS EVERY 6 HOURS AS NEEDED FOR WHEEZING OR SHORTNESS OF BREATH 18 g 0   amLODipine (NORVASC) 10 MG tablet 1/2-1 tab po q day 90 tablet 0   butalbital-aspirin-caffeine (FIORINAL) 50-325-40 MG capsule Take 1 capsule by mouth every 6 (six) hours as needed for headache. 12 capsule 0    fluticasone (FLONASE) 50 MCG/ACT nasal spray Place 2 sprays into both nostrils daily. (Patient taking differently: Place 2 sprays into both nostrils daily as needed.) 16 g 1   meloxicam (MOBIC) 15 MG tablet 1 tab po q day 90 tablet 0   ondansetron (ZOFRAN-ODT) 8 MG disintegrating tablet DISSOLVE 1 TABLET(8 MG) ON THE TONGUE EVERY 8 HOURS AS NEEDED FOR NAUSEA OR VOMITING 15 tablet 0   SUMAtriptan (IMITREX) 50 MG tablet May repeat in 2 hours if headache persists or recurs. 10 tablet 0   tiZANidine (ZANAFLEX) 4 MG tablet Take 1 tablet (4 mg total) by mouth every 6 (six) hours as needed for muscle spasms. 30 tablet 0   No current facility-administered medications on file prior to visit.    BP (!) 126/95 (BP Location: Left Arm, Patient Position: Sitting, Cuff Size: Small)    Pulse 70    Temp 98.1 F (36.7 C)    Resp 16    Ht 4\' 10"  (1.473 m)    Wt 165 lb 6.4 oz (75 kg)    SpO2 100%    BMI 34.57 kg/m        Objective:   Physical Exam  General Appearance- Not in acute distress.    Chest and Lung Exam Auscultation: Breath sounds:-Normal. Clear even and unlabored. Adventitious sounds:- No Adventitious sounds.  Cardiovascular Auscultation:Rythm - Regular, rate and rythm. Heart Sounds -Normal heart sounds.  Back Mid lumbar spine tenderness to palpation. Pain on straight leg lift. Pain on lateral movements and flexion/extension of the spine.  Lower ext neurologic  L5-S1 sensation intact bilaterally. Normal patellar reflexes bilaterally. No foot drop bilaterally.   Heent- normal tm's. No frontal maxillary sinus pressure but pain over maxillary sinuses.     Assessment & Plan:   Patient Instructions  For chronic low back pain and radiating pain to rt leg will go ahead and refer you to interventional radiologist for epidural injection. Continue ibuprofen during the interim. Sent reminder to staff to follow up on referral since no one called you yet?  Rx advisement on not taking  ibuprofen if bp greater than 140/90.  For sinus infection following uri type signs/symptoms rx flonase for nasal congestion and azithromycin antibiotic.  Htn well controlled today. Continue amlodipine 10 mg daily.   Follow up 6 month or sooner if needed

## 2021-08-25 NOTE — Telephone Encounter (Signed)
I put in referral to [facility] radiology. Interventional. Pt states she has not been called. Placed referral on 07-27-2021. Will you check on that.  Thanks.

## 2021-08-25 NOTE — Patient Instructions (Addendum)
For chronic low back pain and radiating pain to rt leg will go ahead and refer you to interventional radiologist for epidural injection. Continue ibuprofen during the interim. Sent reminder to staff to follow up on referral since no one called you yet?  Rx advisement on not taking ibuprofen if bp greater than 140/90.  For sinus infection following uri type signs/symptoms rx flonase for nasal congestion and azithromycin antibiotic.  Htn well controlled today. Continue amlodipine 10 mg daily.   Follow up 6 month or sooner if needed

## 2021-09-15 ENCOUNTER — Other Ambulatory Visit: Payer: Self-pay | Admitting: Medical

## 2021-09-15 DIAGNOSIS — M5441 Lumbago with sciatica, right side: Secondary | ICD-10-CM

## 2021-09-19 ENCOUNTER — Other Ambulatory Visit: Payer: Medicaid Other

## 2021-09-23 ENCOUNTER — Other Ambulatory Visit: Payer: Self-pay

## 2021-09-23 ENCOUNTER — Ambulatory Visit
Admission: RE | Admit: 2021-09-23 | Discharge: 2021-09-23 | Disposition: A | Discharge status: Home / Self Care | Payer: Medicaid Other | Source: Ambulatory Visit | Attending: Medical | Admitting: Medical

## 2021-09-23 DIAGNOSIS — M5441 Lumbago with sciatica, right side: Secondary | ICD-10-CM

## 2021-09-23 MED ORDER — IOPAMIDOL (ISOVUE-M 200) INJECTION 41%
1.0000 mL | Freq: Once | INTRAMUSCULAR | Status: AC
  Administered 2021-09-23: 1 mL via EPIDURAL

## 2021-09-23 MED ORDER — METHYLPREDNISOLONE ACETATE 40 MG/ML INJ SUSP (RADIOLOG
80.0000 mg | Freq: Once | INTRAMUSCULAR | Status: AC
  Administered 2021-09-23: 80 mg via EPIDURAL

## 2021-09-23 NOTE — Discharge Instructions (Signed)

## 2021-10-20 ENCOUNTER — Other Ambulatory Visit: Payer: Self-pay | Admitting: Family Medicine

## 2021-10-20 ENCOUNTER — Other Ambulatory Visit: Payer: Self-pay | Admitting: Medical

## 2021-10-20 DIAGNOSIS — M5441 Lumbago with sciatica, right side: Secondary | ICD-10-CM

## 2021-10-20 MED ORDER — ONDANSETRON 8 MG PO TBDP: ORAL_TABLET | ORAL | 0 refills | Status: DC

## 2021-10-20 MED ORDER — BUTALBITAL-ASPIRIN-CAFFEINE 50-325-40 MG PO CAPS: 1.0000 | ORAL_CAPSULE | Freq: Four times a day (QID) | ORAL | 0 refills | Status: DC | PRN

## 2021-10-20 MED ORDER — TIZANIDINE HCL 4 MG PO TABS: 4.0000 mg | ORAL_TABLET | Freq: Four times a day (QID) | ORAL | 0 refills | Status: AC | PRN

## 2021-10-20 MED ORDER — SUMATRIPTAN SUCCINATE 50 MG PO TABS
ORAL_TABLET | ORAL | 0 refills | Status: DC
Start: 2021-10-20 — End: 2022-01-29

## 2021-10-20 MED ORDER — IBUPROFEN 800 MG PO TABS: 800.0000 mg | ORAL_TABLET | Freq: Three times a day (TID) | ORAL | 0 refills | Status: DC | PRN

## 2021-10-20 NOTE — Telephone Encounter (Signed)
Refilled chronic meds today. Including limited # fiorinal.   Esperanza Richters, PA-C

## 2021-10-20 NOTE — Telephone Encounter (Signed)
Rx zanaflex send to pharmacy.

## 2021-11-17 ENCOUNTER — Ambulatory Visit (INDEPENDENT_AMBULATORY_CARE_PROVIDER_SITE_OTHER): Payer: Medicaid Other | Admitting: Medical

## 2021-11-17 ENCOUNTER — Encounter: Payer: Self-pay | Admitting: Medical

## 2021-11-17 VITALS — BP 140/80 | HR 100 | Temp 98.0°F | Resp 18 | Ht <= 58 in | Wt 160.8 lb

## 2021-11-17 DIAGNOSIS — R6889 Other general symptoms and signs: Secondary | ICD-10-CM | POA: Diagnosis not present

## 2021-11-17 DIAGNOSIS — R5383 Other fatigue: Secondary | ICD-10-CM | POA: Diagnosis not present

## 2021-11-17 DIAGNOSIS — R232 Flushing: Secondary | ICD-10-CM

## 2021-11-17 NOTE — Patient Instructions (Addendum)
For hot flashes, heat intolerance and fatigue will get fsh, tsh, t4, iron level, cbc, cmp and b12. ? ?Top of differential dx presently early menopause or  thyroid disorder. But other potential dx as well. ? ?Recommendation following lab results. ? ? ?Htn- reasonably controlled on recheck tough not ideal. Continue amlodipine . Check bp at home 2-3 times a week and update me on those readings. ? ?Follow up date to be determined after lab review. ? ? ? ? ? ? ?

## 2021-11-17 NOTE — Progress Notes (Signed)
Subjective:    Patient ID: Phyllis Adams, female    DOB: January 03, 1976, 46 y.o.   MRN: [id number]  HPI  Pt states 2 months ago she start to get some occasional hot flashes. But over past 2 weeks she is getting hot flashes every 5 minutes. She will randomly break out in sweats. More moody and looses temper easily.  Not reporting fevers.   Pt states some fatigue that is new over past 2 months.  Pt states still having menstraull cycle monthly but some heavy and some light.  Htn- bp mild high. Currently on norvasc.    Review of Systems  Constitutional:  Positive for diaphoresis and fatigue.       With hot flashes.  Respiratory:  Negative for wheezing.   Cardiovascular:  Negative for chest pain and palpitations.  Gastrointestinal:  Negative for abdominal pain, blood in stool, constipation, diarrhea and nausea.  Endocrine: Positive for heat intolerance.       Hot flashes  Genitourinary:  Negative for difficulty urinating, dysuria, enuresis, flank pain and frequency.  Musculoskeletal:  Negative for back pain.  Skin:  Negative for rash.    Past Medical History:  Diagnosis Date   Depression    Femur fracture (HCC) 2006   Hypertension    Migraine    Mood disorder (HCC)      Social History   Socioeconomic History   Marital status: Single    Spouse name: Not on file   Number of children: Not on file   Years of education: Not on file   Highest education level: Not on file  Occupational History   Not on file  Tobacco Use   Smoking status: Every Day    Packs/day: 0.00    Types: Cigars, Cigarettes   Smokeless tobacco: Never   Tobacco comments:    3 cigars with marijuiana  Vaping Use   Vaping Use: Never used  Substance and Sexual Activity   Alcohol use: No   Drug use: Yes    Types: Marijuana   Sexual activity: Yes    Birth control/protection: Other-see comments, Surgical  Other Topics Concern   Not on file  Social History Narrative   Not on file   Social  Determinants of Health   Financial Resource Strain: Not on file  Food Insecurity: Not on file  Transportation Needs: Not on file  Physical Activity: Not on file  Stress: Not on file  Social Connections: Not on file  Intimate Partner Violence: Not on file    Past Surgical History:  Procedure Laterality Date   APPENDECTOMY     CHOLECYSTECTOMY     FEMUR FRACTURE SURGERY Right 2006   FEMUR IM ROD REMOVAL Right 03/11/2016   TUBAL LIGATION      Family History  Problem Relation Age of Onset   Hypertension Mother    Hypertension Maternal Aunt    Hypertension Paternal Uncle    Hypertension Maternal Grandmother    Hypertension Maternal Grandfather     Allergies  Allergen Reactions   Tramadol Shortness Of Breath and Nausea And Vomiting   Hydrocodone-Acetaminophen Other (See Comments)    Sweats, heavy breathing   Acetaminophen-Codeine Nausea Only    Current Outpatient Medications on File Prior to Visit  Medication Sig Dispense Refill   acetaminophen (TYLENOL) 500 MG tablet Take 1,000 mg by mouth every 6 (six) hours as needed for mild pain or headache.     albuterol (VENTOLIN HFA) 108 (90 Base) MCG/ACT inhaler INHALE 2 PUFFS INTO  THE LUNGS EVERY 6 HOURS AS NEEDED FOR WHEEZING OR SHORTNESS OF BREATH 18 g 0   amLODipine (NORVASC) 10 MG tablet 1/2-1 tab po q day 90 tablet 0   butalbital-aspirin-caffeine (FIORINAL) 50-325-40 MG capsule Take 1 capsule by mouth every 6 (six) hours as needed for headache. 12 capsule 0   fluticasone (FLONASE) 50 MCG/ACT nasal spray SHAKE LIQUID AND USE 2 SPRAYS IN EACH NOSTRIL DAILY 48 g 2   ibuprofen (ADVIL) 800 MG tablet Take 1 tablet (800 mg total) by mouth every 8 (eight) hours as needed. 30 tablet 0   ondansetron (ZOFRAN-ODT) 8 MG disintegrating tablet DISSOLVE 1 TABLET(8 MG) ON THE TONGUE EVERY 8 HOURS AS NEEDED FOR NAUSEA OR VOMITING 15 tablet 0   SUMAtriptan (IMITREX) 50 MG tablet May repeat in 2 hours if headache persists or recurs. 10 tablet 0    tiZANidine (ZANAFLEX) 4 MG tablet Take 1 tablet (4 mg total) by mouth every 6 (six) hours as needed for muscle spasms. 30 tablet 0   No current facility-administered medications on file prior to visit.    BP (!) 150/98   Pulse 100   Temp 98 F (36.7 C)   Resp 18   Ht 4\' 10"  (1.473 m)   Wt 160 lb 12.8 oz (72.9 kg)   SpO2 93%   BMI 33.61 kg/m       Objective:   Physical Exam  General Mental Status- Alert. General Appearance- Not in acute distress.   Skin General: Color- Normal Color. Moisture- Normal Moisture.  Neck Carotid Arteries- Normal color. Moisture- Normal Moisture. No carotid bruits. No JVD. No thyromegaly.  Chest and Lung Exam Auscultation: Breath Sounds:-Normal.  Cardiovascular Auscultation:Rythm- Regular. Murmurs & Other Heart Sounds:Auscultation of the heart reveals- No Murmurs.  Abdomen Inspection:-Inspeection Normal. Palpation/Percussion:Note:No mass. Palpation and Percussion of the abdomen reveal- Non Tender, Non Distended + BS, no rebound or guarding.  Neurologic Cranial Nerve exam:- CN III-XII intact(No nystagmus), symmetric smile. Strength:- 5/5 equal and symmetric strength both upper and lower extremities.       Assessment & Plan:   Patient Instructions  For hot flashes, heat intolerance and fatigue will get fsh, tsh, t4, iron level, cbc, cmp and b12.  Top of differential dx presently early menopause or  thyroid disorder. But other potential dx as well.  Recommendation following lab results.   Htn- reasonably controlled on recheck tough not ideal. Continue amlodipine . Check bp at home 2-3 times a week and update me on those readings.  Follow up date to be determined after lab review.       Esperanza Richters, PA-C

## 2021-11-18 LAB — IRON: Iron: 78 ug/dL (ref 42–145)

## 2021-11-18 LAB — CBC WITH DIFFERENTIAL/PLATELET
Basophils Absolute: 0.1 10*3/uL (ref 0.0–0.1)
Basophils Relative: 1.1 % (ref 0.0–3.0)
Eosinophils Absolute: 0.1 10*3/uL (ref 0.0–0.7)
Eosinophils Relative: 1.7 % (ref 0.0–5.0)
HCT: 37.3 % (ref 36.0–46.0)
Hemoglobin: 12.2 g/dL (ref 12.0–15.0)
Lymphocytes Relative: 36.8 % (ref 12.0–46.0)
Lymphs Abs: 2.7 10*3/uL (ref 0.7–4.0)
MCHC: 32.7 g/dL (ref 30.0–36.0)
MCV: 82.4 fl (ref 78.0–100.0)
Monocytes Absolute: 0.4 10*3/uL (ref 0.1–1.0)
Monocytes Relative: 5.8 % (ref 3.0–12.0)
Neutro Abs: 4 10*3/uL (ref 1.4–7.7)
Neutrophils Relative %: 54.6 % (ref 43.0–77.0)
Platelets: 221 10*3/uL (ref 150.0–400.0)
RBC: 4.52 Mil/uL (ref 3.87–5.11)
RDW: 14.1 % (ref 11.5–15.5)
WBC: 7.2 10*3/uL (ref 4.0–10.5)

## 2021-11-18 LAB — FOLLICLE STIMULATING HORMONE: FSH: 48 m[IU]/mL

## 2021-11-18 LAB — COMPREHENSIVE METABOLIC PANEL
ALT: 11 U/L (ref 0–35)
AST: 16 U/L (ref 0–37)
Albumin: 4.4 g/dL (ref 3.5–5.2)
Alkaline Phosphatase: 73 U/L (ref 39–117)
BUN: 10 mg/dL (ref 6–23)
CO2: 26 mEq/L (ref 19–32)
Calcium: 10 mg/dL (ref 8.4–10.5)
Chloride: 103 mEq/L (ref 96–112)
Creatinine, Ser: 0.79 mg/dL (ref 0.40–1.20)
GFR: 89.63 mL/min (ref >60.00)
Glucose, Bld: 95 mg/dL (ref 70–99)
Potassium: 3.9 mEq/L (ref 3.5–5.1)
Sodium: 139 mEq/L (ref 135–145)
Total Bilirubin: 0.3 mg/dL (ref 0.2–1.2)
Total Protein: 6.9 g/dL (ref 6.0–8.3)

## 2021-11-18 LAB — T4, FREE: Free T4: 0.82 ng/dL (ref 0.60–1.60)

## 2021-11-18 LAB — TSH: TSH: 1.01 u[IU]/mL (ref 0.35–5.50)

## 2021-11-18 LAB — VITAMIN B12: Vitamin B-12: 298 pg/mL (ref 211–911)

## 2022-01-05 ENCOUNTER — Ambulatory Visit: Payer: Medicaid Other | Admitting: Medical

## 2022-01-15 ENCOUNTER — Ambulatory Visit (INDEPENDENT_AMBULATORY_CARE_PROVIDER_SITE_OTHER): Payer: Medicaid Other | Admitting: Medical

## 2022-01-15 ENCOUNTER — Encounter: Payer: Self-pay | Admitting: Medical

## 2022-01-15 VITALS — BP 145/85 | HR 70 | Resp 18 | Ht <= 58 in | Wt 170.6 lb

## 2022-01-15 DIAGNOSIS — R232 Flushing: Secondary | ICD-10-CM

## 2022-01-15 DIAGNOSIS — G43809 Other migraine, not intractable, without status migrainosus: Secondary | ICD-10-CM

## 2022-01-15 DIAGNOSIS — J029 Acute pharyngitis, unspecified: Secondary | ICD-10-CM

## 2022-01-15 DIAGNOSIS — I10 Essential (primary) hypertension: Secondary | ICD-10-CM

## 2022-01-15 DIAGNOSIS — M5440 Lumbago with sciatica, unspecified side: Secondary | ICD-10-CM

## 2022-01-15 DIAGNOSIS — M5416 Radiculopathy, lumbar region: Secondary | ICD-10-CM

## 2022-01-15 DIAGNOSIS — G8929 Other chronic pain: Secondary | ICD-10-CM

## 2022-01-15 MED ORDER — KETOROLAC TROMETHAMINE 30 MG/ML IJ SOLN
30.0000 mg | Freq: Once | INTRAMUSCULAR | Status: AC
  Administered 2022-01-15: 30 mg via INTRAMUSCULAR

## 2022-01-15 NOTE — Progress Notes (Signed)
? ?Subjective:  ? ? Patient ID: Phyllis Adams, female    DOB: 07/17/1976, 46 y.o.   MRN: [id number] ? ?HPI ? ?Pt in today stating she has migraine ha history. Pt had migrain like ha more than 10 years. Light and sound sensitive ha with nausea. On review has no seen neurologist. ? ?Back in pastI had rx'd imitrex for acute onset ha. She in past states imitrex did help but does not help recently. Also pt had used fiorinal but that has not helped.  ? ?Pt has ha presently today that started last night. Last night tried both imitrx and fiorinal. Did not help. She has ha presently. Light and sound sensitive. ? ? ?Prior to this ha last ha was 3 month. Did not repsond to med and slept ha off. ? ?Today mild st. 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 No allergy symptoms. No bodyaches.  ? ?Htn- pt did not take bp med this morning. ? ?Hot flashes with high fsh. Appears menopausal. With black cohash hot flashes are less. ? ?Review of Systems  ?Constitutional:  Negative for fatigue and fever.  ?Respiratory:  Negative for cough, choking, shortness of breath and wheezing.   ?Cardiovascular:  Negative for chest pain and palpitations.  ?Gastrointestinal:  Negative for abdominal pain.  ?Genitourinary:  Negative for difficulty urinating, dysuria, flank pain and frequency.  ?Musculoskeletal:  Negative for back pain.  ?Skin:  Negative for rash.  ?Neurological:  Negative for dizziness and headaches.  ?Psychiatric/Behavioral:  Negative for behavioral problems, decreased concentration and sleep disturbance. The patient is not nervous/anxious.   ? ? ?Past Medical History:  ?Diagnosis Date  ? Depression   ? Femur fracture ([place]) 2006  ? Hypertension   ? Migraine   ? Mood disorder ([place])   ? ?  ?Social History  ? ?Socioeconomic History  ? Marital status: Single  ?  Spouse name: Not on file  ? Number of children: Not on file  ? Years of education: Not on file  ? Highest education level: Not on file  ?Occupational History  ? Not on file   ?Tobacco Use  ? Smoking status: Every Day  ?  Packs/day: 0.00  ?  Types: Cigars, Cigarettes  ? Smokeless tobacco: Never  ? Tobacco comments:  ?  3 cigars with marijuiana  ?Vaping Use  ? Vaping Use: Never used  ?Substance and Sexual Activity  ? Alcohol use: No  ? Drug use: Yes  ?  Types: Marijuana  ? Sexual activity: Yes  ?  Birth control/protection: Other-see comments, Surgical  ?Other Topics Concern  ? Not on file  ?Social History Narrative  ? Not on file  ? ?Social Determinants of Health  ? ?Financial Resource Strain: Not on file  ?Food Insecurity: Not on file  ?Transportation Needs: Not on file  ?Physical Activity: Not on file  ?Stress: Not on file  ?Social Connections: Not on file  ?Intimate Partner Violence: Not on file  ? ? ?Past Surgical History:  ?Procedure Laterality Date  ? APPENDECTOMY    ? CHOLECYSTECTOMY    ? FEMUR FRACTURE SURGERY Right 2006  ? FEMUR IM ROD REMOVAL Right 03/11/2016  ? TUBAL LIGATION    ? ? ?Family History  ?Problem Relation Age of Onset  ? Hypertension Mother   ? Hypertension Maternal Aunt   ? Hypertension Paternal Uncle   ? Hypertension Maternal Grandmother   ? Hypertension Maternal Grandfather   ? ? ?Allergies  ?Allergen Reactions  ? Tramadol Shortness  Of Breath and Nausea And Vomiting  ? Hydrocodone-Acetaminophen Other (See Comments)  ?  Sweats, heavy breathing  ? Acetaminophen-Codeine Nausea Only  ? ? ?Current Outpatient Medications on File Prior to Visit  ?Medication Sig Dispense Refill  ? acetaminophen (TYLENOL) 500 MG tablet Take 1,000 mg by mouth every 6 (six) hours as needed for mild pain or headache.    ? albuterol (VENTOLIN HFA) 108 (90 Base) MCG/ACT inhaler INHALE 2 PUFFS INTO THE LUNGS EVERY 6 HOURS AS NEEDED FOR WHEEZING OR SHORTNESS OF BREATH 18 g 0  ? amLODipine (NORVASC) 10 MG tablet 1/2-1 tab po q day 90 tablet 0  ? butalbital-aspirin-caffeine (FIORINAL) 50-325-40 MG capsule Take 1 capsule by mouth every 6 (six) hours as needed for headache. 12 capsule 0  ?  fluticasone (FLONASE) 50 MCG/ACT nasal spray SHAKE LIQUID AND USE 2 SPRAYS IN EACH NOSTRIL DAILY 48 g 2  ? ibuprofen (ADVIL) 800 MG tablet Take 1 tablet (800 mg total) by mouth every 8 (eight) hours as needed. 30 tablet 0  ? ondansetron (ZOFRAN-ODT) 8 MG disintegrating tablet DISSOLVE 1 TABLET(8 MG) ON THE TONGUE EVERY 8 HOURS AS NEEDED FOR NAUSEA OR VOMITING 15 tablet 0  ? SUMAtriptan (IMITREX) 50 MG tablet May repeat in 2 hours if headache persists or recurs. 10 tablet 0  ? tiZANidine (ZANAFLEX) 4 MG tablet Take 1 tablet (4 mg total) by mouth every 6 (six) hours as needed for muscle spasms. 30 tablet 0  ? ?No current facility-administered medications on file prior to visit.  ? ? ?BP (!) 145/85   Pulse 70   Resp 18   Ht 4\' 10"  (1.473 m)   Wt 170 lb 9.6 oz (77.4 kg)   SpO2 100%   BMI 35.66 kg/m?  ?  ?   ?Objective:  ? Physical Exam ? ?General ?Mental Status- Alert. General Appearance- Not in acute distress.  ? ?Skin ?General: Color- Normal Color. Moisture- Normal Moisture. ? ?Neck ?Carotid Arteries- Normal color. Moisture- Normal Moisture. No carotid bruits. No JVD. ? ?Chest and Lung Exam ?Auscultation: ?Breath Sounds:-Normal. ? ?Cardiovascular ?Auscultation:Rythm- Regular. ?Murmurs & Other Heart Sounds:Auscultation of the heart reveals- No Murmurs. ? ?Abdomen ?Inspection:-Inspeection Normal. ?Palpation/Percussion:Note:No mass. Palpation and Percussion of the abdomen reveal- Non Tender, Non Distended + BS, no rebound or guarding. ? ? ?Neurologic ?Cranial Nerve exam:- CN III-XII intact(No nystagmus), symmetric smile. ?Strength:- 5/5 equal and symmetric strength both upper and lower extremities.  ? ?Heent- no tonsil hypertrophy. No exudate. No lymphadenopathy. No neck stiffenss.  Some light sensitivy on exam. ?   ?Assessment & Plan:  ? ?Patient Instructions  ?Migraine headache history with recent failure of both Imitrex and Fiorinal.  Will give Toradol 30 mg IM injection today and hopefully her headache will  subside.  If headache worsens or changes then recommend ED evaluation.  Since headaches no longer responding to treatment decided to go ahead and refer to neurology.  Placed referral to your neurologist of choice.  If they do not call you please give me an update within 10 to 14 days.  Presently for headache giving Toradol 30 mg IM.  Would have given 60 mg dose as your blood pressure been better. ? ?Hypertension-blood pressure is mildly elevated on recheck today.  He did not take BP medication today.  Please take amlodipine when you get back home.  Since blood pressure not ideal today getting lower dose Toradol for your headache. ? ?Mild early sore throat.  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  Unfortunately  do not have rapid strep test in our office but did send culture out.  If pending culture your throat pain worsens let me know and I will give you antibiotic.  Also watch for any allergy type symptoms as sometimes I can cause sore throat.  If allergy symptoms occur then recommend using Claritin or Xyzal over-the-counter.  However make sure you do not use any D formulation as that type can increase blood pressure. ? ?For hot flashes continue black cohosh and follow-up with gynecologist appointment in June. ? ?Follow-up 7 to 10 days nurse blood pressure check or sooner if needed.  ? ?Mackie Pai, PA-C  ?

## 2022-01-15 NOTE — Patient Instructions (Addendum)
Migraine headache history with recent failure of both Imitrex and Fiorinal.  Will give Toradol 30 mg IM injection today and hopefully her headache will subside.  If headache worsens or changes then recommend ED evaluation.  Since headaches no longer responding to treatment decided to go ahead and refer to neurology.  Placed referral to your neurologist of choice.  If they do not call you please give me an update within 10 to 14 days.  Presently for headache giving Toradol 30 mg IM.  Would have given 60 mg dose as your blood pressure been better. ? ?Hypertension-blood pressure is mildly elevated on recheck today.  He did not take BP medication today.  Please take amlodipine when you get back home.  Since blood pressure not ideal today getting lower dose Toradol for your headache. ? ?Mild early sore throat.  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  Unfortunately do not have rapid strep test in our office but did send culture out.  If pending culture your throat pain worsens let me know and I will give you antibiotic.  Also watch for any allergy type symptoms as sometimes I can cause sore throat.  If allergy symptoms occur then recommend using Claritin or Xyzal over-the-counter.  However make sure you do not use any D formulation as that type can increase blood pressure. ? ?For hot flashes continue black cohosh and follow-up with gynecologist appointment in June. ? ?Follow-up 7 to 10 days nurse blood pressure check or sooner if needed. ?

## 2022-01-17 LAB — CULTURE, GROUP A STREP
MICRO NUMBER:: 13383029
SPECIMEN QUALITY:: ADEQUATE

## 2022-01-22 ENCOUNTER — Ambulatory Visit: Payer: Medicaid Other | Admitting: Medical

## 2022-01-29 ENCOUNTER — Other Ambulatory Visit: Payer: Self-pay | Admitting: Medical

## 2022-01-29 DIAGNOSIS — I10 Essential (primary) hypertension: Secondary | ICD-10-CM

## 2022-01-30 MED ORDER — ONDANSETRON 8 MG PO TBDP: ORAL_TABLET | ORAL | 0 refills | Status: DC

## 2022-01-30 MED ORDER — AMLODIPINE BESYLATE 10 MG PO TABS: ORAL_TABLET | ORAL | 0 refills | Status: DC

## 2022-01-30 MED ORDER — IBUPROFEN 800 MG PO TABS
800.0000 mg | ORAL_TABLET | Freq: Three times a day (TID) | ORAL | 0 refills | Status: DC | PRN
Start: 2022-01-30 — End: 2022-06-10

## 2022-01-30 MED ORDER — BUTALBITAL-ASPIRIN-CAFFEINE 50-325-40 MG PO CAPS: 1.0000 | ORAL_CAPSULE | Freq: Four times a day (QID) | ORAL | 0 refills | Status: AC | PRN

## 2022-01-30 MED ORDER — SUMATRIPTAN SUCCINATE 50 MG PO TABS: ORAL_TABLET | ORAL | 0 refills | Status: DC

## 2022-02-02 ENCOUNTER — Encounter: Payer: Self-pay | Admitting: Medical

## 2022-02-03 NOTE — Addendum Note (Signed)
Addended by: Gwenevere Abbot on: 02/03/2022 01:28 PM   Modules accepted: Orders

## 2022-02-05 ENCOUNTER — Ambulatory Visit (INDEPENDENT_AMBULATORY_CARE_PROVIDER_SITE_OTHER): Payer: Medicaid Other | Admitting: Medical

## 2022-02-05 ENCOUNTER — Encounter: Payer: Self-pay | Admitting: Medical

## 2022-02-05 ENCOUNTER — Other Ambulatory Visit: Payer: Self-pay | Admitting: Medical

## 2022-02-05 VITALS — BP 130/80 | HR 65 | Temp 98.2°F | Resp 18 | Ht <= 58 in | Wt 178.6 lb

## 2022-02-05 DIAGNOSIS — I10 Essential (primary) hypertension: Secondary | ICD-10-CM | POA: Diagnosis not present

## 2022-02-05 DIAGNOSIS — M5416 Radiculopathy, lumbar region: Secondary | ICD-10-CM | POA: Diagnosis not present

## 2022-02-05 DIAGNOSIS — M5440 Lumbago with sciatica, unspecified side: Secondary | ICD-10-CM

## 2022-02-05 DIAGNOSIS — G8929 Other chronic pain: Secondary | ICD-10-CM | POA: Diagnosis not present

## 2022-02-05 MED ORDER — KETOROLAC TROMETHAMINE 60 MG/2ML IM SOLN
60.0000 mg | Freq: Once | INTRAMUSCULAR | Status: AC
  Administered 2022-02-05: 60 mg via INTRAMUSCULAR

## 2022-02-05 NOTE — Addendum Note (Signed)
Addended by: Maximino Sarin on: 02/05/2022 11:25 AM   Modules accepted: Orders

## 2022-02-05 NOTE — Progress Notes (Addendum)
Subjective:    Patient ID: Phyllis Adams, female    DOB: Mar 15, 1976, 46 y.o.   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  Degenerative changes seen on MRI and obvious disc herniation with neural impingement as well.  Historically responded well to epidural injections.  recently sen my chart message asking for referral to [facility] imaging for the epidural. Pt states when she gets the epidural will get relief for 3 months. When first started she got 6 months relieve. Last epidural was in [place].  Currently using ibuprofen 800 mg once daily. States she tries to tolerate pain so does not take too often.   Updates me that during covid lost her disability. With working her jobs pain flares easier.  Blood pressure is borderline elevated today.  Presently on amlodipine 10 mg daily.  pt has not been checking bp at home.  2017 mri impression.  CONCLUSION:  1. Symptomatic level favored to be L5-S1 where a small right lateral  recess disc herniation mildly affects the descending right S1 nerve  roots.  2. 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 No other  neural impingement.       Review of Systems  Constitutional:  Negative for chills, fatigue and fever.  Respiratory:  Negative for cough, chest tightness, shortness of breath and wheezing.   Cardiovascular:  Negative for chest pain and palpitations.  Gastrointestinal:  Negative for abdominal pain and diarrhea.  Musculoskeletal:  Positive for back pain. Negative for joint swelling, myalgias and neck stiffness.  Skin:  Negative for rash.  Neurological:        Radicular pain.  Hematological:  Negative for adenopathy. Does not bruise/bleed easily.  Psychiatric/Behavioral:  Negative for behavioral problems. The patient is not nervous/anxious.    Past Medical History:  Diagnosis Date   Depression     Femur fracture ([place]) 2006   Hypertension    Migraine    Mood disorder ([place])      Social History   Socioeconomic History   Marital status: Single    Spouse name: Not on file   Number of children: Not on file   Years of education: Not on file   Highest education level: Not on file  Occupational History   Not on file  Tobacco Use   Smoking status: Every Day    Packs/day: 0.00    Types: Cigars, Cigarettes   Smokeless tobacco: Never   Tobacco comments:    3 cigars with marijuiana  Vaping Use   Vaping Use: Never used  Substance and Sexual Activity   Alcohol use: No   Drug use: Yes    Types: Marijuana   Sexual activity: Yes    Birth control/protection: Other-see comments, Surgical  Other Topics Concern   Not on file  Social History Narrative   Not on file   Social Determinants of Health   Financial Resource Strain: Not on file  Food Insecurity: Not on file  Transportation Needs: Not on file  Physical Activity: Not on file  Stress: Not on file  Social Connections: Not on file  Intimate Partner Violence: Not on file    Past Surgical History:  Procedure Laterality Date   APPENDECTOMY     CHOLECYSTECTOMY     FEMUR FRACTURE SURGERY Right 2006   FEMUR IM ROD REMOVAL Right 03/11/2016   TUBAL LIGATION      Family History  Problem  Relation Age of Onset   Hypertension Mother    Hypertension Maternal Aunt    Hypertension Paternal Uncle    Hypertension Maternal Grandmother    Hypertension Maternal Grandfather     Allergies  Allergen Reactions   Tramadol Shortness Of Breath and Nausea And Vomiting   Hydrocodone-Acetaminophen Other (See Comments)    Sweats, heavy breathing   Acetaminophen-Codeine Nausea Only    Current Outpatient Medications on File Prior to Visit  Medication Sig Dispense Refill   acetaminophen (TYLENOL) 500 MG tablet Take 1,000 mg by mouth every 6 (six) hours as needed for mild pain or headache.     albuterol (VENTOLIN HFA) 108 (90 Base)  MCG/ACT inhaler INHALE 2 PUFFS INTO THE LUNGS EVERY 6 HOURS AS NEEDED FOR WHEEZING OR SHORTNESS OF BREATH 18 g 0   amLODipine (NORVASC) 10 MG tablet 1/2-1 tab po q day 90 tablet 0   butalbital-aspirin-caffeine (FIORINAL) 50-325-40 MG capsule Take 1 capsule by mouth every 6 (six) hours as needed for headache. 12 capsule 0   fluticasone (FLONASE) 50 MCG/ACT nasal spray SHAKE LIQUID AND USE 2 SPRAYS IN EACH NOSTRIL DAILY 48 g 2   ibuprofen (ADVIL) 800 MG tablet Take 1 tablet (800 mg total) by mouth every 8 (eight) hours as needed. 30 tablet 0   ondansetron (ZOFRAN-ODT) 8 MG disintegrating tablet DISSOLVE 1 TABLET(8 MG) ON THE TONGUE EVERY 8 HOURS AS NEEDED FOR NAUSEA OR VOMITING 15 tablet 0   SUMAtriptan (IMITREX) 50 MG tablet May repeat in 2 hours if headache persists or recurs. 10 tablet 0   tiZANidine (ZANAFLEX) 4 MG tablet Take 1 tablet (4 mg total) by mouth every 6 (six) hours as needed for muscle spasms. 30 tablet 0   No current facility-administered medications on file prior to visit.    BP 130/80   Pulse 65   Temp 98.2 F (36.8 C)   Resp 18   Ht 4\' 10"  (1.473 m)   Wt 178 lb 9.6 oz (81 kg)   SpO2 100%   BMI 37.33 kg/m        Objective:   Physical Exam   General- No acute distress. Pleasant patient. Neck- Full range of motion, no jvd Lungs- Clear, even and unlabored. Heart- regular rate and rhythm. Neurologic- CNII- XII grossly intact.  Back- lower back, rt si area tenderness to palpation.    Assessment & Plan:   Patient Instructions  Chronic low back pain with radicular pain. Toradol 60 mg im today. Can use ibuprofen 800 mg up to every 8 hours if needed but wait to use until tomorrow. Can use flexeril at night. If pain worsens pending appointment for epidural let me know and could give short course of norco.  If in future failing epidural could consider referring to neurosurgeon.   Htn- bp better on recheck. Above pain could increase bp. Continue amlodipine and check bp  daily. If bo trending above 140/90 would need to add second bp medication.  Let me know if you call interventional radiology and they still have not got the referral that I place on last Friday.  Follow up in 3 months  wellness exam or sooner if needed.    Mackie Pai, PA-C

## 2022-02-05 NOTE — Patient Instructions (Addendum)
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 Toradol 60 mg im today. Can use ibuprofen 800 mg up to every 8 hours if needed but wait to use until tomorrow. Can use flexeril at night. If pain worsens pending appointment for epidural let me know and could give short course of norco.  If in future failing epidural could consider referring to neurosurgeon.   Htn- bp better on recheck. Above pain could increase bp. Continue amlodipine and check bp daily. If bo trending above 140/90 would need to add second bp medication.  Let me know if you call interventional radiology and they still have not got the referral that I place on last Friday.  Follow up in 3 months  wellness exam or sooner if needed.

## 2022-02-13 ENCOUNTER — Encounter: Payer: Self-pay | Admitting: Medical

## 2022-02-13 ENCOUNTER — Ambulatory Visit: Payer: Medicaid Other | Admitting: Obstetrics and Gynecology

## 2022-02-13 ENCOUNTER — Other Ambulatory Visit (HOSPITAL_COMMUNITY)
Admission: RE | Admit: 2022-02-13 | Discharge: 2022-02-13 | Disposition: A | Discharge status: Home / Self Care | Payer: Medicaid Other | Source: Ambulatory Visit | Attending: Obstetrics and Gynecology | Admitting: Obstetrics and Gynecology

## 2022-02-13 ENCOUNTER — Ambulatory Visit
Admission: RE | Admit: 2022-02-13 | Discharge: 2022-02-13 | Disposition: A | Discharge status: Home / Self Care | Payer: Medicaid Other | Source: Ambulatory Visit | Attending: Medical | Admitting: Medical

## 2022-02-13 ENCOUNTER — Encounter: Payer: Self-pay | Admitting: Obstetrics and Gynecology

## 2022-02-13 VITALS — BP 133/86 | HR 68 | Ht <= 58 in | Wt 176.1 lb

## 2022-02-13 DIAGNOSIS — Z01419 Encounter for gynecological examination (general) (routine) without abnormal findings: Secondary | ICD-10-CM | POA: Insufficient documentation

## 2022-02-13 DIAGNOSIS — M5416 Radiculopathy, lumbar region: Secondary | ICD-10-CM

## 2022-02-13 MED ORDER — METHYLPREDNISOLONE ACETATE 40 MG/ML INJ SUSP (RADIOLOG
80.0000 mg | Freq: Once | INTRAMUSCULAR | Status: AC
  Administered 2022-02-13: 80 mg via EPIDURAL

## 2022-02-13 MED ORDER — IOPAMIDOL (ISOVUE-M 200) INJECTION 41%
1.0000 mL | Freq: Once | INTRAMUSCULAR | Status: AC
  Administered 2022-02-13: 1 mL via EPIDURAL

## 2022-02-13 NOTE — Discharge Instructions (Signed)

## 2022-02-13 NOTE — Progress Notes (Signed)
Subjective:     Phyllis Adams is a 46 y.o. female P7 with LMP 2 months ago and BMI 36 who is here for a comprehensive physical exam. The patient reports some vasomotor symptoms which are starting to become bothersome. She is in a same sex relationship and denies any concerns. She denies dyspareunia, pelvic pain or abnormal discharge. Patient denies urinary incontinence. She reports the hot flashes and night sweats being the most bothersome symptoms. She started black cohosh supplements and reports a slow improvement in her symptoms. Patient is without any complaints  Past Medical History:  Diagnosis Date   Depression    Femur fracture (HCC) 2006   Hypertension    Migraine    Mood disorder (HCC)    Past Surgical History:  Procedure Laterality Date   APPENDECTOMY     CHOLECYSTECTOMY     FEMUR FRACTURE SURGERY Right 2006   FEMUR IM ROD REMOVAL Right 03/11/2016   TUBAL LIGATION     Family History  Problem Relation Age of Onset   Hypertension Mother    Hypertension Maternal Aunt    Hypertension Paternal Uncle    Hypertension Maternal Grandmother    Hypertension Maternal Grandfather       Social History   Socioeconomic History   Marital status: Single    Spouse name: Not on file   Number of children: Not on file   Years of education: Not on file   Highest education level: Not on file  Occupational History   Not on file  Tobacco Use   Smoking status: Every Day    Packs/day: 0.00    Types: Cigars, Cigarettes   Smokeless tobacco: Never   Tobacco comments:    3 cigars with marijuiana  Vaping Use   Vaping Use: Never used  Substance and Sexual Activity   Alcohol use: No   Drug use: Yes    Types: Marijuana   Sexual activity: Yes    Birth control/protection: Other-see comments, Surgical  Other Topics Concern   Not on file  Social History Narrative   Not on file   Social Determinants of Health   Financial Resource Strain: Not on file  Food Insecurity: Not on file   Transportation Needs: Not on file  Physical Activity: Not on file  Stress: Not on file  Social Connections: Not on file  Intimate Partner Violence: Not on file   Health Maintenance  Topic Date Due   COVID-19 Vaccine (1) Never done   MAMMOGRAM  Never done   Hepatitis C Screening  Never done   TETANUS/TDAP  Never done   PAP SMEAR-Modifier  Never done   COLONOSCOPY (Pts 45-16yrs Insurance coverage will need to be confirmed)  Never done   INFLUENZA VACCINE  04/07/2022   HIV Screening  Completed   HPV VACCINES  Aged Out       Review of Systems Pertinent items noted in HPI and remainder of comprehensive ROS otherwise negative.   Objective:  Blood pressure 133/86, pulse 68, height 4\' 10"  (1.473 m), weight 176 lb 1.3 oz (79.9 kg).   GENERAL: Well-developed, well-nourished female in no acute distress.  HEENT: Normocephalic, atraumatic. Sclerae anicteric.  NECK: Supple. Normal thyroid.  LUNGS: Clear to auscultation bilaterally.  HEART: Regular rate and rhythm. BREASTS: Symmetric in size. No palpable masses or lymphadenopathy, skin changes, or nipple drainage. ABDOMEN: Soft, nontender, nondistended. No organomegaly. PELVIC: Normal external female genitalia. Vagina is pink and rugated.  Normal discharge. Normal appearing cervix. Uterus is normal in size.  No adnexal mass or tenderness. Chaperone present during the pelvic exam EXTREMITIES: No cyanosis, clubbing, or edema, 2+ distal pulses.     Assessment:    Healthy female exam.      Plan:    Pap smear collected Screening mammogram ordered Patient will be contacted with abnormal results Discussed continued supplementation with black cohosh Discussed HRT when its been 12 months without a period. Advised patient to keep a menstrual calendar Follow up with PCP for colonoscopy screening See After Visit Summary for Counseling Recommendations

## 2022-02-18 ENCOUNTER — Encounter (HOSPITAL_BASED_OUTPATIENT_CLINIC_OR_DEPARTMENT_OTHER): Payer: Self-pay

## 2022-02-18 ENCOUNTER — Ambulatory Visit (HOSPITAL_BASED_OUTPATIENT_CLINIC_OR_DEPARTMENT_OTHER)
Admission: RE | Admit: 2022-02-18 | Discharge: 2022-02-18 | Disposition: A | Discharge status: Home / Self Care | Payer: Medicaid Other | Source: Ambulatory Visit | Attending: Obstetrics and Gynecology | Admitting: Obstetrics and Gynecology

## 2022-02-18 DIAGNOSIS — Z01419 Encounter for gynecological examination (general) (routine) without abnormal findings: Secondary | ICD-10-CM | POA: Diagnosis not present

## 2022-02-18 DIAGNOSIS — Z1231 Encounter for screening mammogram for malignant neoplasm of breast: Secondary | ICD-10-CM | POA: Diagnosis not present

## 2022-02-19 LAB — CYTOLOGY - PAP
Adequacy: ABSENT
Comment: NEGATIVE
Diagnosis: NEGATIVE
Diagnosis: REACTIVE
High risk HPV: NEGATIVE

## 2022-02-21 ENCOUNTER — Encounter: Payer: Self-pay | Admitting: Medical

## 2022-02-24 ENCOUNTER — Other Ambulatory Visit: Payer: Self-pay | Admitting: Obstetrics and Gynecology

## 2022-02-24 ENCOUNTER — Encounter: Payer: Self-pay | Admitting: Obstetrics and Gynecology

## 2022-02-24 DIAGNOSIS — R928 Other abnormal and inconclusive findings on diagnostic imaging of breast: Secondary | ICD-10-CM

## 2022-02-27 ENCOUNTER — Ambulatory Visit
Admission: RE | Admit: 2022-02-27 | Discharge: 2022-02-27 | Disposition: A | Discharge status: Home / Self Care | Payer: Medicaid Other | Source: Ambulatory Visit | Attending: Obstetrics and Gynecology | Admitting: Obstetrics and Gynecology

## 2022-02-27 DIAGNOSIS — R928 Other abnormal and inconclusive findings on diagnostic imaging of breast: Secondary | ICD-10-CM

## 2022-04-28 ENCOUNTER — Telehealth: Payer: Self-pay | Admitting: Medical

## 2022-04-28 DIAGNOSIS — Z111 Encounter for screening for respiratory tuberculosis: Secondary | ICD-10-CM

## 2022-04-28 NOTE — Telephone Encounter (Signed)
Pt scheduled  

## 2022-04-28 NOTE — Telephone Encounter (Signed)
Pt called requesting a TB blood test for work. Pt stated she would like to get it done this week, if possible.

## 2022-04-29 ENCOUNTER — Other Ambulatory Visit (INDEPENDENT_AMBULATORY_CARE_PROVIDER_SITE_OTHER): Payer: Medicaid Other

## 2022-04-29 ENCOUNTER — Encounter: Payer: Self-pay | Admitting: Medical

## 2022-04-29 DIAGNOSIS — Z111 Encounter for screening for respiratory tuberculosis: Secondary | ICD-10-CM

## 2022-05-02 LAB — QUANTIFERON-TB GOLD PLUS
Mitogen-NIL: >10 IU/mL
NIL: 0.25 IU/mL
QuantiFERON-TB Gold Plus: NEGATIVE
TB1-NIL: <0 IU/mL
TB2-NIL: 0.05 IU/mL

## 2022-05-21 ENCOUNTER — Encounter: Payer: Self-pay | Admitting: Medical

## 2022-05-21 ENCOUNTER — Ambulatory Visit (HOSPITAL_BASED_OUTPATIENT_CLINIC_OR_DEPARTMENT_OTHER)
Admission: RE | Admit: 2022-05-21 | Discharge: 2022-05-21 | Disposition: A | Discharge status: Home / Self Care | Payer: Medicaid Other | Source: Ambulatory Visit | Attending: Medical | Admitting: Medical

## 2022-05-21 ENCOUNTER — Ambulatory Visit (INDEPENDENT_AMBULATORY_CARE_PROVIDER_SITE_OTHER): Payer: Medicaid Other | Admitting: Medical

## 2022-05-21 VITALS — BP 150/90 | HR 84 | Resp 18 | Ht <= 58 in | Wt 173.0 lb

## 2022-05-21 DIAGNOSIS — I10 Essential (primary) hypertension: Secondary | ICD-10-CM | POA: Diagnosis not present

## 2022-05-21 DIAGNOSIS — R059 Cough, unspecified: Secondary | ICD-10-CM

## 2022-05-21 DIAGNOSIS — R0609 Other forms of dyspnea: Secondary | ICD-10-CM | POA: Insufficient documentation

## 2022-05-21 DIAGNOSIS — R0789 Other chest pain: Secondary | ICD-10-CM | POA: Diagnosis not present

## 2022-05-21 DIAGNOSIS — Z20822 Contact with and (suspected) exposure to covid-19: Secondary | ICD-10-CM

## 2022-05-21 DIAGNOSIS — M940 Chondrocostal junction syndrome [Tietze]: Secondary | ICD-10-CM

## 2022-05-21 DIAGNOSIS — R0981 Nasal congestion: Secondary | ICD-10-CM | POA: Diagnosis not present

## 2022-05-21 LAB — COMPREHENSIVE METABOLIC PANEL
ALT: 8 U/L (ref 0–35)
AST: 15 U/L (ref 0–37)
Albumin: 4 g/dL (ref 3.5–5.2)
Alkaline Phosphatase: 77 U/L (ref 39–117)
BUN: 8 mg/dL (ref 6–23)
CO2: 30 mEq/L (ref 19–32)
Calcium: 9.6 mg/dL (ref 8.4–10.5)
Chloride: 105 mEq/L (ref 96–112)
Creatinine, Ser: 0.81 mg/dL (ref 0.40–1.20)
GFR: 86.68 mL/min (ref >60.00)
Glucose, Bld: 73 mg/dL (ref 70–99)
Potassium: 3.5 mEq/L (ref 3.5–5.1)
Sodium: 141 mEq/L (ref 135–145)
Total Bilirubin: 0.4 mg/dL (ref 0.2–1.2)
Total Protein: 7.5 g/dL (ref 6.0–8.3)

## 2022-05-21 LAB — POC COVID19 BINAXNOW: SARS Coronavirus 2 Ag: NEGATIVE

## 2022-05-21 LAB — TROPONIN I (HIGH SENSITIVITY): High Sens Troponin I: 4 ng/L (ref 2–17)

## 2022-05-21 LAB — BRAIN NATRIURETIC PEPTIDE: Pro B Natriuretic peptide (BNP): 47 pg/mL (ref 0.0–100.0)

## 2022-05-21 MED ORDER — BENZONATATE 100 MG PO CAPS
100.0000 mg | ORAL_CAPSULE | Freq: Three times a day (TID) | ORAL | 0 refills | Status: DC | PRN
Start: 2022-05-21 — End: 2022-06-08

## 2022-05-21 MED ORDER — LOSARTAN POTASSIUM 25 MG PO TABS: 25.0000 mg | ORAL_TABLET | Freq: Every day | ORAL | 11 refills | Status: DC

## 2022-05-21 MED ORDER — AZITHROMYCIN 250 MG PO TABS: ORAL_TABLET | ORAL | 0 refills | Status: AC

## 2022-05-21 MED ORDER — FLUTICASONE PROPIONATE 50 MCG/ACT NA SUSP: 2.0000 | Freq: Every day | NASAL | 1 refills | Status: AC

## 2022-05-21 MED ORDER — METHYLPREDNISOLONE 4 MG PO TABS: ORAL_TABLET | ORAL | 0 refills | Status: DC

## 2022-05-21 NOTE — Addendum Note (Signed)
Addended by: Gwenevere Abbot on: 05/21/2022 11:57 AM   Modules accepted: Level of Service

## 2022-05-21 NOTE — Patient Instructions (Addendum)
Recent nasal congestion, cough and postnasal drainage.  Considering allergic rhinitis probable but some exposure to young children at your work.  Rapid COVID test done and came back negative.  If signs symptoms worsen recommend repeating over-the-counter test over the weekend.  Presently prescribing Flonase for nasal congestion, benzonatate for cough and Medrol 6-day taper course.  Atypical chest pain recently intermittently.  On exam he had direct costochondral junction tenderness to palpation and reports some discomfort on deep breathing.  Medrol taper dose often times can help decrease costochondral pain quickly.  EKG done today showed sinus rhythm.  No obvious ischemic changes.  EKG compared to 2018 EKG.  No obvious changes.  As precaution we will do 1 set stat troponin.  If positive will advise emergency department evaluation.  If chest pain worsens despite use of Medrol then advise emergency department as well.  Dyspnea on exertion.  History of this in the past.  Consider possible reactive airway disease.  We will see if Medrol taper course decreases symptoms.  Use albuterol if needed.  Also on review with history of dyspnea on exertion did place referral to cardiologist but looks like that appointment was never made?.  We will place referral to cardiologist again.  Hypertension-blood pressure elevated today and was worse at the dentist recently.  Continue amlodipine and will add on losartan 25 mg daily.  Also will get cmp, bnp and cxr today  Follow-up in 10 days or sooner if needed.

## 2022-05-21 NOTE — Progress Notes (Addendum)
Subjective:    Patient ID: Phyllis Adams, female    DOB: 19-Dec-1975, 46 y.o.   MRN: [id number]  HPI  Pt in for recent nasal congestion, cough and runny nose. Some pnd and sneezing. Pt states some intermittent chills. Symptoms for 3 days. 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 Study came back negative today.   Pt has dry cough. Not coughing up mucus.   Pt state she had some chest discomfort yesterday. This started around 4:30 pm last night. She states at rest no pain. When she walks up stairs get mild shortness.  Pt states anxiety in the past but not recently.  Pt states last week was wheezing. Pt is using flonase and albuterol. Pt had inhalers last year when had covid. Pt states told by mom she has asthma as a child though she does not remember ever using inhaler.    Review of Systems  Constitutional:  Negative for chills, fatigue and fever.  HENT:  Positive for congestion, postnasal drip and sneezing.   Respiratory:  Positive for cough and shortness of breath. Negative for wheezing.   Cardiovascular:  Negative for chest pain and palpitations.  Musculoskeletal:  Negative for back pain, joint swelling and myalgias.       See hpi and exam  Skin:  Negative for rash.  Psychiatric/Behavioral:  Negative for behavioral problems, decreased concentration, dysphoric mood and sleep disturbance. The patient is not nervous/anxious and is not hyperactive.         Objective:   Physical Exam  General Mental Status- Alert. General Appearance- Not in acute distress.   Skin General: Color- Normal Color. Moisture- Normal Moisture.  Neck Carotid Arteries- Normal color. Moisture- Normal Moisture. No carotid bruits. No JVD.  Chest and Lung Exam Auscultation: Breath Sounds:-Normal.  Cardiovascular Auscultation:Rythm- Regular. Murmurs & Other Heart Sounds:Auscultation of the heart reveals- No Murmurs.    Neurologic Cranial Nerve exam:- CN III-XII intact(No nystagmus),  symmetric smile. Strength:- 5/5 equal and symmetric strength both upper and lower extremities.   Lower extremity-no pedal edema.  Anterior thorax-mid sternal and costochondral junction tenderness to palpation.  Easily reproducible.  Some pain in this area on deep inspiration as well.   HEENT-mild nasal congestion.  No frontal or maxillary sinus pressure palpation.  Posterior pharynx shows minimal pink pharynx with mild postnasal drainage.    Assessment & Plan:   Patient Instructions  Recent nasal congestion, cough and postnasal drainage.  Considering allergic rhinitis probable but some exposure to young children at your work.  Rapid COVID test done and came back negative.  If signs symptoms worsen recommend repeating over-the-counter test over the weekend.  Presently prescribing Flonase for nasal congestion, benzonatate for cough and Medrol 6-day taper course.  Atypical chest pain recently intermittently.  On exam he had direct costochondral junction tenderness to palpation and reports some discomfort on deep breathing.  Medrol taper dose often times can help decrease costochondral pain quickly.  EKG done today showed sinus rhythm.  No obvious ischemic changes.  EKG compared to 2018 EKG.  No obvious changes.  As precaution we will do 1 set stat troponin.  If positive will advise emergency department evaluation.  If chest pain worsens despite use of Medrol then advise emergency department as well.  Dyspnea on exertion.  History of this in the past.  Consider possible reactive airway disease.  We will see if Medrol taper course decreases symptoms.  Use albuterol if needed.  Also on review with  history of dyspnea on exertion did place referral to cardiologist but looks like that appointment was never made?.  We will place referral to cardiologist again.  Hypertension-blood pressure elevated today and was worse at the dentist recently.  Continue amlodipine and will add on losartan 25 mg  daily.  Follow-up in 10 days or sooner if needed.   Esperanza Richters, PA-C   [phone]   Time spent with patient today was  41 minutes which consisted of chart review, discussing diagnosis, work up, treatment and documentation.

## 2022-05-29 ENCOUNTER — Ambulatory Visit: Payer: Medicaid Other | Admitting: Medical

## 2022-06-08 ENCOUNTER — Encounter: Payer: Self-pay | Admitting: Medical

## 2022-06-08 ENCOUNTER — Ambulatory Visit (INDEPENDENT_AMBULATORY_CARE_PROVIDER_SITE_OTHER): Payer: Medicaid Other | Admitting: Medical

## 2022-06-08 VITALS — BP 135/80 | HR 84 | Resp 18 | Ht <= 58 in | Wt 168.6 lb

## 2022-06-08 DIAGNOSIS — R319 Hematuria, unspecified: Secondary | ICD-10-CM | POA: Diagnosis not present

## 2022-06-08 DIAGNOSIS — R350 Frequency of micturition: Secondary | ICD-10-CM | POA: Diagnosis not present

## 2022-06-08 DIAGNOSIS — R300 Dysuria: Secondary | ICD-10-CM | POA: Diagnosis not present

## 2022-06-08 DIAGNOSIS — I10 Essential (primary) hypertension: Secondary | ICD-10-CM

## 2022-06-08 DIAGNOSIS — M5416 Radiculopathy, lumbar region: Secondary | ICD-10-CM

## 2022-06-08 LAB — POCT URINALYSIS DIPSTICK
Bilirubin, UA: NEGATIVE
Glucose, UA: NEGATIVE
Ketones, UA: NEGATIVE
Leukocytes, UA: NEGATIVE
Nitrite, UA: NEGATIVE
Protein, UA: POSITIVE — AB
Spec Grav, UA: 1.025 (ref 1.010–1.025)
Urobilinogen, UA: 0.2 E.U./dL
pH, UA: 6 (ref 5.0–8.0)

## 2022-06-08 NOTE — Patient Instructions (Addendum)
For frequent urination, dysuria and some hematuria(symptoms some improved). Will get urine culture. UA looks clear except for blood.   About one year ago when you had uti you also had blood.   Will follow culture and decide on antibiotic.   If no uti by culture will go ahead and refer to urologist due to blood in urine.   Htn- bp better controlled with adding losartan. Check bp 3 times a week at home and want to make sure bp less than 140/90. Continue amlodipine.  Follow up date to be determined after urine culture review.

## 2022-06-08 NOTE — Progress Notes (Signed)
Subjective:    Patient ID: Phyllis Adams, female    DOB: [birth year], 46 y.o.   MRN: [id number]  HPI Pt in for frequent urination over past 2-3 days. Pt states hydrating more after onset of symptoms. Also drinking cranberry juice some. States rare uti in the past but some occasional uti.   Pt states she has used azostandard and some azostandard gummies.    Htn- she is on losartan and amlodipine.  Pt last menses was in May. None since then. Pt does see gyn.   Pt does smoke.     Review of Systems  Constitutional:  Negative for chills, fatigue and fever.  Respiratory:  Negative for cough, chest tightness, shortness of breath and wheezing.   Cardiovascular:  Negative for chest pain and palpitations.  Gastrointestinal:  Negative for abdominal pain and constipation.  Genitourinary:  Negative for dyspareunia, enuresis, frequency, genital sores and hematuria.  Musculoskeletal:  Negative for back pain and myalgias.  Skin:  Negative for pallor and rash.   Past Medical History:  Diagnosis Date   Depression    Femur fracture ([place]) 2006   Hypertension    Migraine    Mood disorder ([place])      Social History   Socioeconomic History   Marital status: Single    Spouse name: Not on file   Number of children: Not on file   Years of education: Not on file   Highest education level: Not on file  Occupational History   Not on file  Tobacco Use   Smoking status: Every Day    Packs/day: 0.00    Types: Cigars, Cigarettes   Smokeless tobacco: Never   Tobacco comments:    3 cigars with marijuiana  Vaping Use   Vaping Use: Never used  Substance and Sexual Activity   Alcohol use: No   Drug use: Yes    Types: Marijuana   Sexual activity: Yes    Birth control/protection: Other-see comments, Surgical  Other Topics Concern   Not on file  Social History Narrative   Not on file   Social Determinants of Health   Financial Resource Strain: Not on file  Food Insecurity: Not on file   Transportation Needs: Not on file  Physical Activity: Not on file  Stress: Not on file  Social Connections: Not on file  Intimate Partner Violence: Not on file    Past Surgical History:  Procedure Laterality Date   APPENDECTOMY     CHOLECYSTECTOMY     FEMUR FRACTURE SURGERY Right 2006   FEMUR IM ROD REMOVAL Right 03/11/2016   TUBAL LIGATION      Family History  Problem Relation Age of Onset   Hypertension Mother    Hypertension Maternal Aunt    Hypertension Paternal Uncle    Hypertension Maternal Grandmother    Hypertension Maternal Grandfather     Allergies  Allergen Reactions   Tramadol Shortness Of Breath and Nausea And Vomiting   Hydrocodone-Acetaminophen Other (See Comments)    Sweats, heavy breathing   Acetaminophen-Codeine Nausea Only    Current Outpatient Medications on File Prior to Visit  Medication Sig Dispense Refill   acetaminophen (TYLENOL) 500 MG tablet Take 1,000 mg by mouth every 6 (six) hours as needed for mild pain or headache.     albuterol (VENTOLIN HFA) 108 (90 Base) MCG/ACT inhaler INHALE 2 PUFFS INTO THE LUNGS EVERY 6 HOURS AS NEEDED FOR WHEEZING OR SHORTNESS OF BREATH 18 g 0   amLODipine (NORVASC) 10 MG tablet  1/2-1 tab po q day 90 tablet 0   butalbital-aspirin-caffeine (FIORINAL) 50-325-40 MG capsule Take 1 capsule by mouth every 6 (six) hours as needed for headache. 12 capsule 0   fluticasone (FLONASE) 50 MCG/ACT nasal spray SHAKE LIQUID AND USE 2 SPRAYS IN EACH NOSTRIL DAILY 48 g 2   fluticasone (FLONASE) 50 MCG/ACT nasal spray Place 2 sprays into both nostrils daily. 16 g 1   ibuprofen (ADVIL) 800 MG tablet Take 1 tablet (800 mg total) by mouth every 8 (eight) hours as needed. 30 tablet 0   losartan (COZAAR) 25 MG tablet Take 1 tablet (25 mg total) by mouth daily. 30 tablet 11   methylPREDNISolone (MEDROL) 4 MG tablet Standard 6 day taper course 21 tablet 0   ondansetron (ZOFRAN-ODT) 8 MG disintegrating tablet DISSOLVE 1 TABLET(8 MG) ON THE  TONGUE EVERY 8 HOURS AS NEEDED FOR NAUSEA OR VOMITING 15 tablet 0   SUMAtriptan (IMITREX) 50 MG tablet May repeat in 2 hours if headache persists or recurs. 10 tablet 0   tiZANidine (ZANAFLEX) 4 MG tablet Take 1 tablet (4 mg total) by mouth every 6 (six) hours as needed for muscle spasms. 30 tablet 0   No current facility-administered medications on file prior to visit.    BP 135/80   Pulse 84   Resp 18   Ht 4\' 10"  (1.473 m)   Wt 168 lb 9.6 oz (76.5 kg)   SpO2 100%   BMI 35.24 kg/m          Objective:   Physical Exam  General Mental Status- Alert. General Appearance- Not in acute distress.   Skin General: Color- Normal Color. Moisture- Normal Moisture.  Neck Carotid Arteries- Normal color. Moisture- Normal Moisture. No carotid bruits. No JVD.  Chest and Lung Exam Auscultation: Breath Sounds:-Normal.  Cardiovascular Auscultation:Rythm- Regular. Murmurs & Other Heart Sounds:Auscultation of the heart reveals- No Murmurs.  Abdomen Inspection:-Inspeection Normal. Palpation/Percussion:Note:No mass. Palpation and Percussion of the abdomen reveal- Non Tender, Non Distended + BS, no rebound or guarding.   Neurologic Cranial Nerve exam:- CN III-XII intact(No nystagmus), symmetric smile. Strength:- 5/5 equal and symmetric strength both upper and lower extremities.       Assessment & Plan:   Patient Instructions  For frequent urination, dysuria and some hematuria(symptoms some improved). Will get urine culture. UA looks clear except for blood.   About one year ago when you had uti you also had blood.   Will follow culture and decide on antibiotic.   If no uti by culture will go ahead and refer to urologist due to blood in urine.   Htn- bp better controlled with adding losartan. Check bp 3 times a week at home and want to make sure bp less than 140/90. Continue amlodipine.  Follow up date to be determined after urine culture review.      Mackie Pai, PA-C

## 2022-06-10 ENCOUNTER — Encounter: Payer: Self-pay | Admitting: Medical

## 2022-06-10 ENCOUNTER — Other Ambulatory Visit: Payer: Self-pay | Admitting: Medical

## 2022-06-10 MED ORDER — CEPHALEXIN 500 MG PO CAPS: 500.0000 mg | ORAL_CAPSULE | Freq: Two times a day (BID) | ORAL | 0 refills | Status: DC

## 2022-06-10 NOTE — Addendum Note (Signed)
Addended by: Anabel Halon on: 06/10/2022 12:54 PM   Modules accepted: Orders

## 2022-06-11 LAB — URINE CULTURE
MICRO NUMBER:: 13994469
SPECIMEN QUALITY:: ADEQUATE

## 2022-06-11 MED ORDER — IBUPROFEN 800 MG PO TABS: 800.0000 mg | ORAL_TABLET | Freq: Three times a day (TID) | ORAL | 0 refills | Status: DC | PRN

## 2022-06-11 MED ORDER — FLUCONAZOLE 150 MG PO TABS: 150.0000 mg | ORAL_TABLET | Freq: Every day | ORAL | 0 refills | Status: DC

## 2022-06-11 NOTE — Addendum Note (Signed)
Addended by: Anabel Halon on: 06/11/2022 03:14 PM   Modules accepted: Orders

## 2022-07-02 DIAGNOSIS — G43909 Migraine, unspecified, not intractable, without status migrainosus: Secondary | ICD-10-CM | POA: Insufficient documentation

## 2022-07-02 DIAGNOSIS — F32A Depression, unspecified: Secondary | ICD-10-CM | POA: Insufficient documentation

## 2022-07-02 DIAGNOSIS — I10 Essential (primary) hypertension: Secondary | ICD-10-CM | POA: Insufficient documentation

## 2022-07-02 DIAGNOSIS — F39 Unspecified mood [affective] disorder: Secondary | ICD-10-CM | POA: Insufficient documentation

## 2022-07-11 IMAGING — US US BREAST*R* LIMITED INC AXILLA
1 series · 13 of 15 positions shown · non-contrast
Comparison: Previous exam(s).

CLINICAL DATA: Patient recalled from screening for bilateral
asymmetries.



[Series 1: us breast*right* limited inc axilla · 0.05mm/px · 13 of 15 slices shown]
[im 1/15]
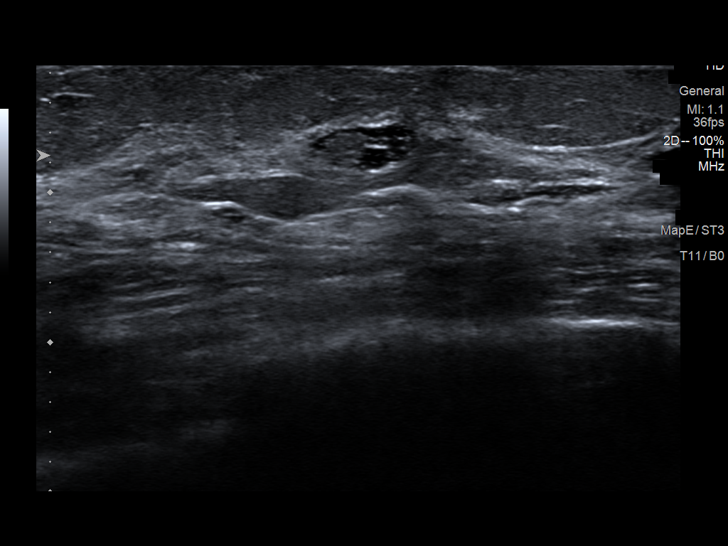
[im 2/15]
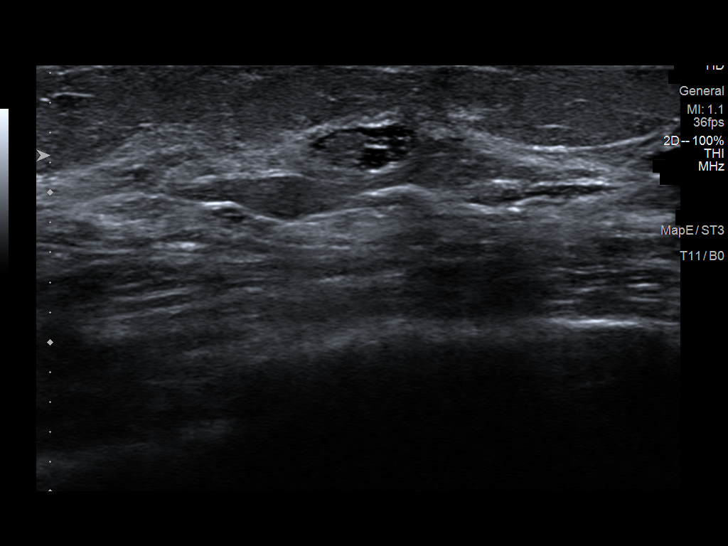
[im 3/15]
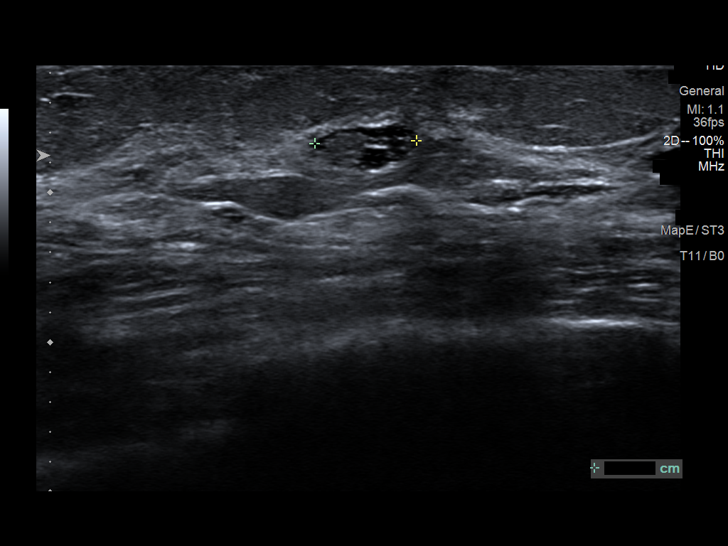
[im 5/15]
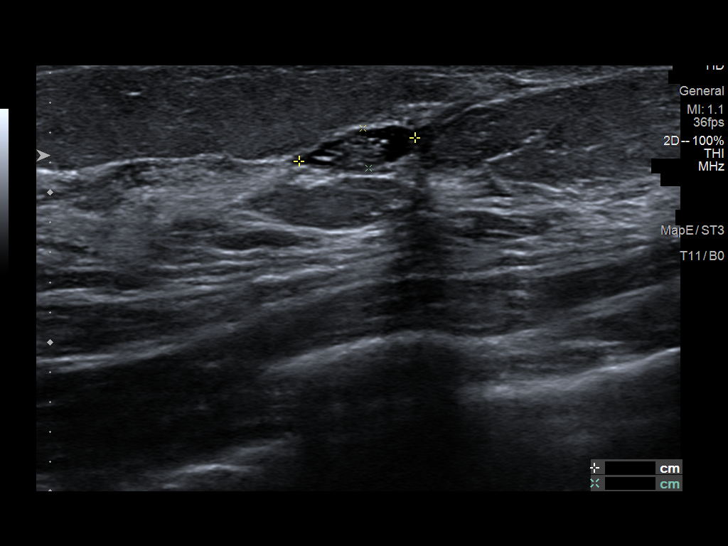
[im 6/15]
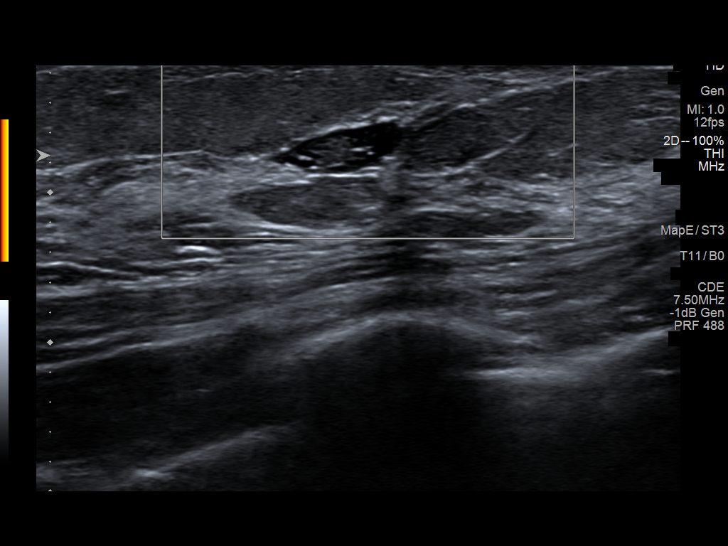
[im 7/15]
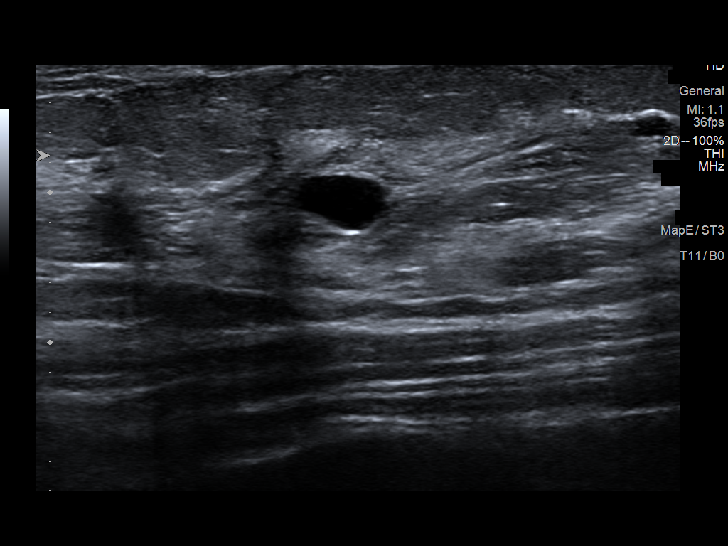
[im 8/15]
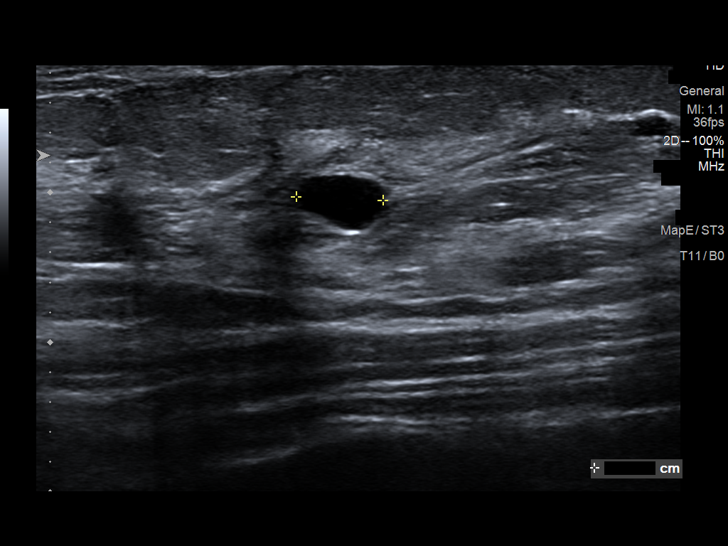
[im 9/15]
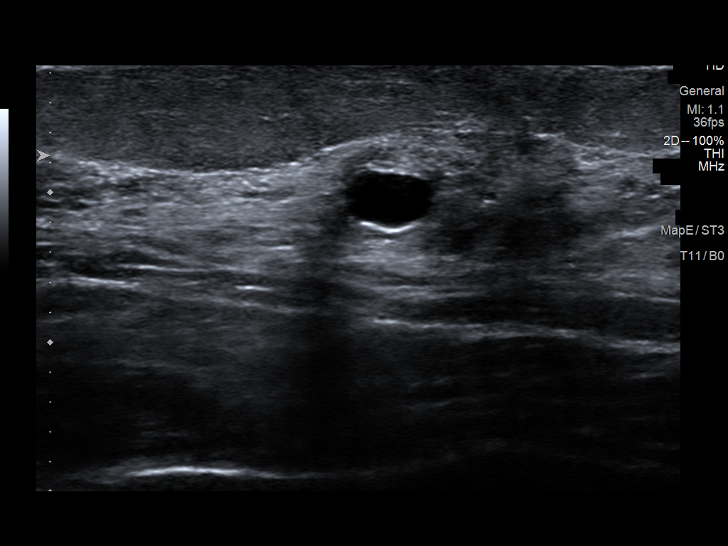
[im 10/15]
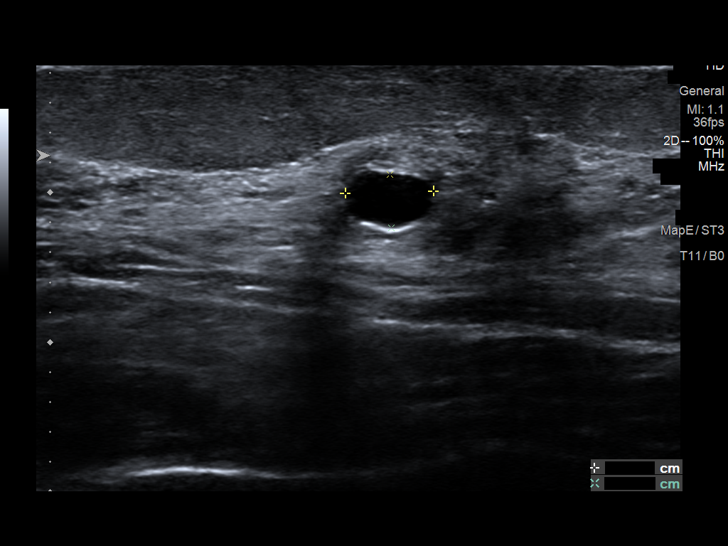
[im 11/15]
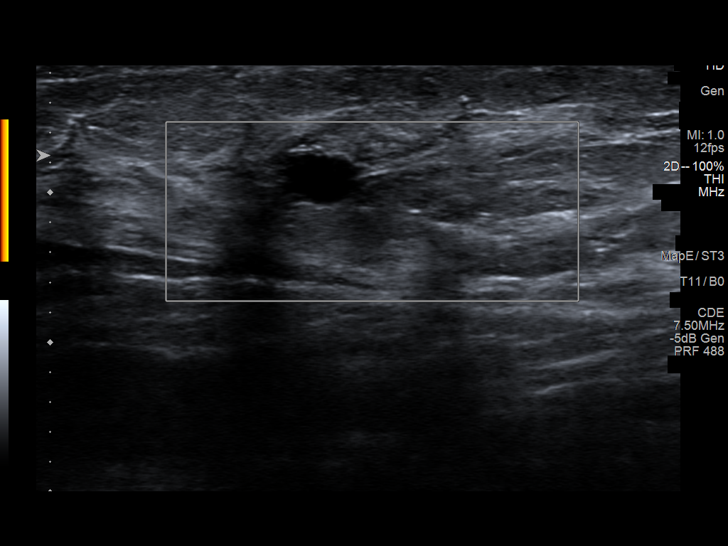
[im 13/15]
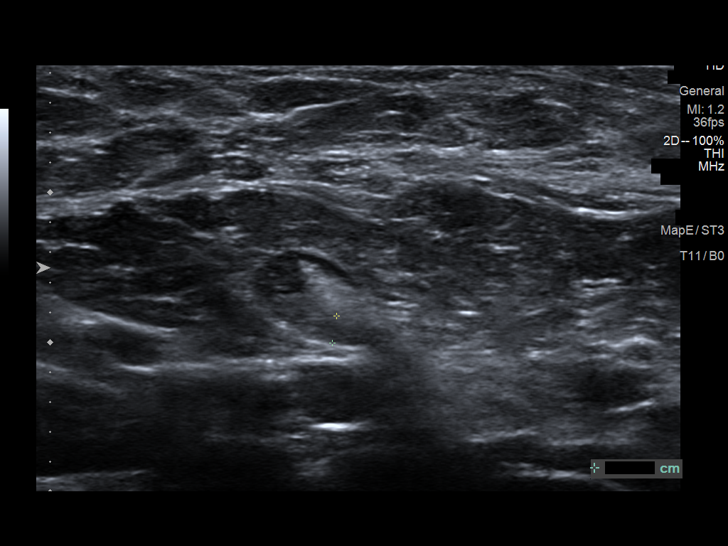
[im 14/15]
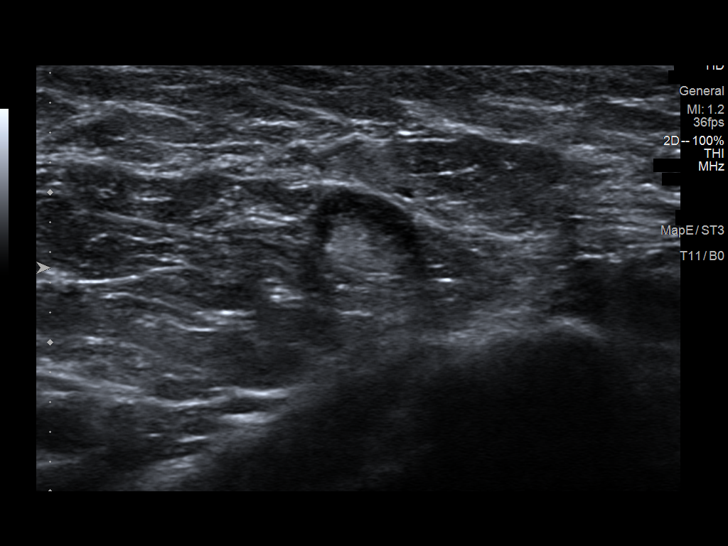
[im 15/15]
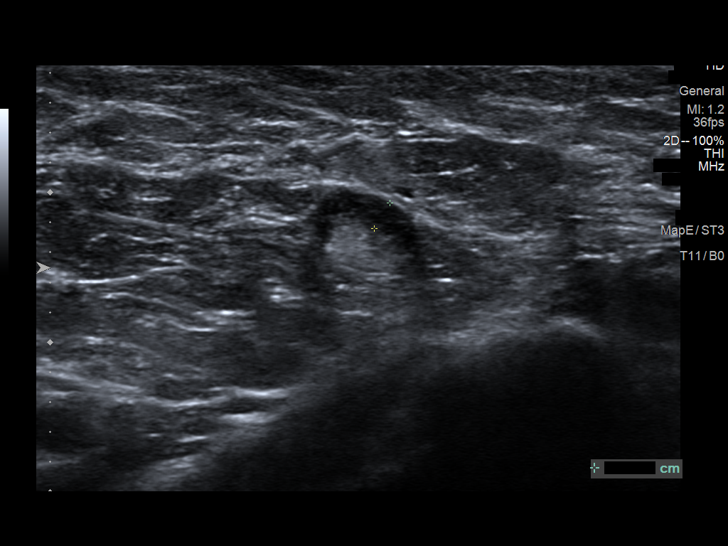

[13 of 15 positions shown; findings below may reference images not displayed]

ACR Breast Density Category c: The breast tissue is heterogeneously
dense, which may obscure small masses.
FINDINGS: Additional imaging of the right and left breast was obtained. Within
the upper-outer left breast there is a persistent oval mass. Within
the outer right breast there are a few small adjacent low-density
oval masses.

Targeted ultrasound is performed, showing a 9 x 7 x 12 mm cyst left
breast 1 o'clock position 1 cm from the nipple.

Within the right breast 9 o'clock position 3 cm from nipple there is
a 7 x 8 x 3 mm complicated cyst. Within the right breast 10 o'clock
position 2 cm from nipple there is a 6 x 6 x 4 mm cyst.
IMPRESSION: Cysts bilaterally.  No mammographic evidence for malignancy.

RECOMMENDATION:
Screening mammogram in one year.(Code:YD-3-4OB)

I have discussed the findings and recommendations with the patient.
If applicable, a reminder letter will be sent to the patient
regarding the next appointment.

BI-RADS CATEGORY  2: Benign.

## 2022-07-11 IMAGING — MG DIGITAL DIAGNOSTIC BILAT W/ TOMO W/ CAD
8 series · 8 of 24 positions shown · non-contrast
Comparison: Previous exam(s).

CLINICAL DATA: Patient recalled from screening for bilateral
asymmetries.



[R CC synth-2D]
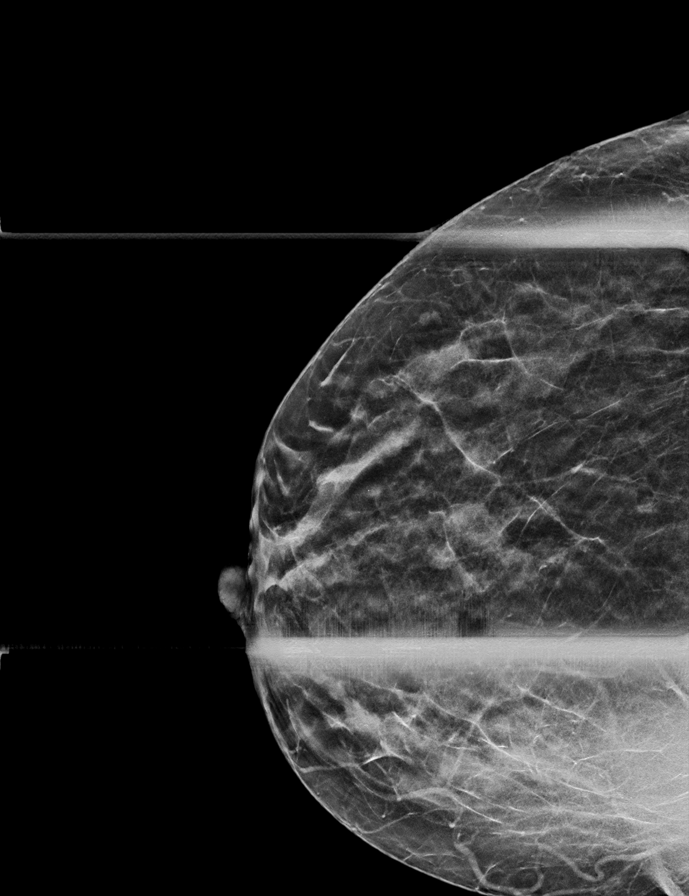

[L CC synth-2D]
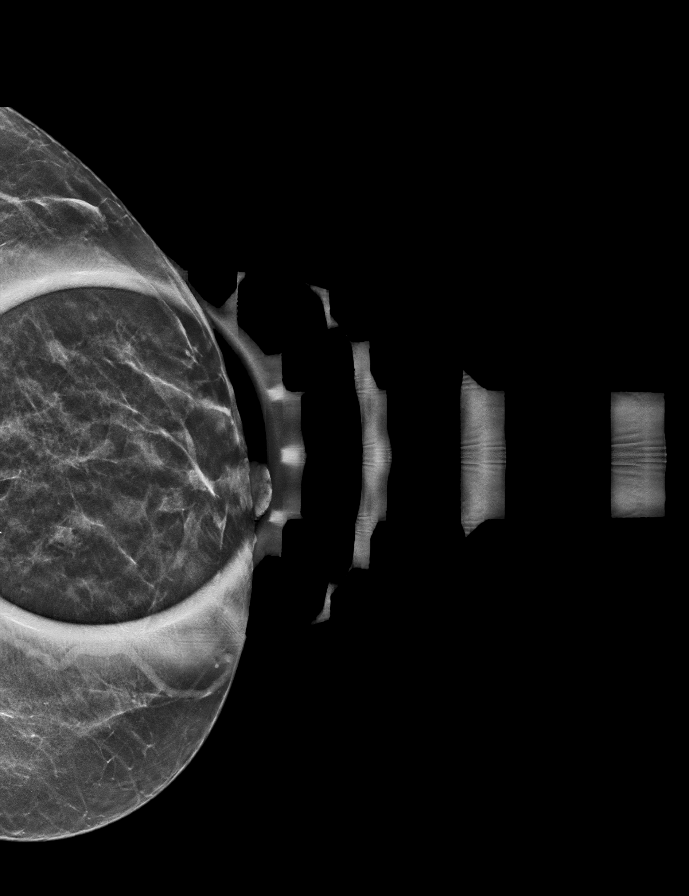

[R ML synth-2D]
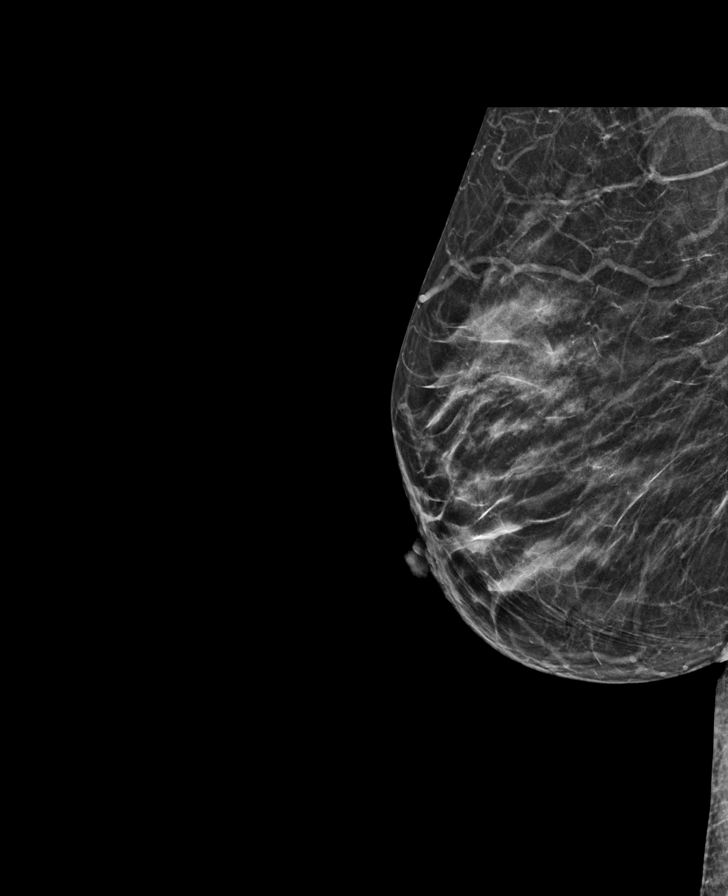

[L MLO synth-2D]
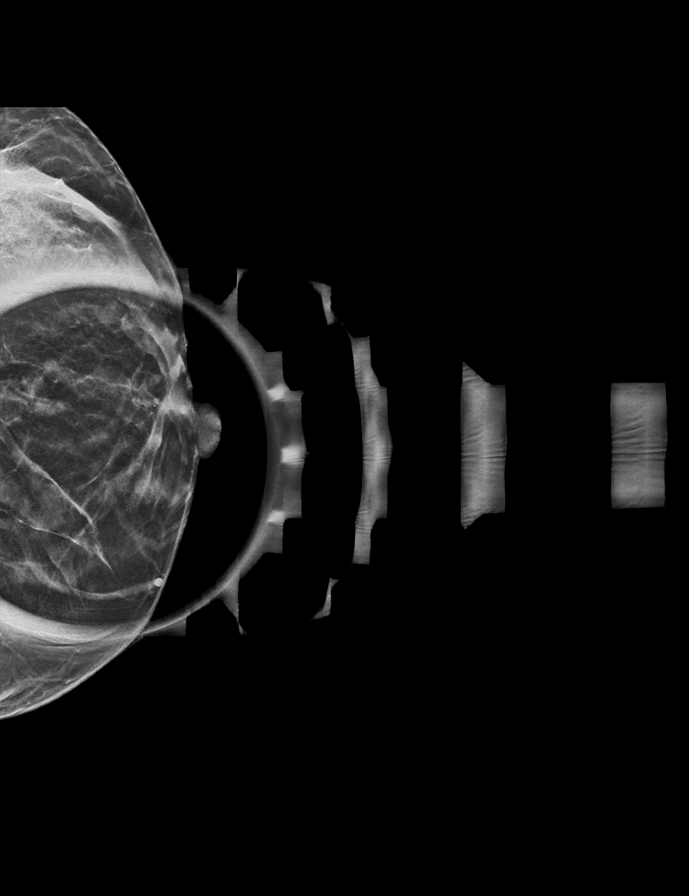

[L CC tomo · tomo slice 18/35.0]
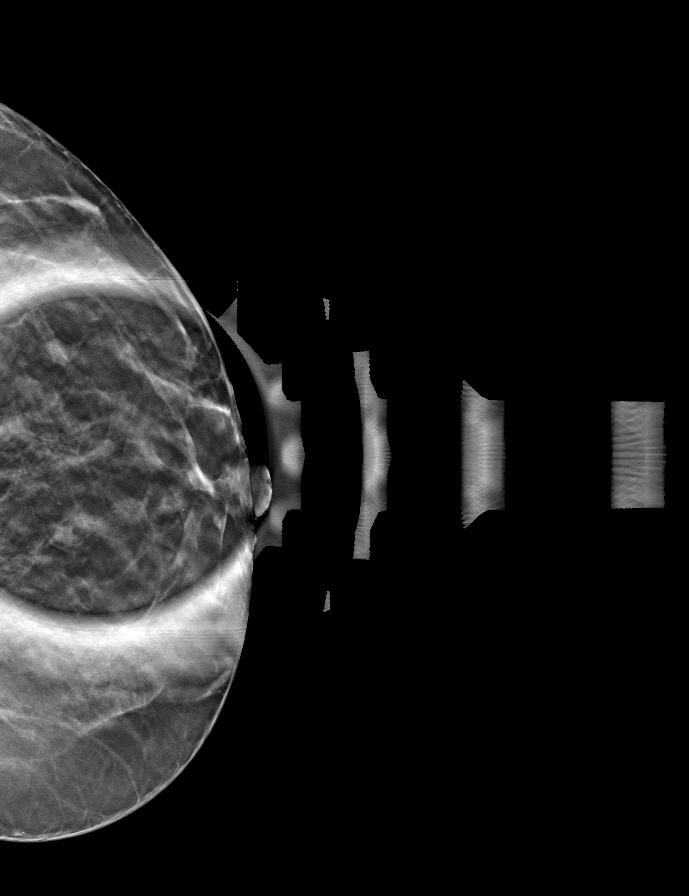

[R ML tomo · tomo slice 25/49.0]
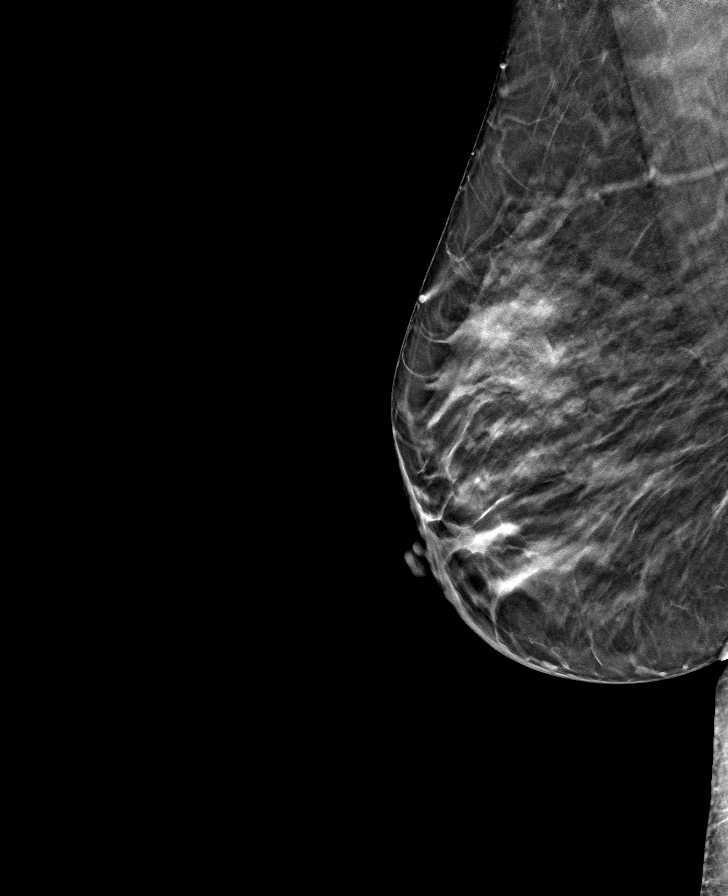

[R CC tomo · tomo slice 19/37.0]
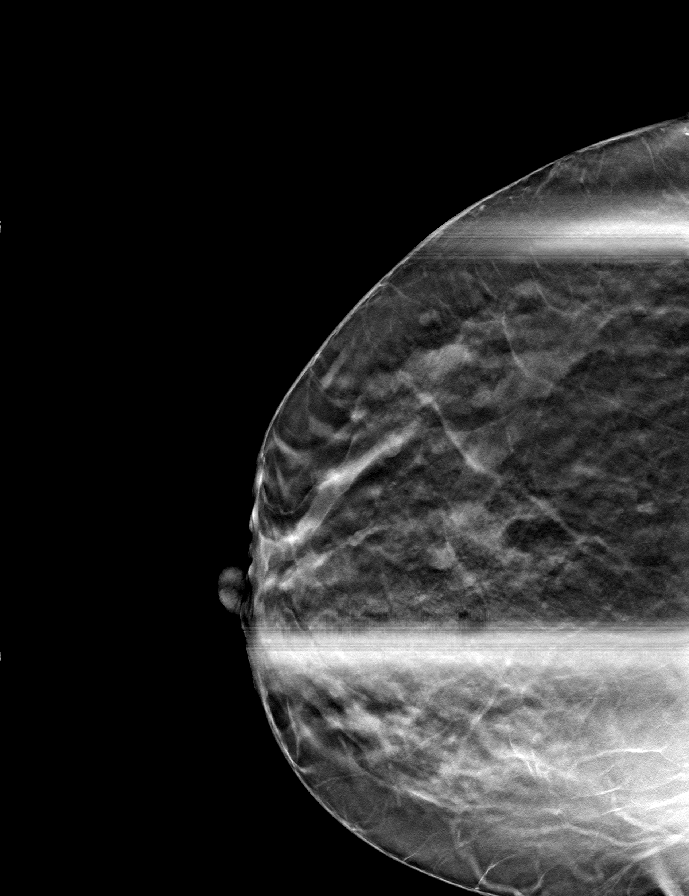

[L MLO tomo · tomo slice 18/35.0]
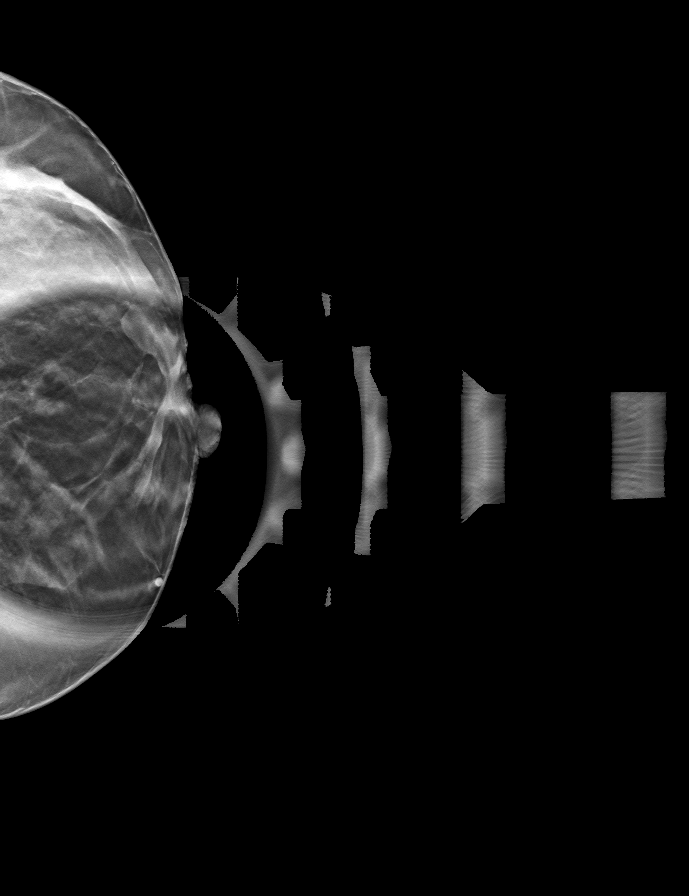

[8 of 24 positions shown; findings below may reference images not displayed]

ACR Breast Density Category c: The breast tissue is heterogeneously
dense, which may obscure small masses.
FINDINGS: Additional imaging of the right and left breast was obtained. Within
the upper-outer left breast there is a persistent oval mass. Within
the outer right breast there are a few small adjacent low-density
oval masses.

Targeted ultrasound is performed, showing a 9 x 7 x 12 mm cyst left
breast 1 o'clock position 1 cm from the nipple.

Within the right breast 9 o'clock position 3 cm from nipple there is
a 7 x 8 x 3 mm complicated cyst. Within the right breast 10 o'clock
position 2 cm from nipple there is a 6 x 6 x 4 mm cyst.
IMPRESSION: Cysts bilaterally.  No mammographic evidence for malignancy.

RECOMMENDATION:
Screening mammogram in one year.(Code:YD-3-4OB)

I have discussed the findings and recommendations with the patient.
If applicable, a reminder letter will be sent to the patient
regarding the next appointment.

BI-RADS CATEGORY  2: Benign.

## 2022-07-15 ENCOUNTER — Encounter: Payer: Self-pay | Admitting: Cardiology

## 2022-07-15 ENCOUNTER — Telehealth: Payer: Self-pay | Admitting: Medical

## 2022-07-15 ENCOUNTER — Ambulatory Visit: Payer: Medicaid Other | Attending: Cardiology | Admitting: Cardiology

## 2022-07-15 VITALS — BP 140/84 | HR 62 | Ht 58.6 in | Wt 170.1 lb

## 2022-07-15 DIAGNOSIS — R0989 Other specified symptoms and signs involving the circulatory and respiratory systems: Secondary | ICD-10-CM

## 2022-07-15 DIAGNOSIS — E669 Obesity, unspecified: Secondary | ICD-10-CM

## 2022-07-15 DIAGNOSIS — I259 Chronic ischemic heart disease, unspecified: Secondary | ICD-10-CM | POA: Diagnosis present

## 2022-07-15 DIAGNOSIS — E66811 Obesity, class 1: Secondary | ICD-10-CM

## 2022-07-15 DIAGNOSIS — F17210 Nicotine dependence, cigarettes, uncomplicated: Secondary | ICD-10-CM

## 2022-07-15 DIAGNOSIS — I209 Angina pectoris, unspecified: Secondary | ICD-10-CM | POA: Insufficient documentation

## 2022-07-15 DIAGNOSIS — R011 Cardiac murmur, unspecified: Secondary | ICD-10-CM

## 2022-07-15 DIAGNOSIS — I10 Essential (primary) hypertension: Secondary | ICD-10-CM | POA: Insufficient documentation

## 2022-07-15 HISTORY — DX: Obesity, class 1: E66.811

## 2022-07-15 HISTORY — DX: Angina pectoris, unspecified: I20.9

## 2022-07-15 HISTORY — DX: Cardiac murmur, unspecified: R01.1

## 2022-07-15 HISTORY — DX: Other specified symptoms and signs involving the circulatory and respiratory systems: R09.89

## 2022-07-15 HISTORY — DX: Nicotine dependence, cigarettes, uncomplicated: F17.210

## 2022-07-15 HISTORY — DX: Obesity, unspecified: E66.9

## 2022-07-15 MED ORDER — METOPROLOL TARTRATE 50 MG PO TABS: ORAL_TABLET | ORAL | 0 refills | Status: DC

## 2022-07-15 MED ORDER — NITROGLYCERIN 0.4 MG SL SUBL: 0.4000 mg | SUBLINGUAL_TABLET | SUBLINGUAL | 6 refills | Status: DC | PRN

## 2022-07-15 NOTE — Telephone Encounter (Signed)
Form completed and pt notified

## 2022-07-15 NOTE — Telephone Encounter (Signed)
Patient dropped off form to be filled out  Placed in [place] bin up front  Patient would like to be called whenever it is ready for pick up [phone])

## 2022-07-15 NOTE — Patient Instructions (Signed)
Medication Instructions:  Your physician has recommended you make the following change in your medication:   Use nitroglycerin 1 tablet placed under the tongue at the first sign of chest pain or an angina attack. 1 tablet may be used every 5 minutes as needed, for up to 15 minutes. Do not take more than 3 tablets in 15 minutes. If pain persist call 911 or go to the nearest ED.   *If you need a refill on your cardiac medications before your next appointment, please call your pharmacy*   Lab Work: Your physician has recommended you have a BMET done today in the office for your CT scan.   [facility] [address] 2nd floor M-W 8-11:30 and 1-4:30 and Thursday and Friday 8-11:30.   If you have labs (blood work) drawn today and your tests are completely normal, you will receive your results only by: MyChart Message (if you have MyChart) OR A paper copy in the mail If you have any lab test that is abnormal or we need to change your treatment, we will call you to review the results.   Testing/Procedures:   Your cardiac CT will be scheduled at one of the below locations:   [facility] [address] [place], [place] [address] [phone]   At [facility], please arrive at the [facility] and Children's Entrance (Entrance C2) of [facility] 30 minutes prior to test start time. You can use the FREE valet parking offered at entrance C (encouraged to control the heart rate for the test)  Proceed to the [facility] Radiology Department (first floor) to check-in and test prep.  All radiology patients and guests should use entrance C2 at [facility], accessed from [facility], even though the hospital's physical address listed is [address]      Please follow these instructions carefully (unless otherwise directed):  On the Night Before the Test: Be sure to Drink plenty of water. Do not consume any caffeinated/decaffeinated  beverages or chocolate 12 hours prior to your test. Do not take any antihistamines 12 hours prior to your test.  On the Day of the Test: Drink plenty of water until 1 hour prior to the test. Do not eat any food 1 hours prior to the test. You may take your regular medications prior to the test.  Take metoprolol (Lopressor) two hours prior to test. This will be a one time dose. FEMALES- please wear underwire-free bra if available, avoid dresses & tight clothing       After the Test: Drink plenty of water. After receiving IV contrast, you may experience a mild flushed feeling. This is normal. On occasion, you may experience a mild rash up to 24 hours after the test. This is not dangerous. If this occurs, you can take Benadryl 25 mg and increase your fluid intake. If you experience trouble breathing, this can be serious. If it is severe call 911 IMMEDIATELY. If it is mild, please call our office. If you take any of these medications: Glipizide/Metformin, Avandament, Glucavance, please do not take 48 hours after completing test unless otherwise instructed.  We will call to schedule your test 2-4 weeks out understanding that some insurance companies will need an authorization prior to the service being performed.   For non-scheduling related questions, please contact the cardiac imaging nurse navigator should you have any questions/concerns: Rockwell Alexandria, Cardiac Imaging Nurse Navigator Larey Brick, Cardiac Imaging Nurse Navigator [facility]  [place]: [phone]   For scheduling needs, including cancellations and rescheduling, please call [place], [phone].   Your physician has requested that you have an echocardiogram. Echocardiography is a painless test that uses sound waves to create images of your heart. It provides your doctor with information about the size and shape of your heart and how well your heart's chambers and valves are working.  This procedure takes approximately one hour. There are no restrictions for this procedure. Please do NOT wear cologne, perfume, aftershave, or lotions (deodorant is allowed). Please arrive 15 minutes prior to your appointment time.  Your physician has requested that you have a renal artery duplex. During this test, an ultrasound is used to evaluate blood flow to the kidneys. Allow one hour for this exam. Do not eat after midnight the day before and avoid carbonated beverages. Take your medications as you usually do.   Follow-Up: At [facility], you and your health needs are our priority.  As part of our continuing mission to provide you with exceptional heart care, we have created designated Provider Care Teams.  These Care Teams include your primary Cardiologist (physician) and Advanced Practice Providers (APPs -  Physician Assistants and Nurse Practitioners) who all work together to provide you with the care you need, when you need it.  We recommend signing up for the patient portal called "MyChart".  Sign up information is provided on this After Visit Summary.  MyChart is used to connect with patients for Virtual Visits (Telemedicine).  Patients are able to view lab/test results, encounter notes, upcoming appointments, etc.  Non-urgent messages can be sent to your provider as well.   To learn more about what you can do with MyChart, go to [email or web address].    Your next appointment:   9 month(s)  The format for your next appointment:   In Person  Provider:   Belva Crome, MD   Other Instructions Cardiac CT Angiogram A cardiac CT angiogram is a procedure to look at the heart and the area around the heart. It may be done to help find the cause of chest pains or other symptoms of heart disease. During this procedure, a substance called contrast dye is injected into the blood vessels in the area to be checked. A large X-ray machine, called a CT scanner, then takes detailed pictures  of the heart and the surrounding area. The procedure is also sometimes called a coronary CT angiogram, coronary artery scanning, or CTA. A cardiac CT angiogram allows the health care provider to see how well blood is flowing to and from the heart. The health care provider will be able to see if there are any problems, such as: Blockage or narrowing of the coronary arteries in the heart. Fluid around the heart. Signs of weakness or disease in the muscles, valves, and tissues of the heart. Tell a health care provider about: Any allergies you have. This is especially important if you have had a previous allergic reaction to contrast dye. All medicines you are taking, including vitamins, herbs, eye drops, creams, and over-the-counter medicines. Any blood disorders you have. Any surgeries you have had. Any medical conditions you have. Whether you are pregnant or may be pregnant. Any anxiety disorders, chronic pain, or other conditions you have that may increase your stress or prevent you from lying still. What are the risks? Generally, this is a safe procedure. However, problems may occur, including: Bleeding. Infection. Allergic reactions to medicines or dyes. Damage to other structures  or organs. Kidney damage from the contrast dye that is used. Increased risk of cancer from radiation exposure. This risk is low. Talk with your health care provider about: The risks and benefits of testing. How you can receive the lowest dose of radiation. What happens before the procedure? Wear comfortable clothing and remove any jewelry, glasses, dentures, and hearing aids. Follow instructions from your health care provider about eating and drinking. This may include: For 12 hours before the procedure -- avoid caffeine. This includes tea, coffee, soda, energy drinks, and diet pills. Drink plenty of water or other fluids that do not have caffeine in them. Being well hydrated can prevent complications. For 4-6  hours before the procedure -- stop eating and drinking. The contrast dye can cause nausea, but this is less likely if your stomach is empty. Ask your health care provider about changing or stopping your regular medicines. This is especially important if you are taking diabetes medicines, blood thinners, or medicines to treat problems with erections (erectile dysfunction). What happens during the procedure?  Hair on your chest may need to be removed so that small sticky patches called electrodes can be placed on your chest. These will transmit information that helps to monitor your heart during the procedure. An IV will be inserted into one of your veins. You might be given a medicine to control your heart rate during the procedure. This will help to ensure that good images are obtained. You will be asked to lie on an exam table. This table will slide in and out of the CT machine during the procedure. Contrast dye will be injected into the IV. You might feel warm, or you may get a metallic taste in your mouth. You will be given a medicine called nitroglycerin. This will relax or dilate the arteries in your heart. The table that you are lying on will move into the CT machine tunnel for the scan. The person running the machine will give you instructions while the scans are being done. You may be asked to: Keep your arms above your head. Hold your breath. Stay very still, even if the table is moving. When the scanning is complete, you will be moved out of the machine. The IV will be removed. The procedure may vary among health care providers and hospitals. What can I expect after the procedure? After your procedure, it is common to have: A metallic taste in your mouth from the contrast dye. A feeling of warmth. A headache from the nitroglycerin. Follow these instructions at home: Take over-the-counter and prescription medicines only as told by your health care provider. If you are told, drink  enough fluid to keep your urine pale yellow. This will help to flush the contrast dye out of your body. Most people can return to their normal activities right after the procedure. Ask your health care provider what activities are safe for you. It is up to you to get the results of your procedure. Ask your health care provider, or the department that is doing the procedure, when your results will be ready. Keep all follow-up visits as told by your health care provider. This is important. Contact a health care provider if: You have any symptoms of allergy to the contrast dye. These include: Shortness of breath. Rash or hives. A racing heartbeat. Summary A cardiac CT angiogram is a procedure to look at the heart and the area around the heart. It may be done to help find the cause of chest  pains or other symptoms of heart disease. During this procedure, a large X-ray machine, called a CT scanner, takes detailed pictures of the heart and the surrounding area after a contrast dye has been injected into blood vessels in the area. Ask your health care provider about changing or stopping your regular medicines before the procedure. This is especially important if you are taking diabetes medicines, blood thinners, or medicines to treat erectile dysfunction. If you are told, drink enough fluid to keep your urine pale yellow. This will help to flush the contrast dye out of your body. This information is not intended to replace advice given to you by your health care provider. Make sure you discuss any questions you have with your health care provider. Document Revised: 04/19/2019 Document Reviewed: 04/19/2019 Elsevier Patient Education  2020 [employer].

## 2022-07-15 NOTE — Progress Notes (Signed)
Cardiology Office Note:    Date:  07/15/2022   ID:  Phyllis Adams, DOB 10/23/1975, MRN [id number]  PCP:  Esperanza Richters, PA-C  Cardiologist:  Garwin Brothers, MD   Referring MD: Esperanza Richters, PA-C    ASSESSMENT:    1. Primary hypertension   2. Angina pectoris (HCC)   3. Cardiac murmur   4. Obesity (BMI 30.0-34.9)   5. Cigarette smoker   6. Abdominal bruit    PLAN:    In order of problems listed above:  Primary prevention stressed with the patient.  Importance of compliance with diet and medication stressed and she vocalized understanding. Angina pectoris: His symptoms are very concerning.  She has multiple risk factors for coronary artery disease and in view of this I explained to her multiple modalities invasive and noninvasive methodologies of evaluation.  She prefers coronary angiography with CT scan.  I respect her wishes.  In the interim she was advised to use nitroglycerin on a as needed basis.  Sublingual nitroglycerin prescription was sent, its protocol and 911 protocol explained and the patient vocalized understanding questions were answered to the patient's satisfaction Essential hypertension: Blood pressure is elevated.  This may be partly related to the pain.  She will keep a track of blood pressures at home and send it to [place].  We will do a renal arterial Doppler in view of abdominal bruit. Cigarette smoker: I spent 5 minutes with the patient discussing solely about smoking. Smoking cessation was counseled. I suggested to the patient also different medications and pharmacological interventions. Patient is keen to try stopping on its own at this time. He will get back to me if he needs any further assistance in this matter. Obesity: Risks of obesity explained weight reduction stressed and she promises to do better. Patient will be seen in follow-up appointment in 6 months or earlier if the patient has any concerns    Medication Adjustments/Labs and Tests  Ordered: Current medicines are reviewed at length with the patient today.  Concerns regarding medicines are outlined above.  No orders of the defined types were placed in this encounter.  No orders of the defined types were placed in this encounter.    History of Present Illness:    Phyllis Adams is a 46 y.o. female who is being seen today for the evaluation of dyspnea on exertion and chest pain at the request of [name], Phyllis Adams, [place].  Patient has past medical history of essential hypertension.  She mentions to me that she has significant pain in her lower extremity.  She has had orthopedic surgery and whenever she is in pain her blood pressure is elevated.  She gives history of chest tightness and this happens with exertion or stress.  No radiation to the neck or to the arms.  This is concerning to her.  At the time of my evaluation, the patient is alert awake oriented and in no distress.  Unfortunately she continues to smoke since young age.  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   albuterol (VENTOLIN HFA) 108 (90 Base) MCG/ACT inhaler INHALE 2 PUFFS INTO THE LUNGS EVERY 6 HOURS AS NEEDED FOR WHEEZING OR SHORTNESS OF BREATH    amLODipine (NORVASC) 10 MG tablet 1/2-1 tab po q day   butalbital-aspirin-caffeine (FIORINAL) 50-325-40 MG capsule Take 1 capsule by mouth every 6 (six) hours as needed for headache.   fluticasone (FLONASE) 50 MCG/ACT nasal spray Place 2 sprays into both nostrils daily.   ibuprofen (ADVIL) 800 MG tablet Take 1 tablet (800 mg total) by mouth every 8 (eight) hours as needed for moderate pain.   losartan (COZAAR) 25 MG tablet Take 1 tablet (25 mg total) by mouth daily.   ondansetron (ZOFRAN-ODT) 8 MG disintegrating tablet DISSOLVE 1 TABLET(8 MG) ON THE TONGUE EVERY 8 HOURS AS NEEDED FOR NAUSEA OR VOMITING   SUMAtriptan (IMITREX) 50 MG tablet May repeat in 2 hours if headache persists or recurs.   tiZANidine (ZANAFLEX) 4 MG tablet Take 1 tablet (4 mg total) by mouth every 6 (six) hours as needed for muscle spasms.     Allergies:   Tramadol, Hydrocodone-acetaminophen, and Acetaminophen-codeine   Social History   Socioeconomic History   Marital status: Single    Spouse name: Not on file   Number of children: Not on file   Years of education: Not on file   Highest education level: Not on file  Occupational History   Not on file  Tobacco Use   Smoking status: Every Day    Packs/day: 0.00    Types: Cigars, Cigarettes   Smokeless tobacco: Never   Tobacco comments:    3 cigars with marijuiana  Vaping Use   Vaping Use: Never used  Substance and Sexual Activity   Alcohol use: No   Drug use: Yes    Types: Marijuana   Sexual activity: Yes    Birth control/protection: Other-see comments, Surgical  Other Topics Concern   Not on file  Social History Narrative   Not on file   Social Determinants of Health   Financial Resource Strain: Not on file  Food Insecurity: Not on file  Transportation Needs: Not on file  Physical Activity: Not on file  Stress: Not on file  Social Connections: Not on file     Family History: The patient's family history includes Hypertension in her maternal  aunt, maternal grandfather, maternal grandmother, mother, and paternal uncle.  ROS:   Please see the history of present illness.    All other systems reviewed and are negative.  EKGs/Labs/Other Studies Reviewed:    The following studies were reviewed today: EKG reveals sinus rhythm and nonspecific ST-T changes   Recent Labs: 11/17/2021: Hemoglobin 12.2; Platelets 221.0; TSH 1.01 05/21/2022: ALT 8; BUN 8; Creatinine, Ser 0.81; Potassium 3.5; Pro B Natriuretic peptide (BNP) 47.0; Sodium 141  Recent Lipid Panel    Component Value Date/Time   CHOL 126 06/02/2021 1052   TRIG 48.0 06/02/2021 1052   HDL 44.80 06/02/2021 1052   CHOLHDL 3 06/02/2021 1052   VLDL 9.6 06/02/2021 1052   LDLCALC 71 06/02/2021 1052   LDLCALC 76 05/21/2020 1052  Physical Exam:    VS:  BP (!) 140/84   Pulse 62   Ht 4' 10.6" (1.488 m)   Wt 170 lb 1.3 oz (77.1 kg)   SpO2 99%   BMI 34.82 kg/m     Wt Readings from Last 3 Encounters:  07/15/22 170 lb 1.3 oz (77.1 kg)  06/08/22 168 lb 9.6 oz (76.5 kg)  05/21/22 173 lb (78.5 kg)     GEN: Patient is in no acute distress HEENT: Normal NECK: No JVD; No carotid bruits LYMPHATICS: No lymphadenopathy CARDIAC: S1 S2 regular, 2/6 systolic murmur at the apex. RESPIRATORY:  Clear to auscultation without rales, wheezing or rhonchi  ABDOMEN: Soft, non-tender, non-distended MUSCULOSKELETAL:  No edema; No deformity  SKIN: Warm and dry NEUROLOGIC:  Alert and oriented x 3 PSYCHIATRIC:  Normal affect    Signed, Garwin Brothers, MD  07/15/2022 10:46 AM    [facility]

## 2022-07-16 LAB — BASIC METABOLIC PANEL
BUN/Creatinine Ratio: 13 (ref 9–23)
BUN: 9 mg/dL (ref 6–24)
CO2: 24 mmol/L (ref 20–29)
Calcium: 9.3 mg/dL (ref 8.7–10.2)
Chloride: 103 mmol/L (ref 96–106)
Creatinine, Ser: 0.72 mg/dL (ref 0.57–1.00)
Glucose: 79 mg/dL (ref 70–99)
Potassium: 4 mmol/L (ref 3.5–5.2)
Sodium: 141 mmol/L (ref 134–144)
eGFR: 104 mL/min/{1.73_m2} (ref >59)

## 2022-07-28 ENCOUNTER — Telehealth (HOSPITAL_COMMUNITY): Payer: Self-pay | Admitting: Emergency Medicine

## 2022-07-28 NOTE — Telephone Encounter (Signed)
Calling patient to review CCTA instructions. Pt states she never picked up one time dose metoprolol for HR control. Pt states the only reason this test was ordered was bc her provider was concerned about her BP but she knows her BP is related to her pain. Cancelled CCTA but willing to keep appt for renal US at [facility] office on Monday 11/27  Rockwell Alexandria RN Navigator Cardiac Imaging [facility] [phone] Office  [phone] Cell

## 2022-07-29 ENCOUNTER — Encounter (HOSPITAL_COMMUNITY): Payer: Self-pay

## 2022-07-29 ENCOUNTER — Ambulatory Visit (HOSPITAL_COMMUNITY): Payer: Medicaid Other

## 2022-08-03 ENCOUNTER — Other Ambulatory Visit: Payer: Self-pay | Admitting: Cardiology

## 2022-08-03 ENCOUNTER — Ambulatory Visit (HOSPITAL_COMMUNITY): Admission: RE | Admit: 2022-08-03 | Payer: Medicaid Other | Source: Ambulatory Visit

## 2022-08-03 ENCOUNTER — Encounter (HOSPITAL_COMMUNITY): Payer: Self-pay

## 2022-08-03 DIAGNOSIS — I259 Chronic ischemic heart disease, unspecified: Secondary | ICD-10-CM

## 2022-08-03 DIAGNOSIS — I209 Angina pectoris, unspecified: Secondary | ICD-10-CM

## 2022-08-03 DIAGNOSIS — R0989 Other specified symptoms and signs involving the circulatory and respiratory systems: Secondary | ICD-10-CM

## 2022-08-03 DIAGNOSIS — E669 Obesity, unspecified: Secondary | ICD-10-CM

## 2022-08-03 DIAGNOSIS — F17210 Nicotine dependence, cigarettes, uncomplicated: Secondary | ICD-10-CM

## 2022-08-03 DIAGNOSIS — I10 Essential (primary) hypertension: Secondary | ICD-10-CM

## 2022-08-03 DIAGNOSIS — R011 Cardiac murmur, unspecified: Secondary | ICD-10-CM

## 2022-08-03 DIAGNOSIS — E66811 Obesity, class 1: Secondary | ICD-10-CM

## 2022-09-04 ENCOUNTER — Encounter: Payer: Self-pay | Admitting: Medical

## 2022-09-05 NOTE — Addendum Note (Signed)
Addended by: Gwenevere Abbot on: 09/05/2022 07:10 AM   Modules accepted: Orders

## 2022-09-09 ENCOUNTER — Ambulatory Visit (HOSPITAL_BASED_OUTPATIENT_CLINIC_OR_DEPARTMENT_OTHER): Admission: RE | Admit: 2022-09-09 | Payer: Medicaid Other | Source: Ambulatory Visit

## 2022-09-17 ENCOUNTER — Other Ambulatory Visit: Payer: Self-pay | Admitting: Medical

## 2022-10-16 ENCOUNTER — Other Ambulatory Visit: Payer: Self-pay | Admitting: Medical

## 2022-11-27 ENCOUNTER — Other Ambulatory Visit: Payer: Self-pay | Admitting: Medical

## 2022-12-02 ENCOUNTER — Ambulatory Visit (INDEPENDENT_AMBULATORY_CARE_PROVIDER_SITE_OTHER): Payer: Medicaid Other | Admitting: Medical

## 2022-12-02 VITALS — BP 158/90 | HR 60 | Temp 98.0°F | Resp 18 | Ht <= 58 in | Wt 175.6 lb

## 2022-12-02 DIAGNOSIS — I10 Essential (primary) hypertension: Secondary | ICD-10-CM

## 2022-12-02 DIAGNOSIS — F419 Anxiety disorder, unspecified: Secondary | ICD-10-CM | POA: Diagnosis not present

## 2022-12-02 DIAGNOSIS — G5602 Carpal tunnel syndrome, left upper limb: Secondary | ICD-10-CM

## 2022-12-02 DIAGNOSIS — R768 Other specified abnormal immunological findings in serum: Secondary | ICD-10-CM

## 2022-12-02 DIAGNOSIS — M2550 Pain in unspecified joint: Secondary | ICD-10-CM | POA: Diagnosis not present

## 2022-12-02 MED ORDER — METHYLPREDNISOLONE 4 MG PO TABS: 4.0000 mg | ORAL_TABLET | Freq: Every day | ORAL | 0 refills | Status: DC

## 2022-12-02 MED ORDER — AMLODIPINE BESYLATE 10 MG PO TABS: ORAL_TABLET | ORAL | 3 refills | Status: DC

## 2022-12-02 MED ORDER — ALPRAZOLAM 0.5 MG PO TABS: ORAL_TABLET | ORAL | 0 refills | Status: DC

## 2022-12-02 NOTE — Progress Notes (Signed)
Subjective:    Patient ID: Phyllis Adams, female    DOB: [birth year], 47 y.o.   MRN: [id number]  HPI  Pt in with some recent bilateral forearm, wrist and hands. Pain comes and goes throughout the day. Symptoms  for about one month.   Pt states some burning to forearms and swollen sensation at night. Does not report tingling.  At times wrist and distal forearm hurt. Pt is left handed.  No neck pain reported. No radicular pain.  Pt has htn. Pt is on losartan 25 mg daily. She is not on amlodipine. Bp is high dentist. Pt feels like get anxious when at dentist.    Review of Systems  Constitutional:  Negative for chills, fatigue and fever.  Respiratory:  Negative for cough, chest tightness, shortness of breath and wheezing.   Cardiovascular:  Negative for chest pain and palpitations.  Gastrointestinal:  Negative for abdominal pain.  Musculoskeletal:  Positive for arthralgias.  Hematological:  Negative for adenopathy. Does not bruise/bleed easily.  Psychiatric/Behavioral:  Negative for behavioral problems and confusion.     Past Medical History:  Diagnosis Date   Adhesive capsulitis of right shoulder 03/26/2021   Depression    Femur fracture ([place]) 2006   Hypertension    Lumbar radiculopathy 01/16/2016   Migraine    Mood disorder ([place])    Osteoarthritis of spine with radiculopathy, 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   Retained orthopedic hardware 01/16/2016   Right leg pain 07/16/2016   Segmental and somatic dysfunction of 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   Spasm of muscle 04/15/2016     Social History   Socioeconomic History   Marital status: Single    Spouse name: Not on file   Number of children: Not on file   Years of education: Not on file   Highest education level: Bachelor's degree (e.g., BA, AB, BS)  Occupational History   Not on file  Tobacco Use   Smoking status: Every Day    Packs/day: 0    Types: Cigars, Cigarettes   Smokeless tobacco: Never   Tobacco comments:    3 cigars with  marijuiana  Vaping Use   Vaping Use: Never used  Substance and Sexual Activity   Alcohol use: No   Drug use: Yes    Types: Marijuana   Sexual activity: Yes    Birth control/protection: Other-see comments, Surgical  Other Topics Concern   Not on file  Social History Narrative   Not on file   Social Determinants of Health   Financial Resource Strain: Patient Declined (12/02/2022)   Overall Financial Resource Strain (CARDIA)    Difficulty of Paying Living Expenses: Patient declined  Food Insecurity: Patient Declined (12/02/2022)   Hunger Vital Sign    Worried About Running Out of Food in the Last Year: Patient declined    [place] in the Last Year: Patient declined  Transportation Needs: No Transportation Needs (12/02/2022)   PRAPARE - Hydrologist (Medical): No    Lack of Transportation (Non-Medical): No  Physical Activity: Sufficiently Active (12/02/2022)   Exercise Vital Sign    Days of Exercise per Week: 7 days    Minutes of Exercise per Session: 70 min  Stress: Stress Concern Present (12/02/2022)   [place]    Feeling of Stress : To some extent  Social Connections: Moderately Isolated (12/02/2022)   Social Connection and Isolation Panel [NHANES]    Frequency of Communication with Friends  and Family: More than three times a week    Frequency of Social Gatherings with Friends and Family: Once a week    Attends Religious Services: Never    Marine scientist or Organizations: No    Attends Music therapist: Not on file    Marital Status: Married  Human resources officer Violence: Not on file    Past Surgical History:  Procedure Laterality Date   APPENDECTOMY     CHOLECYSTECTOMY     FEMUR FRACTURE SURGERY Right 2006   FEMUR IM ROD REMOVAL Right 03/11/2016   TUBAL LIGATION      Family History  Problem Relation Age of Onset   Hypertension Mother     Hypertension Maternal Aunt    Hypertension Paternal Uncle    Hypertension Maternal Grandmother    Hypertension Maternal Grandfather     Allergies  Allergen Reactions   Tramadol Shortness Of Breath and Nausea And Vomiting   Hydrocodone-Acetaminophen Other (See Comments)    Sweats, heavy breathing   Acetaminophen-Codeine Nausea Only    Current Outpatient Medications on File Prior to Visit  Medication Sig Dispense Refill   acetaminophen (TYLENOL) 500 MG tablet Take 1,000 mg by mouth every 6 (six) hours as needed for mild pain or headache.     albuterol (VENTOLIN HFA) 108 (90 Base) MCG/ACT inhaler INHALE 2 PUFFS INTO THE LUNGS EVERY 6 HOURS AS NEEDED FOR WHEEZING OR SHORTNESS OF BREATH 18 g 0   amLODipine (NORVASC) 10 MG tablet 1/2-1 tab po q day 90 tablet 0   butalbital-aspirin-caffeine (FIORINAL) 50-325-40 MG capsule Take 1 capsule by mouth every 6 (six) hours as needed for headache. 12 capsule 0   fluticasone (FLONASE) 50 MCG/ACT nasal spray Place 2 sprays into both nostrils daily. 16 g 1   ibuprofen (ADVIL) 800 MG tablet TAKE 1 TABLET(800 MG) BY MOUTH EVERY 8 HOURS AS NEEDED FOR MODERATE PAIN 30 tablet 0   losartan (COZAAR) 25 MG tablet Take 1 tablet (25 mg total) by mouth daily. 30 tablet 11   metoprolol tartrate (LOPRESSOR) 50 MG tablet Take 50 mg Lopressor 2 hours prior to your CT scan for a heart rate greater than 55. 1 tablet 0   ondansetron (ZOFRAN-ODT) 8 MG disintegrating tablet DISSOLVE 1 TABLET(8 MG) ON THE TONGUE EVERY 8 HOURS AS NEEDED FOR NAUSEA OR VOMITING 15 tablet 0   SUMAtriptan (IMITREX) 50 MG tablet TAKE 1 TABLET BY MOUTH AT ONSET OF MIGRAINE. MAY REPEAT IN 2 HOURS IF HEADACHE PERSISTS OR RECURS 10 tablet 0   tiZANidine (ZANAFLEX) 4 MG tablet Take 1 tablet (4 mg total) by mouth every 6 (six) hours as needed for muscle spasms. 30 tablet 0   nitroGLYCERIN (NITROSTAT) 0.4 MG SL tablet Place 1 tablet (0.4 mg total) under the tongue every 5 (five) minutes as needed. 25 tablet  6   No current facility-administered medications on file prior to visit.    BP (!) 158/90   Pulse 60   Temp 98 F (36.7 C)   Resp 18   Ht 4\' 10"  (1.473 m)   Wt 175 lb 9.6 oz (79.7 kg)   SpO2 100%   BMI 36.70 kg/m        Objective:   Physical Exam  General Mental Status- Alert. General Appearance- Not in acute distress.   Skin General: Color- Normal Color. Moisture- Normal Moisture.  Neck No JVD. No mid spinal tenderness.  Chest and Lung Exam Auscultation: Breath Sounds:-Normal.  Cardiovascular Auscultation:Rythm- Regular. Murmurs &  Other Heart Sounds:Auscultation of the heart reveals- No Murmurs.  Neurologic Cranial Nerve exam:- CN III-XII intact(No nystagmus), symmetric smile. Drift Test:- No drift. Romberg Exam:- Negative.  Heal to Toe Gait exam:-Normal. Finger to Nose:- Normal/Intact Strength:- 5/5 equal and symmetric strength both upper and lower extremities.   Upper ext- hands not swollen.no warmth. No tenderness to palpation. Left wrist- Phalens sign and finklestein test.      Assessment & Plan:  Carpal tunnel syndrome of left wrist  Arthralgia, unspecified joint -     Sedimentation rate -     ANA -     C-reactive protein -     Rheumatoid factor  Hypertension, unspecified type  Anxiety  Some carpel tunnel type symptoms with some features of tenosynovitic. Rx medrol 6 day taper more on left side. Use left wrist cock up splint.  For both side wrist and hand pains did get inflammation studies.  Continue losartan and and on amlodipine 10 mg for better bp control.   For anxiety day of any future dental procedure [place] xanax 0.5 mg 30 minutes prior to dentist appointment. Let them know what meds you are on. I think the stress/worry increasing bp.  Follow up in 2 week or sooner if needed.  Mackie Pai, PA-C

## 2022-12-02 NOTE — Patient Instructions (Addendum)
Carpal tunnel syndrome of left wrist  Arthralgia, unspecified joint -     Sedimentation rate -     ANA -     C-reactive protein -     Rheumatoid factor  Hypertension, unspecified type  Anxiety    Some carpel tunnel type symptoms with some features of tenosynovitis. Rx medrol 6 day taper more on left side. Use left wrist cock up splint.  For both side wrist and hand pains did get inflammation studies.  Continue losartan and and on amlodipine 10 mg for better bp control.   For anxiety day of any future dental procedure [place] xanax 0.5 mg 30 minutes prior to dentist appointment. Let them know what meds you are on. I think the stress/worry increasing bp.  Follow up in 2 week or sooner if needed.

## 2022-12-03 LAB — SEDIMENTATION RATE: Sed Rate: 26 mm/hr — ABNORMAL HIGH (ref 0–20)

## 2022-12-04 LAB — RHEUMATOID FACTOR: Rheumatoid fact SerPl-aCnc: <14 IU/mL (ref <14)

## 2022-12-04 LAB — C-REACTIVE PROTEIN: CRP: 2.2 mg/L (ref <8.0)

## 2022-12-04 LAB — ANA: Anti Nuclear Antibody (ANA): POSITIVE — AB

## 2022-12-04 LAB — ANTI-NUCLEAR AB-TITER (ANA TITER): ANA Titer 1: 1:80 {titer} — ABNORMAL HIGH

## 2022-12-05 NOTE — Addendum Note (Signed)
Addended by: Anabel Halon on: 12/05/2022 10:12 AM   Modules accepted: Orders

## 2022-12-07 ENCOUNTER — Encounter: Payer: Self-pay | Admitting: Medical

## 2022-12-16 ENCOUNTER — Ambulatory Visit (INDEPENDENT_AMBULATORY_CARE_PROVIDER_SITE_OTHER): Payer: Medicaid Other | Admitting: Medical

## 2022-12-16 VITALS — BP 128/77 | HR 76 | Temp 98.0°F | Resp 18 | Ht <= 58 in | Wt 169.2 lb

## 2022-12-16 DIAGNOSIS — T148XXA Other injury of unspecified body region, initial encounter: Secondary | ICD-10-CM

## 2022-12-16 DIAGNOSIS — M2550 Pain in unspecified joint: Secondary | ICD-10-CM

## 2022-12-16 DIAGNOSIS — R768 Other specified abnormal immunological findings in serum: Secondary | ICD-10-CM

## 2022-12-16 DIAGNOSIS — R232 Flushing: Secondary | ICD-10-CM | POA: Diagnosis not present

## 2022-12-16 DIAGNOSIS — R5383 Other fatigue: Secondary | ICD-10-CM | POA: Diagnosis not present

## 2022-12-16 DIAGNOSIS — M542 Cervicalgia: Secondary | ICD-10-CM

## 2022-12-16 MED ORDER — PREDNISONE 10 MG (21) PO TBPK: ORAL_TABLET | ORAL | 0 refills | Status: DC

## 2022-12-16 NOTE — Patient Instructions (Addendum)
1. Arthralgia, unspecified joint. New referral placed. Ask you call the office directly where you want to be seen. Let me now if by next Tuesday they don't the referral.   - Ambulatory referral to Rheumatology  2. Positive ANA (antinuclear antibody) - Ambulatory referral to Rheumatology  3. Hot flashes - FSH; Future - CBC w/Diff  4. Fatigue, unspecified type Please schedule the future labs.  - TSH; Future - B12; Future - Vitamin B1; Future - CBC w/Diff; Future - Comp Met (CMET); Future  5. Bruising - CBC w/Diff;(future lab)  New neck pain with some upper extremity pain. Will place xray order c spine.  On discussion for above pending referral labs and xray rx taper prednisone.  Follow up date to be determined after lab and xray review.

## 2022-12-16 NOTE — Progress Notes (Signed)
Subjective:    Patient ID: Phyllis Adams, female    DOB: 21-Sep-1975, 47 y.o.   MRN: [id number]  HPI  Pt seen last visit for the below.  "Carpal tunnel syndrome of left wrist   Arthralgia, unspecified joint -     Sedimentation rate -     ANA -     C-reactive protein -     Rheumatoid factor   Hypertension, unspecified type   Anxiety   Some carpel tunnel type symptoms with some features of tenosynovitic. Rx medrol 6 day taper more on left side. Use left wrist cock up splint.   For both side wrist and hand pains did get inflammation studies.   Continue losartan and and on amlodipine 10 mg for better bp control.    For anxiety day of any future dental procedure [place] xanax 0.5 mg 30 minutes prior to dentist appointment. Let them know what meds you are on. I think the stress/worry increasing bp."    Update since the above Pt declined referral to [place]. Pt is still reporting some joint pains and muscles pain daily. Progressively get worse throughout the day.   I placed new referral with address of location that pt requested.  Pt also has some hotflashes intermittent. One year since last menses.   Pt last seen by gyn 02-24-2022.  Since last visit pt does report some neck pain and some upper ext//shoulder area pain.  Review of Systems  Constitutional:  Positive for fatigue.  Respiratory:  Negative for cough, chest tightness and shortness of breath.   Cardiovascular:  Negative for chest pain and palpitations.  Gastrointestinal:  Negative for abdominal pain.  Genitourinary:  Negative for dyspareunia, dysuria and flank pain.  Musculoskeletal:  Positive for arthralgias and myalgias.  Skin:  Negative for rash.  Neurological:  Negative for dizziness, weakness, numbness and headaches.    Past Medical History:  Diagnosis Date   Adhesive capsulitis of right shoulder 03/26/2021   Depression    Femur fracture ([facility]) 2006    Hypertension    Lumbar radiculopathy 01/16/2016   Migraine    Mood disorder ([facility])    Osteoarthritis of spine with radiculopathy, 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   Retained orthopedic hardware 01/16/2016   Right leg pain 07/16/2016   Segmental and somatic dysfunction of 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   Spasm of muscle 04/15/2016     Social History   Socioeconomic History   Marital status: Single    Spouse name: Not on file   Number of children: Not on file   Years of education: Not on file   Highest education level: Bachelor's degree (e.g., BA, AB, BS)  Occupational History   Not on file  Tobacco Use   Smoking status: Every Day    Packs/day: 0    Types: Cigars, Cigarettes   Smokeless tobacco: Never   Tobacco comments:    3 cigars with marijuiana  Vaping Use   Vaping Use: Never used  Substance and Sexual Activity   Alcohol use: No   Drug use: Yes    Types: Marijuana   Sexual activity: Yes    Birth control/protection: Other-see comments, Surgical  Other Topics Concern   Not on file  Social History Narrative   Not on file   Social Determinants of Health   Financial Resource Strain: Patient Declined (12/02/2022)   Overall Financial Resource Strain (CARDIA)    Difficulty of Paying Living Expenses: Patient declined  Food Insecurity: Patient Declined (  12/02/2022)   Hunger Vital Sign    Worried About Running Out of Food in the Last Year: Patient declined    Ran Out of Food in the Last Year: Patient declined  Transportation Needs: No Transportation Needs (12/02/2022)   PRAPARE - Administrator, Civil Service (Medical): No    Lack of Transportation (Non-Medical): No  Physical Activity: Sufficiently Active (12/02/2022)   Exercise Vital Sign    Days of Exercise per Week: 7 days    Minutes of Exercise per Session: 70 min  Stress: Stress Concern Present (12/02/2022)   [employer] of Occupational Health - Occupational Stress Questionnaire    Feeling of Stress : To some extent   Social Connections: Moderately Isolated (12/02/2022)   Social Connection and Isolation Panel [NHANES]    Frequency of Communication with Friends and Family: More than three times a week    Frequency of Social Gatherings with Friends and Family: Once a week    Attends Religious Services: Never    Database administrator or Organizations: No    Attends Engineer, structural: Not on file    Marital Status: Married  Catering manager Violence: Not on file    Past Surgical History:  Procedure Laterality Date   APPENDECTOMY     CHOLECYSTECTOMY     FEMUR FRACTURE SURGERY Right 2006   FEMUR IM ROD REMOVAL Right 03/11/2016   TUBAL LIGATION      Family History  Problem Relation Age of Onset   Hypertension Mother    Hypertension Maternal Aunt    Hypertension Paternal Uncle    Hypertension Maternal Grandmother    Hypertension Maternal Grandfather     Allergies  Allergen Reactions   Tramadol Shortness Of Breath and Nausea And Vomiting   Hydrocodone-Acetaminophen Other (See Comments)    Sweats, heavy breathing   Acetaminophen-Codeine Nausea Only    Current Outpatient Medications on File Prior to Visit  Medication Sig Dispense Refill   acetaminophen (TYLENOL) 500 MG tablet Take 1,000 mg by mouth every 6 (six) hours as needed for mild pain or headache.     albuterol (VENTOLIN HFA) 108 (90 Base) MCG/ACT inhaler INHALE 2 PUFFS INTO THE LUNGS EVERY 6 HOURS AS NEEDED FOR WHEEZING OR SHORTNESS OF BREATH 18 g 0   ALPRAZolam (XANAX) 0.5 MG tablet 1 tab po30 minutes  prior to dental procedure for anxiety 3 tablet 0   amLODipine (NORVASC) 10 MG tablet 1 tab po q day 90 tablet 3   butalbital-aspirin-caffeine (FIORINAL) 50-325-40 MG capsule Take 1 capsule by mouth every 6 (six) hours as needed for headache. 12 capsule 0   fluticasone (FLONASE) 50 MCG/ACT nasal spray Place 2 sprays into both nostrils daily. 16 g 1   ibuprofen (ADVIL) 800 MG tablet TAKE 1 TABLET(800 MG) BY MOUTH EVERY 8 HOURS  AS NEEDED FOR MODERATE PAIN 30 tablet 0   losartan (COZAAR) 25 MG tablet Take 1 tablet (25 mg total) by mouth daily. 30 tablet 11   methylPREDNISolone (MEDROL) 4 MG tablet Take 1 tablet (4 mg total) by mouth daily. 21 tablet 0   metoprolol tartrate (LOPRESSOR) 50 MG tablet Take 50 mg Lopressor 2 hours prior to your CT scan for a heart rate greater than 55. 1 tablet 0   nitroGLYCERIN (NITROSTAT) 0.4 MG SL tablet Place 1 tablet (0.4 mg total) under the tongue every 5 (five) minutes as needed. 25 tablet 6   ondansetron (ZOFRAN-ODT) 8 MG disintegrating tablet DISSOLVE 1 TABLET(8 MG) ON  THE TONGUE EVERY 8 HOURS AS NEEDED FOR NAUSEA OR VOMITING 15 tablet 0   SUMAtriptan (IMITREX) 50 MG tablet TAKE 1 TABLET BY MOUTH AT ONSET OF MIGRAINE. MAY REPEAT IN 2 HOURS IF HEADACHE PERSISTS OR RECURS 10 tablet 0   tiZANidine (ZANAFLEX) 4 MG tablet Take 1 tablet (4 mg total) by mouth every 6 (six) hours as needed for muscle spasms. 30 tablet 0   No current facility-administered medications on file prior to visit.    BP 128/77   Pulse 76   Temp 98 F (36.7 C)   Resp 18   Ht 4\' 10"  (1.473 m)   Wt 169 lb 3.2 oz (76.7 kg)   SpO2 100%   BMI 35.36 kg/m        Objective:   Physical Exam  General Mental Status- Alert. General Appearance- Not in acute distress.   Skin General: Color- Normal Color. Moisture- Normal Moisture.  Neck Carotid Arteries- Normal color. Moisture- Normal Moisture. No carotid bruits. No JVD.  Chest and Lung Exam Auscultation: Breath Sounds:-Normal.  Cardiovascular Auscultation:Rythm- Regular. Murmurs & Other Heart Sounds:Auscultation of the heart reveals- No Murmurs.  Abdomen Inspection:-Inspeection Normal. Palpation/Percussion:Note:No mass. Palpation and Percussion of the abdomen reveal- Non Tender, Non Distended + BS, no rebound or guarding.   Neurologic Cranial Nerve exam:- CN III-XII intact(No nystagmus), symmetric smile. Strength:- 5/5 equal and symmetric strength  both upper and lower extremities.       Assessment & Plan:   Patient Instructions  1. Arthralgia, unspecified joint. New referral placed. Ask you call the office directly where you want to be seen. Let me now if by next Tuesday they don't the referral.   - Ambulatory referral to Rheumatology  2. Positive ANA (antinuclear antibody) - Ambulatory referral to Rheumatology  3. Hot flashes - FSH; Future - CBC w/Diff  4. Fatigue, unspecified type Please schedule the future labs.  - TSH; Future - B12; Future - Vitamin B1; Future - CBC w/Diff; Future - Comp Met (CMET); Future  5. Bruising - CBC w/Diff;(future lab)  New neck pain with some upper extremity pain. Will place xray order c spine.  On discussion for above pending referral labs and xray rx taper prednisone.  Follow up date to be determined after lab and xray review.   Esperanza RichtersEdward Aarion Kittrell, PA-C

## 2022-12-18 ENCOUNTER — Other Ambulatory Visit (INDEPENDENT_AMBULATORY_CARE_PROVIDER_SITE_OTHER): Payer: Medicaid Other

## 2022-12-18 DIAGNOSIS — R232 Flushing: Secondary | ICD-10-CM

## 2022-12-18 DIAGNOSIS — R5383 Other fatigue: Secondary | ICD-10-CM

## 2022-12-18 LAB — COMPREHENSIVE METABOLIC PANEL
ALT: 13 U/L (ref 0–35)
AST: 17 U/L (ref 0–37)
Albumin: 4.5 g/dL (ref 3.5–5.2)
Alkaline Phosphatase: 81 U/L (ref 39–117)
BUN: 10 mg/dL (ref 6–23)
CO2: 27 mEq/L (ref 19–32)
Calcium: 9.8 mg/dL (ref 8.4–10.5)
Chloride: 104 mEq/L (ref 96–112)
Creatinine, Ser: 0.77 mg/dL (ref 0.40–1.20)
GFR: 91.74 mL/min (ref >60.00)
Glucose, Bld: 61 mg/dL — ABNORMAL LOW (ref 70–99)
Potassium: 3.7 mEq/L (ref 3.5–5.1)
Sodium: 141 mEq/L (ref 135–145)
Total Bilirubin: 0.4 mg/dL (ref 0.2–1.2)
Total Protein: 7.6 g/dL (ref 6.0–8.3)

## 2022-12-18 LAB — CBC WITH DIFFERENTIAL/PLATELET
Basophils Absolute: 0 10*3/uL (ref 0.0–0.1)
Basophils Relative: 0.3 % (ref 0.0–3.0)
Eosinophils Absolute: 0.1 10*3/uL (ref 0.0–0.7)
Eosinophils Relative: 1 % (ref 0.0–5.0)
HCT: 39.5 % (ref 36.0–46.0)
Hemoglobin: 13.1 g/dL (ref 12.0–15.0)
Lymphocytes Relative: 32.1 % (ref 12.0–46.0)
Lymphs Abs: 2.6 10*3/uL (ref 0.7–4.0)
MCHC: 33.2 g/dL (ref 30.0–36.0)
MCV: 80.9 fl (ref 78.0–100.0)
Monocytes Absolute: 0.4 10*3/uL (ref 0.1–1.0)
Monocytes Relative: 5.5 % (ref 3.0–12.0)
Neutro Abs: 4.9 10*3/uL (ref 1.4–7.7)
Neutrophils Relative %: 61.1 % (ref 43.0–77.0)
Platelets: 253 10*3/uL (ref 150.0–400.0)
RBC: 4.88 Mil/uL (ref 3.87–5.11)
RDW: 14.4 % (ref 11.5–15.5)
WBC: 8 10*3/uL (ref 4.0–10.5)

## 2022-12-21 ENCOUNTER — Encounter: Payer: Self-pay | Admitting: *Deleted

## 2022-12-21 LAB — VITAMIN B12: Vitamin B-12: 304 pg/mL (ref 211–911)

## 2022-12-21 LAB — TSH: TSH: 0.8 u[IU]/mL (ref 0.35–5.50)

## 2022-12-21 LAB — VITAMIN B1: Vitamin B1 (Thiamine): <6 nmol/L — ABNORMAL LOW (ref 8–30)

## 2022-12-21 LAB — FOLLICLE STIMULATING HORMONE: FSH: 79.7 m[IU]/mL

## 2023-01-14 ENCOUNTER — Other Ambulatory Visit: Payer: Self-pay | Admitting: Medical

## 2023-01-14 ENCOUNTER — Telehealth: Payer: Self-pay | Admitting: Medical

## 2023-01-14 MED ORDER — LOSARTAN POTASSIUM 25 MG PO TABS
25.0000 mg | ORAL_TABLET | Freq: Every day | ORAL | 11 refills | Status: DC
  Filled 2023-01-14: qty 30, 30d supply, fill #0

## 2023-01-14 NOTE — Telephone Encounter (Signed)
Patient states she went to pick up her losartan and they told her that her insurance will not cover the medicine anymore. She would like know what to do. Please advise.

## 2023-01-14 NOTE — Addendum Note (Signed)
Addended by: Gwenevere Abbot on: 01/14/2023 06:03 PM   Modules accepted: Orders

## 2023-01-15 ENCOUNTER — Other Ambulatory Visit (HOSPITAL_BASED_OUTPATIENT_CLINIC_OR_DEPARTMENT_OTHER): Payer: Self-pay

## 2023-01-15 NOTE — Telephone Encounter (Signed)
Its $5 at our pharmacy   Spoke with pt , she stated that [facility] contacted her this morning and told her the script was ready for pick up , made her aware if insurance isnt covering it she can pick it up at the [facility]

## 2023-01-26 ENCOUNTER — Other Ambulatory Visit (HOSPITAL_BASED_OUTPATIENT_CLINIC_OR_DEPARTMENT_OTHER): Payer: Self-pay

## 2023-01-29 DIAGNOSIS — F1290 Cannabis use, unspecified, uncomplicated: Secondary | ICD-10-CM

## 2023-01-29 HISTORY — DX: Cannabis use, unspecified, uncomplicated: F12.90

## 2023-02-03 ENCOUNTER — Encounter: Payer: Self-pay | Admitting: Medical

## 2023-02-05 NOTE — Telephone Encounter (Signed)
Forms picked up

## 2023-02-10 ENCOUNTER — Other Ambulatory Visit: Payer: Self-pay | Admitting: Medical

## 2023-02-10 NOTE — Telephone Encounter (Signed)
Refill of ibuprofen sent to patient's pharmacy.

## 2023-03-29 ENCOUNTER — Other Ambulatory Visit: Payer: Self-pay | Admitting: Medical

## 2023-03-29 NOTE — Telephone Encounter (Signed)
Rx refill ibuprofen sent to pharmacy.

## 2023-03-31 LAB — LAB REPORT - SCANNED: eGFR: 90

## 2023-04-01 ENCOUNTER — Ambulatory Visit: Payer: Medicaid Other | Admitting: Family Medicine

## 2023-04-01 ENCOUNTER — Encounter: Payer: Self-pay | Admitting: Family Medicine

## 2023-04-01 VITALS — BP 124/63 | HR 78 | Temp 98.0°F | Ht <= 58 in | Wt 175.6 lb

## 2023-04-01 DIAGNOSIS — M25532 Pain in left wrist: Secondary | ICD-10-CM

## 2023-04-01 DIAGNOSIS — M79671 Pain in right foot: Secondary | ICD-10-CM | POA: Diagnosis not present

## 2023-04-01 DIAGNOSIS — E876 Hypokalemia: Secondary | ICD-10-CM

## 2023-04-01 DIAGNOSIS — M25531 Pain in right wrist: Secondary | ICD-10-CM | POA: Diagnosis not present

## 2023-04-01 MED ORDER — POTASSIUM CHLORIDE CRYS ER 20 MEQ PO TBCR: 20.0000 meq | EXTENDED_RELEASE_TABLET | Freq: Two times a day (BID) | ORAL | 0 refills | Status: DC

## 2023-04-01 MED ORDER — MELOXICAM 15 MG PO TABS
15.0000 mg | ORAL_TABLET | Freq: Every day | ORAL | 0 refills | Status: DC
Start: 2023-04-01 — End: 2023-05-03

## 2023-04-01 NOTE — Patient Instructions (Signed)
For your wrist pain: - try sleeping in the wrist brace - Voltaren gel - Meloxicam should help this as well  Foot pain: - sounds like some plantar fasciitis pain - start Meloxicam - ice, massage, supportive shoes, stretching - Sports Med referral placed  Continue following with rheumatology to further investigate as well

## 2023-04-01 NOTE — Progress Notes (Signed)
**Note Phyllis-Identified via Obfuscation** Acute Office Visit  Subjective:     Patient ID: Phyllis Adams, female    DOB: Jun 04, 1976, 47 y.o.   MRN: [id number]  Chief Complaint  Patient presents with   hand and foot pain bilateral    Went to rheumatology    ER follow up- [facility]    HPI Patient is in today for joint pain.  Discussed the use of AI scribe software for clinical note transcription with the patient, who gave verbal consent to proceed.  History of Present Illness   The patient, with a history of sciatica in the right leg, presents with joint pain that began in June. Initially, she experienced aching and a burning sensation in the right lower arm, radiating from the forearm to the fingers, accompanied by tingling. Lab work revealed a positive ANA, leading to a referral to a rheumatologist. She was prescribed a prednisone pack, which was discontinued due to gastrointestinal side effects.  Subsequently, he developed foot pain, predominantly in the bottom right foot, which has been ongoing for approximately a month. The pain is described as aching, burning, and tingling. She attempted to alleviate the discomfort by changing footwear but found no relief. The pain is severe enough to affect her gait, causing her to walk on her toes, which has not led to discomfort dorsally.  She also reports similar aching and burning sensations in the hands, with tingling and a burning sensation in the fingers. Her occupation involves significant use of his hands and being on feet for part of the day.            ROS All review of systems negative except what is listed in the HPI      Objective:    BP 124/63 (BP Location: Right Arm, Cuff Size: Normal)   Pulse 78   Temp 98 F (36.7 C) (Oral)   Ht 4\' 10"  (1.473 m)   Wt 175 lb 9.6 oz (79.7 kg)   SpO2 100%   BMI 36.70 kg/m    Physical Exam Vitals reviewed.  Constitutional:      Appearance: Normal appearance.  Musculoskeletal:        General: No swelling.  Normal range of motion.     Comments: Negative Tinel/phalen bilaterally Right foot with pain to palpation of plantar fascia   Skin:    Findings: No bruising or erythema.  Neurological:     Mental Status: She is alert and oriented to person, place, and time.  Psychiatric:        Mood and Affect: Mood normal.        Behavior: Behavior normal.        Thought Content: Thought content normal.        Judgment: Judgment normal.        Results for orders placed or performed in visit on 04/01/23  Lab report - scanned  Result Value Ref Range   eGFR 90         Assessment & Plan:   Problem List Items Addressed This Visit   None Visit Diagnoses     Right foot pain    -  Primary - sounds like some plantar fasciitis pain - start Meloxicam - ice, massage, supportive shoes, stretching - Sports Med referral placed    Relevant Medications   meloxicam (MOBIC) 15 MG tablet   Other Relevant Orders   Ambulatory referral to Sports Medicine   Pain in both wrists     - try sleeping in the wrist  brace - Voltaren gel - Meloxicam should help this as well   Relevant Medications   meloxicam (MOBIC) 15 MG tablet   Other Relevant Orders   Ambulatory referral to Sports Medicine   Hypokalemia     Short course of oral replacement. Asymptomatic.    Relevant Medications   potassium chloride SA (KLOR-CON M) 20 MEQ tablet       Meds ordered this encounter  Medications   meloxicam (MOBIC) 15 MG tablet    Sig: Take 1 tablet (15 mg total) by mouth daily.    Dispense:  30 tablet    Refill:  0    Order Specific Question:   Supervising Provider    Answer:   Danise Edge A [[id number]]   potassium chloride SA (KLOR-CON M) 20 MEQ tablet    Sig: Take 1 tablet (20 mEq total) by mouth 2 (two) times daily for 3 days.    Dispense:  6 tablet    Refill:  0    Order Specific Question:   Supervising Provider    Answer:   Danise Edge A [[id number]]    Return if symptoms worsen or fail to improve.  Clayborne Dana, NP

## 2023-04-02 ENCOUNTER — Ambulatory Visit: Payer: Medicaid Other | Admitting: Medical

## 2023-04-16 ENCOUNTER — Encounter: Payer: Self-pay | Admitting: Medical

## 2023-04-21 ENCOUNTER — Ambulatory Visit: Payer: Medicaid Other | Admitting: Family Medicine

## 2023-05-01 ENCOUNTER — Other Ambulatory Visit: Payer: Self-pay | Admitting: Family Medicine

## 2023-05-01 DIAGNOSIS — M25531 Pain in right wrist: Secondary | ICD-10-CM

## 2023-05-01 DIAGNOSIS — M79671 Pain in right foot: Secondary | ICD-10-CM

## 2023-05-03 ENCOUNTER — Other Ambulatory Visit: Payer: Self-pay | Admitting: Medical

## 2023-05-11 ENCOUNTER — Encounter: Payer: Medicaid Other | Admitting: Medical

## 2023-05-11 ENCOUNTER — Ambulatory Visit: Payer: Medicaid Other | Admitting: Medical

## 2023-05-19 ENCOUNTER — Ambulatory Visit (INDEPENDENT_AMBULATORY_CARE_PROVIDER_SITE_OTHER): Payer: Medicaid Other | Admitting: Sports Medicine

## 2023-05-19 ENCOUNTER — Ambulatory Visit (INDEPENDENT_AMBULATORY_CARE_PROVIDER_SITE_OTHER): Payer: Medicaid Other | Admitting: Medical

## 2023-05-19 ENCOUNTER — Encounter: Payer: Self-pay | Admitting: Medical

## 2023-05-19 ENCOUNTER — Encounter: Payer: Self-pay | Admitting: Sports Medicine

## 2023-05-19 ENCOUNTER — Encounter: Payer: Self-pay | Admitting: Neurology

## 2023-05-19 VITALS — BP 138/86 | Ht <= 58 in | Wt 173.0 lb

## 2023-05-19 VITALS — BP 140/85 | HR 66 | Resp 18 | Ht <= 58 in | Wt 175.6 lb

## 2023-05-19 DIAGNOSIS — R232 Flushing: Secondary | ICD-10-CM | POA: Diagnosis not present

## 2023-05-19 DIAGNOSIS — Z1231 Encounter for screening mammogram for malignant neoplasm of breast: Secondary | ICD-10-CM

## 2023-05-19 DIAGNOSIS — G5730 Lesion of lateral popliteal nerve, unspecified lower limb: Secondary | ICD-10-CM

## 2023-05-19 DIAGNOSIS — Z0000 Encounter for general adult medical examination without abnormal findings: Secondary | ICD-10-CM | POA: Diagnosis not present

## 2023-05-19 DIAGNOSIS — Z1211 Encounter for screening for malignant neoplasm of colon: Secondary | ICD-10-CM | POA: Diagnosis not present

## 2023-05-19 MED ORDER — IBUPROFEN 600 MG PO TABS: 600.0000 mg | ORAL_TABLET | Freq: Three times a day (TID) | ORAL | 0 refills | Status: DC | PRN

## 2023-05-19 MED ORDER — IBUPROFEN 800 MG PO TABS: 800.0000 mg | ORAL_TABLET | Freq: Three times a day (TID) | ORAL | 0 refills | Status: DC | PRN

## 2023-05-19 MED ORDER — ONDANSETRON 8 MG PO TBDP: ORAL_TABLET | ORAL | 0 refills | Status: DC

## 2023-05-19 MED ORDER — PAROXETINE HCL 10 MG PO TABS: 10.0000 mg | ORAL_TABLET | Freq: Every day | ORAL | 0 refills | Status: DC

## 2023-05-19 NOTE — Addendum Note (Signed)
Addended by: Gwenevere Abbot on: 05/19/2023 01:34 PM   Modules accepted: Orders

## 2023-05-19 NOTE — Progress Notes (Signed)
   Subjective:    Patient ID: Phyllis Adams, female    DOB: Jul 10, 1976, 47 y.o.   MRN: [id number]  HPI chief complaint: Left wrist and bilateral foot pain  Patient is a very pleasant 47 year old right-hand-dominant female that presents today complaining of left wrist and bilateral lower extremity pain.  Her pain initially began 2 months ago along the ulnar aspect of the wrist.  No injury that she can recall.  Her symptoms are worse with activity.  She describes a burning sensation along the ulnar aspect of the wrist but does not extend into the hand.  About 3 weeks later she began to develop some pain as well as numbness and tingling in both lower leg and feet.  This is also associated with pain.  She states that any light touch along the skin will reproduce her symptoms.  She endorses numbness and tingling in the feet.  She has tried several different types of inserts with some relief.  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  She was given a prescription for prednisone but did not tolerate it due to GI upset.  She was also referred to rheumatology for workup there for rheumatological causes of her symptoms were negative.  Past medical history reviewed Medications reviewed Allergies reviewed    Review of Systems As above    Objective:   Physical Exam  Well-developed, well-nourished.  No acute distress  Examination of the left wrist shows full range of motion.  No effusion.  No soft tissue swelling.  No significant tenderness to palpation.  Examination of both lower legs shows a positive Tinel's in the area of the superficial peroneal nerve bilaterally.  Good strength.  No swelling.      Assessment & Plan:   Bilateral lower leg numbness and tingling-question superficial peroneal nerve neuritis  Patient will be referred to neurology for workup.  I am not sure if the left wrist pain is associated with the bilateral lower leg pain but her symptoms are  consistent with some type of neuropathy or neuropathic pain.  I do not believe that there is any orthopedic cause for her symptoms.  She will follow-up with me as needed.  This note was dictated using Dragon naturally speaking software and may contain errors in syntax, spelling, or content which have not been identified prior to signing this note.

## 2023-05-19 NOTE — Progress Notes (Signed)
Subjective:    Patient ID: Phyllis Adams, female    DOB: 06/19/1976, 47 y.o.   MRN: [id number]  HPI  Pt in for follow cpe/wellness exam.   Pt is fasting. 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 Pt states diet improved/better. Avoiding fried foods and not eating pork. States eats more vegetable, salads and potatoes. Mountain dew 2-3 a day. Also states some Mcdonads sweet tea. Rare smoking now 1.5 cigarette a day. No alcohol.  Pt states she got notice that papsmear due. She will get scheduled.  Pt declines vaccines. Advised tdap and flu vaccine.   Pt never had a mammogram.  No colonoscopy in the past.  Pt has hotflashes and she states her former gyn did not want to give any med for hotflashes. Fsh was elevated in past. Pt tried black cohash in past. Only helped briefly. Pt states former gyn did not want to prescribe any hormone therapy.   Review of Systems  Constitutional:  Negative for chills, fatigue and fever.  HENT:  Negative for congestion and drooling.   Respiratory:  Negative for cough, chest tightness, shortness of breath and wheezing.   Cardiovascular:  Negative for chest pain and palpitations.  Gastrointestinal:  Negative for abdominal distention, abdominal pain, blood in stool and constipation.  Genitourinary:  Negative for difficulty urinating, dysuria, enuresis and frequency.  Skin:  Negative for rash.  Psychiatric/Behavioral:  Negative for behavioral problems, dysphoric mood and sleep disturbance. The patient is not nervous/anxious.     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   Retained orthopedic hardware 01/16/2016   Right leg pain 07/16/2016   Segmental and somatic dysfunction of 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   Spasm of muscle  04/15/2016     Social History   Socioeconomic History   Marital status: Single    Spouse name: Not on file   Number of children: Not on file   Years of education: Not on file   Highest education level: Bachelor's degree (e.g., BA, AB, BS)  Occupational History   Not on file  Tobacco Use   Smoking status: Every Day    Types: Cigars, Cigarettes   Smokeless tobacco: Never   Tobacco comments:    3 cigars with marijuiana  Vaping Use   Vaping status: Never Used  Substance and Sexual Activity   Alcohol use: No   Drug use: Yes    Types: Marijuana   Sexual activity: Yes    Birth control/protection: Other-see comments, Surgical  Other Topics Concern   Not on file  Social History Narrative   Not on file   Social Determinants of Health   Financial Resource Strain: Patient Declined (12/02/2022)   Overall Financial Resource Strain (CARDIA)    Difficulty of Paying Living Expenses: Patient declined  Food Insecurity: Medium Risk (04/01/2023)   Received from [facility]   Hunger Vital Sign    Worried About Running Out of Food in the Last Year: Sometimes true    Ran Out of Food in the Last Year: Sometimes true  Transportation Needs: Not on file (04/01/2023)  Physical Activity: Sufficiently Active (12/02/2022)   Exercise Vital Sign    Days of Exercise per Week: 7 days    Minutes of Exercise per  Session: 70 min  Stress: Stress Concern Present (12/02/2022)   [employer] of Occupational Health - Occupational Stress Questionnaire    Feeling of Stress : To some extent  Social Connections: Moderately Isolated (12/02/2022)   Social Connection and Isolation Panel [NHANES]    Frequency of Communication with Friends and Family: More than three times a week    Frequency of Social Gatherings with Friends and Family: Once a week    Attends Religious Services: Never    Database administrator or Organizations: No    Attends Engineer, structural: Not on file    Marital Status: Married   Catering manager Violence: Not on file    Past Surgical History:  Procedure Laterality Date   APPENDECTOMY     CHOLECYSTECTOMY     FEMUR FRACTURE SURGERY Right 2006   FEMUR IM ROD REMOVAL Right 03/11/2016   TUBAL LIGATION      Family History  Problem Relation Age of Onset   Hypertension Mother    Hypertension Maternal Aunt    Hypertension Paternal Uncle    Hypertension Maternal Grandmother    Hypertension Maternal Grandfather     Allergies  Allergen Reactions   Tramadol Shortness Of Breath and Nausea And Vomiting   Hydrocodone-Acetaminophen Other (See Comments)    Sweats, heavy breathing   Acetaminophen-Codeine Nausea Only    Current Outpatient Medications on File Prior to Visit  Medication Sig Dispense Refill   acetaminophen (TYLENOL) 500 MG tablet Take 1,000 mg by mouth every 6 (six) hours as needed for mild pain or headache.     albuterol (VENTOLIN HFA) 108 (90 Base) MCG/ACT inhaler INHALE 2 PUFFS INTO THE LUNGS EVERY 6 HOURS AS NEEDED FOR WHEEZING OR SHORTNESS OF BREATH 18 g 0   ALPRAZolam (XANAX) 0.5 MG tablet 1 tab po30 minutes  prior to dental procedure for anxiety 3 tablet 0   amLODipine (NORVASC) 10 MG tablet 1 tab po q day 90 tablet 3   butalbital-aspirin-caffeine (FIORINAL) 50-325-40 MG capsule Take 1 capsule by mouth every 6 (six) hours as needed for headache. 12 capsule 0   fluticasone (FLONASE) 50 MCG/ACT nasal spray Place 2 sprays into both nostrils daily. 16 g 1   losartan (COZAAR) 25 MG tablet Take 1 tablet (25 mg total) by mouth daily. 30 tablet 11   nitroGLYCERIN (NITROSTAT) 0.4 MG SL tablet Place 1 tablet (0.4 mg total) under the tongue every 5 (five) minutes as needed. 25 tablet 6   ondansetron (ZOFRAN-ODT) 8 MG disintegrating tablet DISSOLVE 1 TABLET(8 MG) ON THE TONGUE EVERY 8 HOURS AS NEEDED FOR NAUSEA OR VOMITING 15 tablet 0   potassium chloride SA (KLOR-CON M) 20 MEQ tablet Take 1 tablet (20 mEq total) by mouth 2 (two) times daily for 3 days. 6  tablet 0   predniSONE (STERAPRED UNI-PAK 21 TAB) 10 MG (21) TBPK tablet Standard Taper over 6 days. 21 tablet 0   SUMAtriptan (IMITREX) 50 MG tablet TAKE 1 TABLET BY MOUTH AT ONSET OF MIGRAINE. MAY REPEAT IN 2 HOURS IF HEADACHE PERSISTS OR RECURS 10 tablet 2   tiZANidine (ZANAFLEX) 4 MG tablet Take 1 tablet (4 mg total) by mouth every 6 (six) hours as needed for muscle spasms. 30 tablet 0   No current facility-administered medications on file prior to visit.    BP (!) 140/85   Pulse 66   Resp 18   Ht 4\' 10"  (1.473 m)   Wt 175 lb 9.6 oz (79.7 kg)   SpO2  100%   BMI 36.70 kg/m        Objective:   Physical Exam  General Mental Status- Alert. General Appearance- Not in acute distress.   Skin General: Color- Normal Color. Moisture- Normal Moisture.  Neck Carotid Arteries- Normal color. Moisture- Normal Moisture. No carotid bruits. No JVD.  Chest and Lung Exam Auscultation: Breath Sounds:-Normal.  Cardiovascular Auscultation:Rythm- Regular. Murmurs & Other Heart Sounds:Auscultation of the heart reveals- No Murmurs.  Abdomen Inspection:-Inspeection Normal. Palpation/Percussion:Note:No mass. Palpation and Percussion of the abdomen reveal- Non Tender, Non Distended + BS, no rebound or guarding.   Neurologic Cranial Nerve exam:- CN III-XII intact(No nystagmus), symmetric smile. Strength:- 5/5 equal and symmetric strength both upper and lower extremities.       Assessment & Plan:   Patient Instructions  For you wellness exam today I have ordered cbc, cmp and lipid panel.  Vaccine declined. Advised on benefit. If you do want can get thru pharmacy or health dept.  Referral to gastroenterologist placed for colonoscopy.  Recommend exercise and healthy diet.  We will let you know lab results as they come in.  Follow up one month or sooner if needed  Htn- bp borderline but did not take amlodipine or losartan today.  Hot flashes with high fsh- on discussion decided  would try paxil 10 mg daily.       [place], [place]   [id number] charge as did address and discuss hot flahses and high fsh. Rx paxil and will see if this helps.

## 2023-05-19 NOTE — Addendum Note (Signed)
Addended by: Gwenevere Abbot on: 05/19/2023 05:28 PM   Modules accepted: Orders

## 2023-05-19 NOTE — Addendum Note (Signed)
Addended by: Gwenevere Abbot on: 05/19/2023 05:30 PM   Modules accepted: Orders

## 2023-05-19 NOTE — Patient Instructions (Addendum)
For you wellness exam today I have ordered cbc, cmp and lipid panel.  Vaccine declined. Advised on benefit. If you do want can get thru pharmacy or health dept.  Referral to gastroenterologist placed for colonoscopy.  Keep gyn appointment.  Placed mammogram order.  Recommend exercise and healthy diet.  We will let you know lab results as they come in.  Follow up one month or sooner if needed  Htn- bp borderline but did not take amlodipine or losartan today.  Hot flashes with high fsh- on discussion decided would try paxil 10 mg daily.   Preventive Care 74-48 Years Old, Female Preventive care refers to lifestyle choices and visits with your health care provider that can promote health and wellness. Preventive care visits are also called wellness exams. What can I expect for my preventive care visit? Counseling Your health care provider may ask you questions about your: Medical history, including: Past medical problems. Family medical history. Pregnancy history. Current health, including: Menstrual cycle. Method of birth control. Emotional well-being. Home life and relationship well-being. Sexual activity and sexual health. Lifestyle, including: Alcohol, nicotine or tobacco, and drug use. Access to firearms. Diet, exercise, and sleep habits. Work and work Astronomer. Sunscreen use. Safety issues such as seatbelt and bike helmet use. Physical exam Your health care provider will check your: Height and weight. These may be used to calculate your BMI (body mass index). BMI is a measurement that tells if you are at a healthy weight. Waist circumference. This measures the distance around your waistline. This measurement also tells if you are at a healthy weight and may help predict your risk of certain diseases, such as type 2 diabetes and high blood pressure. Heart rate and blood pressure. Body temperature. Skin for abnormal spots. What immunizations do I need?  Vaccines  are usually given at various ages, according to a schedule. Your health care provider will recommend vaccines for you based on your age, medical history, and lifestyle or other factors, such as travel or where you work. What tests do I need? Screening Your health care provider may recommend screening tests for certain conditions. This may include: Lipid and cholesterol levels. Diabetes screening. This is done by checking your blood sugar (glucose) after you have not eaten for a while (fasting). Pelvic exam and Pap test. Hepatitis B test. Hepatitis C test. HIV (human immunodeficiency virus) test. STI (sexually transmitted infection) testing, if you are at risk. Lung cancer screening. Colorectal cancer screening. Mammogram. Talk with your health care provider about when you should start having regular mammograms. This may depend on whether you have a family history of breast cancer. BRCA-related cancer screening. This may be done if you have a family history of breast, ovarian, tubal, or peritoneal cancers. Bone density scan. This is done to screen for osteoporosis. Talk with your health care provider about your test results, treatment options, and if necessary, the need for more tests. Follow these instructions at home: Eating and drinking  Eat a diet that includes fresh fruits and vegetables, whole grains, lean protein, and low-fat dairy products. Take vitamin and mineral supplements as recommended by your health care provider. Do not drink alcohol if: Your health care provider tells you not to drink. You are pregnant, may be pregnant, or are planning to become pregnant. If you drink alcohol: Limit how much you have to 0-1 drink a day. Know how much alcohol is in your drink. In the [place] one drink equals one 12 oz bottle of beer (  355 mL), one 5 oz glass of wine (148 mL), or one 1 oz glass of hard liquor (44 mL). Lifestyle Brush your teeth every morning and night with fluoride  toothpaste. Floss one time each day. Exercise for at least 30 minutes 5 or more days each week. Do not use any products that contain nicotine or tobacco. These products include cigarettes, chewing tobacco, and vaping devices, such as e-cigarettes. If you need help quitting, ask your health care provider. Do not use drugs. If you are sexually active, practice safe sex. Use a condom or other form of protection to prevent STIs. If you do not wish to become pregnant, use a form of birth control. If you plan to become pregnant, see your health care provider for a prepregnancy visit. Take aspirin only as told by your health care provider. Make sure that you understand how much to take and what form to take. Work with your health care provider to find out whether it is safe and beneficial for you to take aspirin daily. Find healthy ways to manage stress, such as: Meditation, yoga, or listening to music. Journaling. Talking to a trusted person. Spending time with friends and family. Minimize exposure to UV radiation to reduce your risk of skin cancer. Safety Always wear your seat belt while driving or riding in a vehicle. Do not drive: If you have been drinking alcohol. Do not ride with someone who has been drinking. When you are tired or distracted. While texting. If you have been using any mind-altering substances or drugs. Wear a helmet and other protective equipment during sports activities. If you have firearms in your house, make sure you follow all gun safety procedures. Seek help if you have been physically or sexually abused. What's next? Visit your health care provider once a year for an annual wellness visit. Ask your health care provider how often you should have your eyes and teeth checked. Stay up to date on all vaccines. This information is not intended to replace advice given to you by your health care provider. Make sure you discuss any questions you have with your health care  provider. Document Revised: 02/19/2021 Document Reviewed: 02/19/2021 Elsevier Patient Education  2024 [employer].

## 2023-05-19 NOTE — Addendum Note (Signed)
Addended by: Gwenevere Abbot on: 05/19/2023 02:51 PM   Modules accepted: Orders

## 2023-05-20 LAB — CBC WITH DIFFERENTIAL/PLATELET
Basophils Absolute: 0.1 10*3/uL (ref 0.0–0.1)
Basophils Relative: 0.8 % (ref 0.0–3.0)
Eosinophils Absolute: 0 10*3/uL (ref 0.0–0.7)
Eosinophils Relative: 0.6 % (ref 0.0–5.0)
HCT: 40.1 % (ref 36.0–46.0)
Hemoglobin: 12.6 g/dL (ref 12.0–15.0)
Lymphocytes Relative: 32.7 % (ref 12.0–46.0)
Lymphs Abs: 2.5 10*3/uL (ref 0.7–4.0)
MCHC: 31.3 g/dL (ref 30.0–36.0)
MCV: 83.1 fl (ref 78.0–100.0)
Monocytes Absolute: 0.6 10*3/uL (ref 0.1–1.0)
Monocytes Relative: 7.3 % (ref 3.0–12.0)
Neutro Abs: 4.4 10*3/uL (ref 1.4–7.7)
Neutrophils Relative %: 58.6 % (ref 43.0–77.0)
Platelets: 271 10*3/uL (ref 150.0–400.0)
RBC: 4.83 Mil/uL (ref 3.87–5.11)
RDW: 14.6 % (ref 11.5–15.5)
WBC: 7.6 10*3/uL (ref 4.0–10.5)

## 2023-05-20 LAB — LIPID PANEL
Cholesterol: 131 mg/dL (ref 0–200)
HDL: 47.7 mg/dL (ref >39.00)
LDL Cholesterol: 70 mg/dL (ref 0–99)
NonHDL: 83.11
Total CHOL/HDL Ratio: 3
Triglycerides: 64 mg/dL (ref 0.0–149.0)
VLDL: 12.8 mg/dL (ref 0.0–40.0)

## 2023-05-20 LAB — COMPREHENSIVE METABOLIC PANEL
ALT: 10 U/L (ref 0–35)
AST: 15 U/L (ref 0–37)
Albumin: 4.1 g/dL (ref 3.5–5.2)
Alkaline Phosphatase: 79 U/L (ref 39–117)
BUN: 11 mg/dL (ref 6–23)
CO2: 27 meq/L (ref 19–32)
Calcium: 9.8 mg/dL (ref 8.4–10.5)
Chloride: 106 meq/L (ref 96–112)
Creatinine, Ser: 0.72 mg/dL (ref 0.40–1.20)
GFR: 99.14 mL/min (ref >60.00)
Glucose, Bld: 65 mg/dL — ABNORMAL LOW (ref 70–99)
Potassium: 4.1 meq/L (ref 3.5–5.1)
Sodium: 140 meq/L (ref 135–145)
Total Bilirubin: 0.3 mg/dL (ref 0.2–1.2)
Total Protein: 7.1 g/dL (ref 6.0–8.3)

## 2023-05-25 ENCOUNTER — Other Ambulatory Visit: Payer: Self-pay | Admitting: Medical

## 2023-05-25 DIAGNOSIS — Z1231 Encounter for screening mammogram for malignant neoplasm of breast: Secondary | ICD-10-CM

## 2023-05-26 ENCOUNTER — Other Ambulatory Visit: Payer: Self-pay

## 2023-05-27 ENCOUNTER — Ambulatory Visit: Payer: Medicaid Other | Admitting: Cardiology

## 2023-06-01 DIAGNOSIS — Z1231 Encounter for screening mammogram for malignant neoplasm of breast: Secondary | ICD-10-CM

## 2023-06-11 ENCOUNTER — Encounter: Payer: Self-pay | Admitting: Obstetrics and Gynecology

## 2023-06-15 ENCOUNTER — Ambulatory Visit: Payer: Medicaid Other | Admitting: Neurology

## 2023-06-15 ENCOUNTER — Encounter: Payer: Self-pay | Admitting: Neurology

## 2023-06-15 VITALS — BP 122/82 | HR 67 | Ht <= 58 in | Wt 174.0 lb

## 2023-06-15 DIAGNOSIS — R202 Paresthesia of skin: Secondary | ICD-10-CM | POA: Diagnosis not present

## 2023-06-15 DIAGNOSIS — M79671 Pain in right foot: Secondary | ICD-10-CM

## 2023-06-15 DIAGNOSIS — M79604 Pain in right leg: Secondary | ICD-10-CM | POA: Diagnosis not present

## 2023-06-15 DIAGNOSIS — M79605 Pain in left leg: Secondary | ICD-10-CM | POA: Diagnosis not present

## 2023-06-15 DIAGNOSIS — M79672 Pain in left foot: Secondary | ICD-10-CM
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
## 2023-06-15 NOTE — Patient Instructions (Signed)
Nerve testing of the left arm and leg ° °ELECTROMYOGRAM AND NERVE CONDUCTION STUDIES (EMG/NCS) INSTRUCTIONS ° °How to Prepare °The neurologist conducting the EMG will need to know if you have certain medical conditions. Tell the neurologist and other EMG lab personnel if you: °Have a pacemaker or any other electrical medical device °Take blood-thinning medications °Have hemophilia, a blood-clotting disorder that causes prolonged bleeding °Bathing °Take a shower or bath shortly before your exam in order to remove oils from your skin. Don’t apply lotions or creams before the exam.  °What to Expect °You’ll likely be asked to change into a hospital gown for the procedure and lie down on an examination table. The following explanations can help you understand what will happen during the exam.  °Electrodes. The neurologist or a technician places surface electrodes at various locations on your skin depending on where you’re experiencing symptoms. Or the neurologist may insert needle electrodes at different sites depending on your symptoms.  °Sensations. The electrodes will at times transmit a tiny electrical current that you may feel as a twinge or spasm. The needle electrode may cause discomfort or pain that usually ends shortly after the needle is removed. °If you are concerned about discomfort or pain, you may want to talk to the neurologist about taking a short break during the exam.  °Instructions. During the needle EMG, the neurologist will assess whether there is any spontaneous electrical activity when the muscle is at rest - activity that isn’t present in healthy muscle tissue - and the degree of activity when you slightly contract the muscle.  °He or she will give you instructions on resting and contracting a muscle at appropriate times. Depending on what muscles and nerves the neurologist is examining, he or she may ask you to change positions during the exam.  °After your EMG °You may experience some temporary,  minor bruising where the needle electrode was inserted into your muscle. This bruising should fade within several days. If it persists, contact your primary care doctor.  ° °

## 2023-06-17 ENCOUNTER — Other Ambulatory Visit: Payer: Self-pay | Admitting: Medical

## 2023-06-25 ENCOUNTER — Ambulatory Visit: Payer: Medicaid Other | Admitting: Neurology

## 2023-06-25 DIAGNOSIS — R202 Paresthesia of skin: Secondary | ICD-10-CM

## 2023-06-25 DIAGNOSIS — M79605 Pain in left leg: Secondary | ICD-10-CM

## 2023-06-25 NOTE — Procedures (Signed)
[facility]  [address], [address]  [place], [place] [address] Tel: [phone] Fax: [phone] Test Date:  06/25/2023  Patient: Phyllis Adams DOB: November 05, 1975 Physician: Nita Sickle, DO  Sex: Female Height: 4\' 10"  Ref Phys: Nita Sickle, DO  ID#: [id number]   Technician:    History: This is a 47 year old female with generalized burning sensation over the left arm and bilateral legs.  NCV & EMG Findings: Extensive electrodiagnostic testing of the left upper and lower extremity shows:  Left median, ulnar, mixed palmar, sural, and superficial peroneal sensory responses are within normal limits. Left median, ulnar, peroneal, and tibial motor responses are within normal limits. Left tibial H reflex study is within normal limits. There is no evidence of active or chronic motor axonal loss changes affecting any of the tested muscles.  Motor unit configuration and recruitment pattern is within normal limits.  Impression: This is a normal study of the left upper and lower extremities.  In particular, there is no evidence of a large fiber sensorimotor polyneuropathy, cervical/lumbosacral radiculopathy, or carpal tunnel syndrome.   ___________________________ Nita Sickle, DO    Nerve Conduction Studies   Stim Site NR Peak (ms) Norm Peak (ms) O-P Amp (V) Norm O-P Amp  Left Median Anti Sensory (2nd Digit)  32 C  Wrist    3.1 <3.4 55.8 >20  Left Sup Peroneal Anti Sensory (Ant Lat Mall)  32 C  12 cm    1.9 <4.5 15.0 >5  Left Sural Anti Sensory (Lat Mall)  32 C  Calf    2.8 <4.5 17.8 >5  Left Ulnar Anti Sensory (5th Digit)  32 C  Wrist    3.1 <3.1 34.4 >12     Stim Site NR Onset (ms) Norm Onset (ms) O-P Amp (mV) Norm O-P Amp Site1 Site2 Delta-0 (ms) Dist (cm) Vel (m/s) Norm Vel (m/s)  Left Median Motor (Abd Poll Brev)  32 C  Wrist    2.9 <3.9 14.7 >6 Elbow Wrist 4.3 28.0 65 >50  Elbow    7.2  14.6         Left Peroneal Motor (Ext Dig Brev)  32 C  Ankle     2.3 <5.5 4.6 >3 B Fib Ankle 6.8 35.0 51 >40  B Fib    9.1  4.7  Poplt B Fib 1.4 8.0 57 >40  Poplt    10.5  4.6         Left Tibial Motor (Abd Hall Brev)  32 C  Ankle    3.4 <6.0 8.7 >8 Knee Ankle 6.3 40.0 63 >40  Knee    9.7  5.7         Left Ulnar Motor (Abd Dig Minimi)  32 C  Wrist    2.6 <3.1 8.3 >7 B Elbow Wrist 3.0 22.0 73 >50  B Elbow    5.6  7.8  A Elbow B Elbow 1.4 10.0 71 >50  A Elbow    7.0  7.6            Stim Site NR Peak (ms) Norm Peak (ms) P-T Amp (V) Site1 Site2 Delta-P (ms) Norm Delta (ms)  Left Median/Ulnar Palm Comparison (Wrist - 8cm)  32 C  Median Palm    1.5 <2.2 101.1 Median Palm Ulnar Palm 0.1   Ulnar Palm    1.4 <2.2 21.3       Electromyography   Side Muscle Ins.Act Fibs Fasc Recrt Amp Dur Poly Activation Comment  Left 1stDorInt  Nml Nml Nml Nml Nml Nml Nml Nml N/A  Left PronatorTeres Nml Nml Nml Nml Nml Nml Nml Nml N/A  Left Biceps Nml Nml Nml Nml Nml Nml Nml Nml N/A  Left Triceps Nml Nml Nml Nml Nml Nml Nml Nml N/A  Left Deltoid Nml Nml Nml Nml Nml Nml Nml Nml N/A  Left AntTibialis Nml Nml Nml Nml Nml Nml Nml Nml N/A  Left Gastroc Nml Nml Nml Nml Nml Nml Nml Nml N/A  Left Flex Dig Long Nml Nml Nml Nml Nml Nml Nml Nml N/A  Left RectFemoris Nml Nml Nml Nml Nml Nml Nml Nml N/A  Left GluteusMed Nml Nml Nml Nml Nml Nml Nml Nml N/A      Waveforms:

## 2023-06-29 ENCOUNTER — Other Ambulatory Visit: Payer: Self-pay

## 2023-06-29 DIAGNOSIS — R292 Abnormal reflex: Secondary | ICD-10-CM

## 2023-06-29 DIAGNOSIS — R202 Paresthesia of skin: Secondary | ICD-10-CM

## 2023-07-13 ENCOUNTER — Ambulatory Visit: Payer: Medicaid Other | Admitting: Medical

## 2023-07-14 ENCOUNTER — Ambulatory Visit (INDEPENDENT_AMBULATORY_CARE_PROVIDER_SITE_OTHER): Payer: Medicaid Other | Admitting: Medical

## 2023-07-14 ENCOUNTER — Encounter: Payer: Self-pay | Admitting: Medical

## 2023-07-14 VITALS — BP 140/88 | HR 82 | Temp 98.0°F | Resp 18 | Ht <= 58 in | Wt 175.0 lb

## 2023-07-14 DIAGNOSIS — G629 Polyneuropathy, unspecified: Secondary | ICD-10-CM

## 2023-07-14 DIAGNOSIS — M2550 Pain in unspecified joint: Secondary | ICD-10-CM | POA: Diagnosis not present

## 2023-07-14 DIAGNOSIS — R232 Flushing: Secondary | ICD-10-CM | POA: Diagnosis not present

## 2023-07-14 LAB — SEDIMENTATION RATE: Sed Rate: 56 mm/h — ABNORMAL HIGH (ref 0–20)

## 2023-07-14 LAB — URIC ACID: Uric Acid, Serum: 4.3 mg/dL (ref 2.4–7.0)

## 2023-07-14 LAB — C-REACTIVE PROTEIN: CRP: <1 mg/dL (ref 0.5–20.0)

## 2023-07-14 MED ORDER — METHYLPREDNISOLONE 4 MG PO TABS: ORAL_TABLET | ORAL | Status: DC

## 2023-07-14 NOTE — Progress Notes (Signed)
Subjective:    Patient ID: Phyllis Adams, female    DOB: 1976/06/15, 47 y.o.   MRN: [id number]  HPI Discussed the use of AI scribe software for clinical note transcription with the patient, who gave verbal consent to proceed.  History of Present Illness   The patient initially presented with severe burning pain in the wrist, predominantly on the left side, which has since extended to both sides. The pain radiates up to the elbows and has reached a point where the patient experiences difficulty in gripping or opening anything. The pain is daily but intermittent.  In addition to the wrist pain, the patient also experiences significant discomfort in the feet, particularly from the heels to the ankles. Despite changing shoes and investing in expensive insoles, the pain persists. The patient describes the pain as a severe ache that extends to the front of the feet. This pain is also daily and bilateral.  The patient has undergone various tests and consultations, including inflammatory lab studies which showed a positive ANA and slightly elevated sed rate. A rheumatologist was consulted but did not indicate a diagnosis of lupus. Neurological and sports medicine consultations were also conducted, including nerve conduction studies and EMG, which returned normal results.   The patient has tried wrist splints for potential carpal tunnel syndrome, but found that the pressure exacerbated the pain.  The patient also reports severe pain in the feet, to the point of difficulty walking, especially after being on her feet for more than five hours. . Ibuprofen has been found to provide some relief for both the feet and wrist pain.  Previous tests showed a B12 level on the lower end of the normal range and a low B1 level. The patient's blood pressure was elevated, potentially due to the pain, and she is currently on losartan 25mg  daily and amlodipine 10mg  daily for management.   hot flashes for months. one gyn  did not offer treatment other than otc estroven. Now want to referred to new gyn.        Review of Systems  Constitutional:  Negative for chills and fatigue.  Respiratory:  Negative for cough, chest tightness and wheezing.   Cardiovascular:  Negative for chest pain and palpitations.  Gastrointestinal:  Negative for abdominal pain.  Musculoskeletal:  Positive for arthralgias.  Skin:  Negative for rash.  Neurological:        Neuropathy type pain at times.    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   Retained orthopedic hardware 01/16/2016   Right leg pain 07/16/2016   Segmental and somatic dysfunction of 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   Spasm of muscle 04/15/2016     Social History   Socioeconomic History   Marital status: Single    Spouse name: Not on file   Number of children: Not on file   Years of education: Not on file   Highest education level: Bachelor's degree (e.g., BA, AB, BS)  Occupational History   Not on file  Tobacco Use   Smoking status: Every Day  Types: Cigars, Cigarettes   Smokeless tobacco: Never   Tobacco comments:    3 cigars with marijuana    Smokes cigarettes when driving  Vaping Use   Vaping status: Never Used  Substance and Sexual Activity   Alcohol use: No   Drug use: Yes    Types: Marijuana   Sexual activity: Yes    Birth control/protection: Other-see comments, Surgical  Other Topics Concern   Not on file  Social History Narrative   Are you right handed or left handed? Right Handed   Are you currently  employed ? 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 No    Who lives with you? Kids - adult age    What type of home do you live in: 1 story or 2 story? Lives in a one story home        Social Determinants of Health   Financial Resource Strain: Low Risk  (07/12/2023)   Overall Financial Resource Strain (CARDIA)    Difficulty of Paying Living Expenses: Not very hard  Food Insecurity: Patient Declined (07/12/2023)   Hunger Vital Sign    Worried About Running Out of Food in the Last Year: Patient declined    Ran Out of Food in the Last Year: Patient declined  Transportation Needs: No Transportation Needs (07/12/2023)   PRAPARE - Administrator, Civil Service (Medical): No    Lack of Transportation (Non-Medical): No  Physical Activity: Sufficiently Active (07/12/2023)   Exercise Vital Sign    Days of Exercise per Week: 7 days    Minutes of Exercise per Session: 30 min  Stress: Stress Concern Present (07/12/2023)   [employer] of Occupational Health - Occupational Stress Questionnaire    Feeling of Stress : Rather much  Social Connections: Socially Isolated (07/12/2023)   Social Connection and Isolation Panel [NHANES]    Frequency of Communication with Friends and Family: Once a week    Frequency of Social Gatherings with Friends and Family: Never    Attends Religious Services: Never    Database administrator or Organizations: No    Attends Engineer, structural: Not on file    Marital Status: Married  Catering manager Violence: Not on file    Past Surgical History:  Procedure Laterality Date   APPENDECTOMY     CHOLECYSTECTOMY     FEMUR FRACTURE SURGERY Right 2006   FEMUR IM ROD REMOVAL Right 03/11/2016   TUBAL LIGATION      Family History  Problem Relation Age of Onset   Hypertension Mother    Hypertension Maternal Aunt    Hypertension Paternal Uncle    Hypertension Maternal Grandmother    Hypertension Maternal  Grandfather     Allergies  Allergen Reactions   Tramadol Shortness Of Breath and Nausea And Vomiting   Hydrocodone-Acetaminophen Other (See Comments)    Sweats, heavy breathing   Acetaminophen-Codeine Nausea Only    Current Outpatient Medications on File Prior to Visit  Medication Sig Dispense Refill   acetaminophen (TYLENOL) 500 MG tablet Take 1,000 mg by mouth every 6 (six) hours as needed for mild pain or headache.     albuterol (VENTOLIN HFA) 108 (90 Base) MCG/ACT inhaler INHALE 2 PUFFS INTO THE LUNGS EVERY 6 HOURS AS NEEDED FOR WHEEZING OR SHORTNESS OF BREATH 18 g 0   ALPRAZolam (XANAX) 0.5 MG tablet 1 tab po30 minutes  prior to dental procedure for anxiety 3 tablet  0   amLODipine (NORVASC) 10 MG tablet 1 tab po q day 90 tablet 3   butalbital-aspirin-caffeine (FIORINAL) 50-325-40 MG capsule Take 1 capsule by mouth every 6 (six) hours as needed for headache. 12 capsule 0   fluticasone (FLONASE) 50 MCG/ACT nasal spray Place 2 sprays into both nostrils daily. 16 g 1   ibuprofen (ADVIL) 800 MG tablet Take 1 tablet (800 mg total) by mouth every 8 (eight) hours as needed for moderate pain. 30 tablet 0   losartan (COZAAR) 25 MG tablet TAKE 1 TABLET(25 MG) BY MOUTH DAILY 30 tablet 11   ondansetron (ZOFRAN-ODT) 8 MG disintegrating tablet DISSOLVE 1 TABLET(8 MG) ON THE TONGUE EVERY 8 HOURS AS NEEDED FOR NAUSEA OR VOMITING 15 tablet 0   PARoxetine (PAXIL) 10 MG tablet Take 1 tablet (10 mg total) by mouth daily. (Patient not taking: Reported on 06/15/2023) 30 tablet 0   potassium chloride SA (KLOR-CON M) 20 MEQ tablet Take 1 tablet (20 mEq total) by mouth 2 (two) times daily for 3 days. (Patient not taking: Reported on 06/15/2023) 6 tablet 0   SUMAtriptan (IMITREX) 50 MG tablet TAKE 1 TABLET BY MOUTH AT ONSET OF MIGRAINE. MAY REPEAT IN 2 HOURS IF HEADACHE PERSISTS OR RECURS 10 tablet 2   tiZANidine (ZANAFLEX) 4 MG tablet Take 1 tablet (4 mg total) by mouth every 6 (six) hours as needed for muscle  spasms. 30 tablet 0   No current facility-administered medications on file prior to visit.    BP (!) 140/88   Pulse 82   Temp 98 F (36.7 C)   Resp 18   Ht 4\' 10"  (1.473 m)   Wt 175 lb (79.4 kg)   SpO2 100%   BMI 36.58 kg/m        Objective:   Physical Exam  General Mental Status- Alert. General Appearance- Not in acute distress.   Skin General: Color- Normal Color. Moisture- Normal Moisture.  Neck Carotid Arteries- Normal color. Moisture- Normal Moisture. No carotid bruits. No JVD.  Chest and Lung Exam Auscultation: Breath Sounds:-Normal.  Cardiovascular Auscultation:Rythm- Regular. Murmurs & Other Heart Sounds:Auscultation of the heart reveals- No Murmurs.  Abdomen Inspection:-Inspeection Normal. Palpation/Percussion:Note:No mass. Palpation and Percussion of the abdomen reveal- Non Tender, Non Distended + BS, no rebound or guarding.   Neurologic Cranial Nerve exam:- CN III-XII intact(No nystagmus), symmetric smile. Strength:- 5/5 equal and symmetric strength both upper and lower extremities.       Assessment & Plan:   Assessment and Plan    Joint Pain Daily pain in wrists, hands, ankles, and feet. Previous ANA positive and slightly elevated sed rate. Rheumatologist did not diagnose lupus. Neurologist and sports medicine found no evidence of active or chronic motor axonal loss. Pain is responsive to ibuprofen. -Repeat inflammatory labs . -Start Medrol 6-day taper to assess response. - for neuropathy like pain at times. Repeat B12 and B1 levels due to previous low-normal and low results respectively.  Menopausal Symptoms Daily hot flashes not responsive to Estrovan. -Referred to gynecologist for further evaluation and potential treatment with Veozah.  Hypertension Blood pressure 140/80, potentially elevated due to pain. -Continue Losartan 25mg  daily and Amlodipine 10mg  daily. -Recheck blood pressure in 2 weeks.  Follow-up in 2 weeks to assess  response to Medrol taper and recheck blood pressure.

## 2023-07-14 NOTE — Patient Instructions (Signed)
Joint Pain Daily pain in wrists, hands, ankles, and feet. Previous ANA positive and slightly elevated sed rate. Rheumatologist did not diagnose lupus. Neurologist and sports medicine found no evidence of active or chronic motor axonal loss. Pain is responsive to ibuprofen. -Repeat inflammatory labs . -Start Medrol 6-day taper to assess response. - for neuropathy like pain at times. Repeat B12 and B1 levels due to previous low-normal and low results respectively.  Menopausal Symptoms Daily hot flashes not responsive to Estrovan. -Referred to gynecologist for further evaluation and potential treatment with Veozah.  Hypertension Blood pressure 140/80, potentially elevated due to pain. -Continue Losartan 25mg  daily and Amlodipine 10mg  daily. -Recheck blood pressure in 2 weeks.  Follow-up in 2 weeks to assess response to Medrol taper and recheck blood pressure.

## 2023-07-15 ENCOUNTER — Telehealth: Payer: Self-pay | Admitting: Medical

## 2023-07-15 NOTE — Telephone Encounter (Signed)
Pt called stating that her pharmacy had not received the medication that [name] had prescribed for her. After reviewing chart, noted that the order class was marked as print rather than it being e-scribed. Advised pt a note would be sent back to correct this. Routed HP due to internal error.

## 2023-07-16 ENCOUNTER — Encounter: Payer: Self-pay | Admitting: Medical

## 2023-07-16 MED ORDER — METHYLPREDNISOLONE 4 MG PO TABS: ORAL_TABLET | ORAL | Status: DC

## 2023-07-16 NOTE — Telephone Encounter (Signed)
Med resent to pharmacy

## 2023-07-16 NOTE — Telephone Encounter (Signed)
Refill faxed to [facility]

## 2023-07-16 NOTE — Addendum Note (Signed)
Addended by: Maximino Sarin on: 07/16/2023 08:22 AM   Modules accepted: Orders

## 2023-07-16 NOTE — Telephone Encounter (Signed)
Pt viewed labs via [employer]

## 2023-07-18 ENCOUNTER — Emergency Department (HOSPITAL_BASED_OUTPATIENT_CLINIC_OR_DEPARTMENT_OTHER): Payer: Medicaid Other

## 2023-07-18 ENCOUNTER — Emergency Department (HOSPITAL_BASED_OUTPATIENT_CLINIC_OR_DEPARTMENT_OTHER)
Admission: EM | Admit: 2023-07-18 | Discharge: 2023-07-18 | Disposition: A | Discharge status: Home / Self Care | Payer: Medicaid Other | Attending: Emergency Medicine | Admitting: Emergency Medicine

## 2023-07-18 ENCOUNTER — Encounter: Payer: Self-pay | Admitting: Medical

## 2023-07-18 ENCOUNTER — Other Ambulatory Visit: Payer: Self-pay

## 2023-07-18 ENCOUNTER — Encounter (HOSPITAL_BASED_OUTPATIENT_CLINIC_OR_DEPARTMENT_OTHER): Payer: Self-pay | Admitting: Emergency Medicine

## 2023-07-18 DIAGNOSIS — Z79899 Other long term (current) drug therapy: Secondary | ICD-10-CM | POA: Insufficient documentation

## 2023-07-18 DIAGNOSIS — I10 Essential (primary) hypertension: Secondary | ICD-10-CM | POA: Diagnosis not present

## 2023-07-18 DIAGNOSIS — Z7982 Long term (current) use of aspirin: Secondary | ICD-10-CM | POA: Insufficient documentation

## 2023-07-18 DIAGNOSIS — R519 Headache, unspecified: Secondary | ICD-10-CM | POA: Insufficient documentation

## 2023-07-18 DIAGNOSIS — M2550 Pain in unspecified joint: Secondary | ICD-10-CM | POA: Insufficient documentation

## 2023-07-18 LAB — ANTI-NUCLEAR AB-TITER (ANA TITER)
ANA TITER: 1:40 {titer} — ABNORMAL HIGH
ANA Titer 1: 1:40 {titer} — ABNORMAL HIGH

## 2023-07-18 LAB — ANA: Anti Nuclear Antibody (ANA): POSITIVE — AB

## 2023-07-18 LAB — RHEUMATOID FACTOR: Rheumatoid fact SerPl-aCnc: <10 [IU]/mL (ref <14)

## 2023-07-18 NOTE — Discharge Instructions (Signed)
Continue the steroid treatment that your primary care doctor is recommended.  Would recommend also using your migraine medicine that you have at home.  Head CT here today without any acute findings.  Also would recommend follow up with your primary care doctor in the next few days.

## 2023-07-18 NOTE — ED Provider Notes (Addendum)
[place] [facility] Provider Note   CSN: [id number] Arrival date & time: 07/18/23  6644     History  Chief Complaint  Patient presents with   Joint Pain    Phyllis Adams is a 47 y.o. female.  Sent here with a complaint of kind of joint pain all over.  Predominantly in her lower extremities.  Patient's primary care doctor has been trying to work this up since February.  But the symptoms are now more extensive and more prominent.  Patient was seen by her primary care doctor November 6.  Had labs done that day.  She is also seeing rheumatology previously for this.  They have not been able to definitively make a diagnosis on her symptoms.  Review of labs from the 6 ANA still pending.  But rheumatoid factor C-reactive protein were all normal.  Patient's sedimentation rate was in the 50s.  But was less than 100.  Patient also has a history of migraines.  She is complaining of a headache with some blurred vision says little atypical for her migraine.  Usually there is nausea and vomiting with that.  Her primary care doctor started her on steroids.  She just started them yesterday.  Patient is allergic to tramadol and any kind of codeine or synthetic codeine pain medicines.  Patient does have migraine medicine at home which is Fiorinal and Imitrex.  She has not tried any of that for this headache.  Denies any injury.  Denies any weakness difficulty with speech or numbness.  Does have a little bit of blurred vision.  Patient is an everyday smoker.  Past medical history sniffer hypertension mood disorder migraines.  Past surgical history sniffing for appendectomy cholecystectomy.       Home Medications Prior to Admission medications   Medication Sig Start Date End Date Taking? Authorizing Provider  acetaminophen (TYLENOL) 500 MG tablet Take 1,000 mg by mouth every 6 (six) hours as needed for mild pain or headache.    [provider]  albuterol  (VENTOLIN HFA) 108 (90 Base) MCG/ACT inhaler INHALE 2 PUFFS INTO THE LUNGS EVERY 6 HOURS AS NEEDED FOR WHEEZING OR SHORTNESS OF BREATH 03/14/21   [name], [name], PA-C  ALPRAZolam Prudy Feeler) 0.5 MG tablet 1 tab po30 minutes  prior to dental procedure for anxiety 12/02/22   [name], [name], PA-C  amLODipine (NORVASC) 10 MG tablet 1 tab po q day 12/02/22   [name], [name], PA-C  butalbital-aspirin-caffeine [facility]) [phone] MG capsule Take 1 capsule by mouth every 6 (six) hours as needed for headache. 01/30/22   [name], [name], PA-C  fluticasone (FLONASE) 50 MCG/ACT nasal spray Place 2 sprays into both nostrils daily. 05/21/22   [name], [name], PA-C  ibuprofen (ADVIL) 800 MG tablet Take 1 tablet (800 mg total) by mouth every 8 (eight) hours as needed for moderate pain. 05/19/23   [name], [name], PA-C  losartan (COZAAR) 25 MG tablet TAKE 1 TABLET(25 MG) BY MOUTH DAILY 06/17/23   [name], [name], PA-C  methylPREDNISolone (MEDROL) 4 MG tablet Standard 6 day taper dose 07/16/23   [name], [name], PA-C  ondansetron (ZOFRAN-ODT) 8 MG disintegrating tablet DISSOLVE 1 TABLET(8 MG) ON THE TONGUE EVERY 8 HOURS AS NEEDED FOR NAUSEA OR VOMITING 05/19/23   [name], [name], PA-C  PARoxetine (PAXIL) 10 MG tablet Take 1 tablet (10 mg total) by mouth daily. Patient not taking: Reported on 06/15/2023 05/19/23   [name], [name], PA-C  potassium chloride SA (KLOR-CON M) 20 MEQ tablet Take 1 tablet (  20 mEq total) by mouth 2 (two) times daily for 3 days. Patient not taking: Reported on 06/15/2023 04/01/23 06/15/23  Hyman Hopes B, NP  SUMAtriptan (IMITREX) 50 MG tablet TAKE 1 TABLET BY MOUTH AT ONSET OF MIGRAINE. MAY REPEAT IN 2 HOURS IF HEADACHE PERSISTS OR RECURS 05/03/23   [name], [name], PA-C  tiZANidine (ZANAFLEX) 4 MG tablet Take 1 tablet (4 mg total) by mouth every 6 (six) hours as needed for muscle spasms. 10/20/21   [name], [name], PA-C      Allergies    Tramadol, Hydrocodone-acetaminophen, and Acetaminophen-codeine     Review of Systems   Review of Systems  Constitutional:  Negative for chills and fever.  HENT:  Negative for ear pain and sore throat.   Eyes:  Negative for pain and visual disturbance.  Respiratory:  Negative for cough and shortness of breath.   Cardiovascular:  Negative for chest pain and palpitations.  Gastrointestinal:  Negative for abdominal pain and vomiting.  Genitourinary:  Negative for dysuria and hematuria.  Musculoskeletal:  Positive for arthralgias. Negative for back pain.  Skin:  Negative for color change and rash.  Neurological:  Positive for headaches. Negative for seizures and syncope.  All other systems reviewed and are negative.   Physical Exam Updated Vital Signs BP (!) 145/88   Pulse 75   Temp 98.2 F (36.8 C) (Oral)   Resp 20   Ht 1.473 m (4\' 10" )   Wt 79.4 kg   SpO2 97%   BMI 36.58 kg/m  Physical Exam Vitals and nursing note reviewed.  Constitutional:      General: She is not in acute distress.    Appearance: Normal appearance. She is well-developed. She is not ill-appearing or toxic-appearing.  HENT:     Head: Normocephalic and atraumatic.     Mouth/Throat:     Mouth: Mucous membranes are moist.  Eyes:     Extraocular Movements: Extraocular movements intact.     Conjunctiva/sclera: Conjunctivae normal.     Pupils: Pupils are equal, round, and reactive to light.  Cardiovascular:     Rate and Rhythm: Normal rate and regular rhythm.     Heart sounds: No murmur heard. Pulmonary:     Effort: Pulmonary effort is normal. No respiratory distress.     Breath sounds: Normal breath sounds.  Abdominal:     Palpations: Abdomen is soft.     Tenderness: There is no abdominal tenderness.  Musculoskeletal:        General: No swelling.     Cervical back: Normal range of motion and neck supple. No rigidity.  Skin:    General: Skin is warm and dry.     Capillary Refill: Capillary refill takes less than 2 seconds.  Neurological:     General: No focal  deficit present.     Mental Status: She is alert and oriented to person, place, and time.  Psychiatric:        Mood and Affect: Mood normal.     ED Results / Procedures / Treatments   Labs (all labs ordered are listed, but only abnormal results are displayed) Labs Reviewed - No data to display  EKG None  Radiology No results found.  Procedures Procedures    Medications Ordered in ED Medications - No data to display  ED Course/ Medical Decision Making/ A&P  Medical Decision Making Amount and/or Complexity of Data Reviewed Radiology: ordered.   Patient's had workup in progress to rule out connective tissue disorder.  Symptoms are getting worse.  Possible symptoms could be related to her fibromyalgia diagnosis but not formally diagnosed with that yet.  Because of the headache being a little different than her typical migraines we will going get CT head.  Patient feels comfortable doing that.  Patient without any significant neurodeficits.  No concerns for any meningitis.  Patient is allergic to most pain medicines does not want to try IM Dilaudid here.  Already getting steroids.  Does not appear to be any severe distress secondary to headache.  And she does have headache medications at home to take.  Patient will need work note.  Patient's primary care doctor is in the process of trying to evaluate the joint pain.  Patient has seen rheumatology 1 time in the past may need to follow-up with them again.  But will defer to her primary care doctor.  Patient nontoxic no acute distress.  Head CT without any acute findings.  Which is reassuring.  Will give patient a work note to have her take her migraine medication at home and continue her steroid course.  And follow back up with her primary care doctor.  Final Clinical Impression(s) / ED Diagnoses Final diagnoses:  Acute intractable headache, unspecified headache type  Arthralgia, unspecified  joint    Rx / DC Orders ED Discharge Orders     None         Vanetta Mulders, MD 07/18/23 1610    Vanetta Mulders, MD 07/18/23 [phone]

## 2023-07-18 NOTE — ED Triage Notes (Signed)
Pt reports ongoing issues with joint pain that are being worked up by her PCP. She states her joint pain is getting worse, and she has had a HA since last night around 2200. Recently started on a prednisone taper by her PCP.

## 2023-07-19 ENCOUNTER — Encounter: Payer: Self-pay | Admitting: Medical

## 2023-07-19 ENCOUNTER — Ambulatory Visit (INDEPENDENT_AMBULATORY_CARE_PROVIDER_SITE_OTHER): Payer: Medicaid Other | Admitting: Medical

## 2023-07-19 VITALS — BP 130/80 | HR 77 | Temp 98.0°F | Resp 18 | Ht <= 58 in | Wt 175.0 lb

## 2023-07-19 DIAGNOSIS — M2550 Pain in unspecified joint: Secondary | ICD-10-CM | POA: Diagnosis not present

## 2023-07-19 DIAGNOSIS — R232 Flushing: Secondary | ICD-10-CM

## 2023-07-19 MED ORDER — KETOROLAC TROMETHAMINE 60 MG/2ML IM SOLN
60.0000 mg | Freq: Once | INTRAMUSCULAR | Status: AC
  Administered 2023-07-19: 60 mg via INTRAMUSCULAR

## 2023-07-19 NOTE — Patient Instructions (Addendum)
1. Arthralgia, unspecified joint Recent worse arthralgias and a sed rate increased compared to previous readings and ANA still positive.  Unfortunately reported side effects of upset stomach and headache with Medrol.  Will see if Toradol injection could help resolve current joint pains.  Will avoid any NSAID use until Wednesday morning.  Update me by Thursday morning what your pains persist.  In that event would consider low-dose steroid injection as on review you have had numerous epidural injections and he reports minimal less side effects with those injections.  Lab and placed referral to rheumatologist as you have already seen that office 1 time.  You could call our office directly try to expedite the referral. - Ambulatory referral to Rheumatology - ketorolac (TORADOL) injection 60 mg  2. Hot flashes  -Please go ahead and try to call the gynecologist.  You have to phone number.  Hopefully they will get you in sooner appointment.  Follow-up date to be determined after you give me an update later this week

## 2023-07-19 NOTE — Progress Notes (Signed)
Subjective:    Patient ID: Phyllis Adams, female    DOB: October 21, 1975, 47 y.o.   MRN: [id number]  HPI Pt in for follow up from the ED.  Pt went to the ED 07-18-23  "Sent here with a complaint of kind of joint pain all over.  Predominantly in her lower extremities.  Patient's primary care doctor has been trying to work this up since February.  But the symptoms are now more extensive and more prominent.  Patient was seen by her primary care doctor November 6.  Had labs done that day.  She is also seeing rheumatology previously for this.  They have not been able to definitively make a diagnosis on her symptoms.  Review of labs from the 6 ANA still pending.  But rheumatoid factor C-reactive protein were all normal.  Patient's sedimentation rate was in the 50s.  But was less than 100.  Patient also has a history of migraines.  She is complaining of a headache with some blurred vision says little atypical for her migraine.  Usually there is nausea and vomiting with that.  Her primary care doctor started her on steroids.  She just started them yesterday.  Patient is allergic to tramadol and any kind of codeine or synthetic codeine pain medicines.  Patient does have migraine medicine at home which is Fiorinal and Imitrex.  She has not tried any of that for this headache.  Denies any injury.  Denies any weakness difficulty with speech or numbness.  Does have a little bit of blurred vision.  Patient is an everyday smoker.   Past medical history sniffer hypertension mood disorder migraines.  Past surgical history sniffing for appendectomy cholecystectomy."  A/P  "Patient's had workup in progress to rule out connective tissue disorder.  Symptoms are getting worse.  Possible symptoms could be related to her fibromyalgia diagnosis but not formally diagnosed with that yet.   Because of the headache being a little different than her typical migraines we will going get CT head.  Patient feels comfortable doing that.   Patient without any significant neurodeficits.  No concerns for any meningitis.  Patient is allergic to most pain medicines does not want to try IM Dilaudid here.  Already getting steroids.  Does not appear to be any severe distress secondary to headache.  And she does have headache medications at home to take.   Patient will need work note.   Patient's primary care doctor is in the process of trying to evaluate the joint pain.  Patient has seen rheumatology 1 time in the past may need to follow-up with them again.  But will defer to her primary care doctor.   Patient nontoxic no acute distress.   Head CT without any acute findings.  Which is reassuring.  Will give patient a work note to have her take her migraine medication at home and continue her steroid course.  And follow back up with her primary care doctor.   Final Clinical Impression(s) / ED Diagnoses Final diagnoses:  Acute intractable headache, unspecified headache type  Arthralgia, unspecified joint  "  Pt still having wrist pain. Some pain bottom of feet and some shoulder.  Sed rate was 56  5 days ago. 7 month ago was 26.   Rh factor negative. Ana and titer again +/elevated.  Pt states she was unable to take medrol more than   Review of Systems  Constitutional:  Negative for chills and fatigue.  Respiratory:  Negative for cough, chest  tightness, shortness of breath and wheezing.   Cardiovascular:  Negative for chest pain and palpitations.  Gastrointestinal:  Negative for abdominal pain.  Musculoskeletal:  Positive for arthralgias and myalgias. Negative for back pain.       Some trapezius pain left side.  Skin:  Negative for rash.  Hematological:  Negative for adenopathy. Does not bruise/bleed easily.  Psychiatric/Behavioral:  Negative for behavioral problems and dysphoric mood.     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   Retained orthopedic hardware 01/16/2016   Right leg pain 07/16/2016   Segmental and somatic dysfunction of 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   Spasm of muscle 04/15/2016     Social History   Socioeconomic History   Marital status: Married    Spouse name: Not on file   Number of children: Not on file   Years of education: Not on file   Highest education level: Bachelor's degree (e.g., BA, AB, BS)  Occupational History   Not on file  Tobacco Use   Smoking status: Every Day    Types: Cigars, Cigarettes   Smokeless tobacco: Never   Tobacco comments:    3 cigars with marijuana    Smokes cigarettes when driving  Vaping Use   Vaping status: Never Used  Substance and Sexual Activity   Alcohol use: No   Drug use: Yes    Types: Marijuana   Sexual activity: Yes    Birth control/protection: Other-see comments, Surgical  Other Topics Concern   Not on file  Social History Narrative   Are you right handed or left handed? Right Handed   Are you currently employed ? 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 No    Who lives with you? Kids - adult age    What type of home do you live in: 1 story or 2 story? Lives in a one story home        Social Determinants of Health   Financial Resource Strain: Low Risk  (07/12/2023)   Overall Financial Resource Strain (CARDIA)    Difficulty of Paying Living Expenses: Not very hard  Food Insecurity: Patient Declined (07/12/2023)   Hunger Vital Sign    Worried About Running Out of Food in the Last Year: Patient declined    Ran Out of Food in the Last Year: Patient  declined  Transportation Needs: No Transportation Needs (07/12/2023)   PRAPARE - Administrator, Civil Service (Medical): No    Lack of Transportation (Non-Medical): No  Physical Activity: Sufficiently Active (07/12/2023)   Exercise Vital Sign    Days of Exercise per Week: 7 days    Minutes of Exercise per Session: 30 min  Stress: Stress Concern Present (07/12/2023)   [employer] of Occupational Health - Occupational Stress Questionnaire    Feeling of Stress : Rather much  Social Connections: Socially Isolated (07/12/2023)   Social Connection and Isolation Panel [NHANES]    Frequency of Communication with Friends and Family: Once a week  Frequency of Social Gatherings with Friends and Family: Never    Attends Religious Services: Never    Database administrator or Organizations: No    Attends Engineer, structural: Not on file    Marital Status: Married  Catering manager Violence: Not on file    Past Surgical History:  Procedure Laterality Date   APPENDECTOMY     CHOLECYSTECTOMY     FEMUR FRACTURE SURGERY Right 2006   FEMUR IM ROD REMOVAL Right 03/11/2016   TUBAL LIGATION      Family History  Problem Relation Age of Onset   Hypertension Mother    Hypertension Maternal Aunt    Hypertension Paternal Uncle    Hypertension Maternal Grandmother    Hypertension Maternal Grandfather     Allergies  Allergen Reactions   Tramadol Shortness Of Breath and Nausea And Vomiting   Hydrocodone-Acetaminophen Other (See Comments)    Sweats, heavy breathing   Prednisone Other (See Comments)    Headache , nausea , abd pain   Acetaminophen-Codeine Nausea Only    Current Outpatient Medications on File Prior to Visit  Medication Sig Dispense Refill   acetaminophen (TYLENOL) 500 MG tablet Take 1,000 mg by mouth every 6 (six) hours as needed for mild pain or headache.     albuterol (VENTOLIN HFA) 108 (90 Base) MCG/ACT inhaler INHALE 2 PUFFS INTO THE LUNGS EVERY 6  HOURS AS NEEDED FOR WHEEZING OR SHORTNESS OF BREATH 18 g 0   ALPRAZolam (XANAX) 0.5 MG tablet 1 tab po30 minutes  prior to dental procedure for anxiety 3 tablet 0   amLODipine (NORVASC) 10 MG tablet 1 tab po q day 90 tablet 3   butalbital-aspirin-caffeine (FIORINAL) 50-325-40 MG capsule Take 1 capsule by mouth every 6 (six) hours as needed for headache. 12 capsule 0   fluticasone (FLONASE) 50 MCG/ACT nasal spray Place 2 sprays into both nostrils daily. 16 g 1   losartan (COZAAR) 25 MG tablet TAKE 1 TABLET(25 MG) BY MOUTH DAILY 30 tablet 11   methylPREDNISolone (MEDROL) 4 MG tablet Standard 6 day taper dose 21 tablet O   ondansetron (ZOFRAN-ODT) 8 MG disintegrating tablet DISSOLVE 1 TABLET(8 MG) ON THE TONGUE EVERY 8 HOURS AS NEEDED FOR NAUSEA OR VOMITING 15 tablet 0   PARoxetine (PAXIL) 10 MG tablet Take 1 tablet (10 mg total) by mouth daily. (Patient not taking: Reported on 06/15/2023) 30 tablet 0   potassium chloride SA (KLOR-CON M) 20 MEQ tablet Take 1 tablet (20 mEq total) by mouth 2 (two) times daily for 3 days. (Patient not taking: Reported on 06/15/2023) 6 tablet 0   SUMAtriptan (IMITREX) 50 MG tablet TAKE 1 TABLET BY MOUTH AT ONSET OF MIGRAINE. MAY REPEAT IN 2 HOURS IF HEADACHE PERSISTS OR RECURS 10 tablet 2   tiZANidine (ZANAFLEX) 4 MG tablet Take 1 tablet (4 mg total) by mouth every 6 (six) hours as needed for muscle spasms. 30 tablet 0   No current facility-administered medications on file prior to visit.    BP 130/80   Pulse 77   Temp 98 F (36.7 C)   Resp 18   Ht 4\' 10"  (1.473 m)   Wt 175 lb (79.4 kg)   SpO2 100%   BMI 36.58 kg/m        Objective:   Physical Exam  General Mental Status- Alert. General Appearance- Not in acute distress.   Skin General: Color- Normal Color. Moisture- Normal Moisture.  Neck -left side trapezius tender to palpation.  Chest  and Lung Exam Auscultation: Breath Sounds:-Normal.  Cardiovascular Auscultation:Rythm- Regular. Murmurs &  Other Heart Sounds:Auscultation of the heart reveals- No Murmurs.  Abdomen Inspection:-Inspeection Normal. Palpation/Percussion:Note:No mass. Palpation and Percussion of the abdomen reveal- Non Tender, Non Distended + BS, no rebound or guarding.    Neurologic Cranial Nerve exam:- CN III-XII intact(No nystagmus), symmetric smile. Strength:- 5/5 equal and symmetric strength both upper and lower extremities.   Bilateral wrsit tenderness to palpation. Left shoulder- tender palpation.    Assessment & Plan:   Patient Instructions  1. Arthralgia, unspecified joint Recent worse arthralgias and a sed rate increased compared to previous readings and ANA still positive.  Unfortunately reported side effects of upset stomach and headache with Medrol.  Will see if Toradol injection could help resolve current joint pains.  Will avoid any NSAID use until Wednesday morning.  Update me by Thursday morning what your pains persist.  In that event would consider low-dose steroid injection as on review you have had numerous epidural injections and he reports minimal less side effects with those injections.  Lab and placed referral to rheumatologist as you have already seen that office 1 time.  You could call our office directly try to expedite the referral. - Ambulatory referral to Rheumatology - ketorolac (TORADOL) injection 60 mg  2. Hot flashes  -Please go ahead and try to call the gynecologist.  You have to phone number.  Hopefully they will get you in sooner appointment.  Follow-up date to be determined after you give me an update later this week   Esperanza Richters, PA-C

## 2023-07-21 ENCOUNTER — Other Ambulatory Visit: Payer: Medicaid Other

## 2023-07-21 ENCOUNTER — Telehealth: Payer: Self-pay | Admitting: Medical

## 2023-07-21 ENCOUNTER — Encounter: Payer: Self-pay | Admitting: Medical

## 2023-07-21 NOTE — Telephone Encounter (Signed)
Pt called and requested to speak with PCP regarding her visit with the rheumatologist doctor she was referred too. She explained that Alphonse Guild, was disrespectful, rude, and unreceptive to her health needs. Pt would like to discuss her other options. Please call and advise.

## 2023-07-22 ENCOUNTER — Encounter: Payer: Medicaid Other | Admitting: Neurology

## 2023-07-22 NOTE — Telephone Encounter (Signed)
Pt would like to see [facility]

## 2023-07-22 NOTE — Telephone Encounter (Signed)
PT called to follow up on message because she has to go back to work tomorrow and the shot wore off. Pt was advised that message was sent to her provider earlier this morning. Please call pt to advise.

## 2023-07-23 ENCOUNTER — Ambulatory Visit (INDEPENDENT_AMBULATORY_CARE_PROVIDER_SITE_OTHER): Payer: Medicaid Other

## 2023-07-23 ENCOUNTER — Encounter (INDEPENDENT_AMBULATORY_CARE_PROVIDER_SITE_OTHER): Payer: Self-pay

## 2023-07-23 ENCOUNTER — Telehealth: Payer: Self-pay

## 2023-07-23 DIAGNOSIS — M2550 Pain in unspecified joint: Secondary | ICD-10-CM

## 2023-07-23 MED ORDER — METHYLPREDNISOLONE ACETATE 40 MG/ML IJ SUSP
40.0000 mg | Freq: Once | INTRAMUSCULAR | Status: AC
  Administered 2023-07-23: 40 mg via INTRAMUSCULAR

## 2023-07-23 NOTE — Telephone Encounter (Signed)
Pt was in the office today and stated that she was seen by [facility]. She did not like the provider that she was seen by, she felt disregarded. She would like a second opinion by another Rheumatologist- I suggested another [facility] provider or [facility].  Pt also asked if she needed another steroid injection at her 11/20 (2 week f/u) OV?

## 2023-07-23 NOTE — Telephone Encounter (Signed)
Thanks

## 2023-07-23 NOTE — Progress Notes (Signed)
Phyllis Adams is a 47 y.o. female presents to the office today for Depo Medrol 40 mg injection, per physician's orders. Original order: 07/22/23 "Can you get her scheduled for depomedrol 40 mg IM injection tomorrow/nurse visit." Depo Medrol 40 mg IM was administered RUOQ Deltoid today. Patient tolerated injection. Patient due for follow up labs/provider appt: No.  Patient next injection due: n/a   Creft, Melton Alar L

## 2023-07-23 NOTE — Telephone Encounter (Signed)
Pt was referred to [facility] Rheum today . And does have a follow up appt on 07/28/23 with PCP
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
## 2023-07-27 ENCOUNTER — Ambulatory Visit: Payer: Medicaid Other | Attending: Rheumatology | Admitting: Rheumatology

## 2023-07-27 ENCOUNTER — Ambulatory Visit (INDEPENDENT_AMBULATORY_CARE_PROVIDER_SITE_OTHER): Payer: Medicaid Other

## 2023-07-27 ENCOUNTER — Ambulatory Visit: Payer: Medicaid Other

## 2023-07-27 ENCOUNTER — Encounter: Payer: Self-pay | Admitting: Rheumatology

## 2023-07-27 VITALS — BP 139/85 | HR 88 | Resp 14 | Ht 60.5 in | Wt 175.0 lb

## 2023-07-27 DIAGNOSIS — M79672 Pain in left foot: Secondary | ICD-10-CM | POA: Insufficient documentation

## 2023-07-27 DIAGNOSIS — Z8781 Personal history of (healed) traumatic fracture: Secondary | ICD-10-CM | POA: Insufficient documentation

## 2023-07-27 DIAGNOSIS — R5383 Other fatigue: Secondary | ICD-10-CM | POA: Insufficient documentation

## 2023-07-27 DIAGNOSIS — M79642 Pain in left hand: Secondary | ICD-10-CM | POA: Insufficient documentation

## 2023-07-27 DIAGNOSIS — M797 Fibromyalgia: Secondary | ICD-10-CM | POA: Insufficient documentation

## 2023-07-27 DIAGNOSIS — G8929 Other chronic pain: Secondary | ICD-10-CM | POA: Diagnosis present

## 2023-07-27 DIAGNOSIS — M79671 Pain in right foot: Secondary | ICD-10-CM

## 2023-07-27 DIAGNOSIS — F419 Anxiety disorder, unspecified: Secondary | ICD-10-CM | POA: Insufficient documentation

## 2023-07-27 DIAGNOSIS — I10 Essential (primary) hypertension: Secondary | ICD-10-CM | POA: Insufficient documentation

## 2023-07-27 DIAGNOSIS — R768 Other specified abnormal immunological findings in serum: Secondary | ICD-10-CM | POA: Diagnosis present

## 2023-07-27 DIAGNOSIS — M47816 Spondylosis without myelopathy or radiculopathy, lumbar region: Secondary | ICD-10-CM | POA: Insufficient documentation

## 2023-07-27 DIAGNOSIS — Z8669 Personal history of other diseases of the nervous system and sense organs: Secondary | ICD-10-CM | POA: Insufficient documentation

## 2023-07-27 DIAGNOSIS — R202 Paresthesia of skin: Secondary | ICD-10-CM | POA: Insufficient documentation

## 2023-07-27 DIAGNOSIS — M2550 Pain in unspecified joint: Secondary | ICD-10-CM | POA: Diagnosis not present

## 2023-07-27 DIAGNOSIS — M7501 Adhesive capsulitis of right shoulder: Secondary | ICD-10-CM | POA: Insufficient documentation

## 2023-07-27 DIAGNOSIS — M25561 Pain in right knee: Secondary | ICD-10-CM | POA: Insufficient documentation

## 2023-07-27 DIAGNOSIS — F1290 Cannabis use, unspecified, uncomplicated: Secondary | ICD-10-CM | POA: Insufficient documentation

## 2023-07-27 DIAGNOSIS — F32A Depression, unspecified: Secondary | ICD-10-CM | POA: Diagnosis present

## 2023-07-27 DIAGNOSIS — E66811 Obesity, class 1: Secondary | ICD-10-CM | POA: Insufficient documentation

## 2023-07-27 DIAGNOSIS — M79641 Pain in right hand: Secondary | ICD-10-CM | POA: Insufficient documentation

## 2023-07-27 DIAGNOSIS — F17210 Nicotine dependence, cigarettes, uncomplicated: Secondary | ICD-10-CM | POA: Insufficient documentation

## 2023-07-27 DIAGNOSIS — F5101 Primary insomnia: Secondary | ICD-10-CM | POA: Insufficient documentation

## 2023-07-27 DIAGNOSIS — I209 Angina pectoris, unspecified: Secondary | ICD-10-CM | POA: Insufficient documentation

## 2023-07-27 DIAGNOSIS — R011 Cardiac murmur, unspecified: Secondary | ICD-10-CM | POA: Insufficient documentation

## 2023-07-27 DIAGNOSIS — R0989 Other specified symptoms and signs involving the circulatory and respiratory systems: Secondary | ICD-10-CM | POA: Insufficient documentation

## 2023-07-27 NOTE — Patient Instructions (Addendum)
Hand Exercises Hand exercises can be helpful for almost anyone. They can strengthen your hands and improve flexibility and movement. The exercises can also increase blood flow to the hands. These results can make your work and daily tasks easier for you. Hand exercises can be especially helpful for people who have joint pain from arthritis or nerve damage from using their hands over and over. These exercises can also help people who injure a hand. Exercises Most of these hand exercises are gentle stretching and motion exercises. It is usually safe to do them often throughout the day. Warming up your hands before exercise may help reduce stiffness. You can do this with gentle massage or by placing your hands in warm water for 10-15 minutes. It is normal to feel some stretching, pulling, tightness, or mild discomfort when you begin new exercises. In time, this will improve. Remember to always be careful and stop right away if you feel sudden, very bad pain or your pain gets worse. You want to get better and be safe. Ask your health care provider which exercises are safe for you. Do exercises exactly as told by your provider and adjust them as told. Do not begin these exercises until told by your provider. Knuckle bend or "claw" fist  Stand or sit with your arm, hand, and all five fingers pointed straight up. Make sure to keep your wrist straight. Gently bend your fingers down toward your palm until the tips of your fingers are touching your palm. Keep your big knuckle straight and only bend the small knuckles in your fingers. Hold this position for 10 seconds. Straighten your fingers back to your starting position. Repeat this exercise 5-10 times with each hand. Full finger fist  Stand or sit with your arm, hand, and all five fingers pointed straight up. Make sure to keep your wrist straight. Gently bend your fingers into your palm until the tips of your fingers are touching the middle of your  palm. Hold this position for 10 seconds. Extend your fingers back to your starting position, stretching every joint fully. Repeat this exercise 5-10 times with each hand. Straight fist  Stand or sit with your arm, hand, and all five fingers pointed straight up. Make sure to keep your wrist straight. Gently bend your fingers at the big knuckle, where your fingers meet your hand, and at the middle knuckle. Keep the knuckle at the tips of your fingers straight and try to touch the bottom of your palm. Hold this position for 10 seconds. Extend your fingers back to your starting position, stretching every joint fully. Repeat this exercise 5-10 times with each hand. Tabletop  Stand or sit with your arm, hand, and all five fingers pointed straight up. Make sure to keep your wrist straight. Gently bend your fingers at the big knuckle, where your fingers meet your hand, as far down as you can. Keep the small knuckles in your fingers straight. Think of forming a tabletop with your fingers. Hold this position for 10 seconds. Extend your fingers back to your starting position, stretching every joint fully. Repeat this exercise 5-10 times with each hand. Finger spread  Place your hand flat on a table with your palm facing down. Make sure your wrist stays straight. Spread your fingers and thumb apart from each other as far as you can until you feel a gentle stretch. Hold this position for 10 seconds. Bring your fingers and thumb tight together again. Hold this position for 10 seconds. Repeat  this exercise 5-10 times with each hand. Making circles  Stand or sit with your arm, hand, and all five fingers pointed straight up. Make sure to keep your wrist straight. Make a circle by touching the tip of your thumb to the tip of your index finger. Hold for 10 seconds. Then open your hand wide. Repeat this motion with your thumb and each of your fingers. Repeat this exercise 5-10 times with each hand. Thumb  motion  Sit with your forearm resting on a table and your wrist straight. Your thumb should be facing up toward the ceiling. Keep your fingers relaxed as you move your thumb. Lift your thumb up as high as you can toward the ceiling. Hold for 10 seconds. Bend your thumb across your palm as far as you can, reaching the tip of your thumb for the small finger (pinkie) side of your palm. Hold for 10 seconds. Repeat this exercise 5-10 times with each hand. Grip strengthening  Hold a stress ball or other soft ball in the middle of your hand. Slowly increase the pressure, squeezing the ball as much as you can without causing pain. Think of bringing the tips of your fingers into the middle of your palm. All of your finger joints should bend when doing this exercise. Hold your squeeze for 10 seconds, then relax. Repeat this exercise 5-10 times with each hand. Contact a health care provider if: Your hand pain or discomfort gets much worse when you do an exercise. Your hand pain or discomfort does not improve within 2 hours after you exercise. If you have either of these problems, stop doing these exercises right away. Do not do them again unless your provider says that you can. Get help right away if: You develop sudden, severe hand pain or swelling. If this happens, stop doing these exercises right away. Do not do them again unless your provider says that you can. This information is not intended to replace advice given to you by your health care provider. Make sure you discuss any questions you have with your health care provider. Document Revised: 09/08/2022 Document Reviewed: 09/08/2022 Elsevier Patient Education  2024 [employer] Heart Disease Prevention   Your inflammatory disease increases your risk of heart disease which includes heart attack, stroke, atrial fibrillation (irregular heartbeats), high blood pressure, heart failure and atherosclerosis (plaque in the arteries).  It is important to  reduce your risk by:   Keep blood pressure, cholesterol, and blood sugar at healthy levels   Smoking Cessation   Maintain a healthy weight  BMI 20-25   Eat a healthy diet  Plenty of fresh fruit, vegetables, and whole grains  Limit saturated fats, foods high in sodium, and added sugars  DASH and Mediterranean diet   Increase physical activity  Recommend moderate physically activity for 150 minutes per week/ 30 minutes a day for five days a week These can be broken up into three separate ten-minute sessions during the day.   Reduce Stress  Meditation, slow breathing exercises, yoga, coloring books  Dental visits twice a year

## 2023-07-28 ENCOUNTER — Encounter: Payer: Self-pay | Admitting: Medical

## 2023-07-28 ENCOUNTER — Ambulatory Visit (INDEPENDENT_AMBULATORY_CARE_PROVIDER_SITE_OTHER): Payer: Medicaid Other | Admitting: Medical

## 2023-07-28 VITALS — BP 120/80 | HR 75 | Resp 18 | Ht 65.0 in | Wt 175.0 lb

## 2023-07-28 DIAGNOSIS — R700 Elevated erythrocyte sedimentation rate: Secondary | ICD-10-CM | POA: Diagnosis not present

## 2023-07-28 DIAGNOSIS — M797 Fibromyalgia: Secondary | ICD-10-CM

## 2023-07-28 DIAGNOSIS — R768 Other specified abnormal immunological findings in serum: Secondary | ICD-10-CM | POA: Diagnosis not present

## 2023-07-28 DIAGNOSIS — M2550 Pain in unspecified joint: Secondary | ICD-10-CM | POA: Diagnosis not present

## 2023-07-28 MED ORDER — DULOXETINE HCL 30 MG PO CPEP: 30.0000 mg | ORAL_CAPSULE | Freq: Every day | ORAL | 0 refills | Status: DC

## 2023-07-28 NOTE — Patient Instructions (Addendum)
Fibromyalgia suspected by rheumatologist(on review I do think this is possible) -will go ahead and rx cymbalta 30 mg daily. Rx advisement given.   Arthralgia, unspecified joint, Positive ANA (antinuclear antibody) and Elevated sed rate -full work up from rheumatologist pending. They usually update/send report/work up when done.  Do recommend you follow up with cardiologist. Please go ahead and schedule.  Follow up in one month or sooner if needed.
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
## 2023-07-29 ENCOUNTER — Other Ambulatory Visit: Payer: Self-pay | Admitting: Medical

## 2023-07-29 DIAGNOSIS — Z1231 Encounter for screening mammogram for malignant neoplasm of breast: Secondary | ICD-10-CM

## 2023-07-30 LAB — PROTEIN ELECTROPHORESIS, SERUM, WITH REFLEX
Albumin ELP: 4.3 g/dL (ref 3.8–4.8)
Alpha 1: 0.4 g/dL — ABNORMAL HIGH (ref 0.2–0.3)
Alpha 2: 0.8 g/dL (ref 0.5–0.9)
Beta 2: 0.5 g/dL (ref 0.2–0.5)
Beta Globulin: 0.5 g/dL (ref 0.4–0.6)
Gamma Globulin: 1.1 g/dL (ref 0.8–1.7)
Total Protein: 7.5 g/dL (ref 6.1–8.1)

## 2023-07-30 LAB — SEDIMENTATION RATE: Sed Rate: 36 mm/h — ABNORMAL HIGH (ref 0–20)

## 2023-07-30 LAB — CK: Total CK: 82 U/L (ref 29–143)

## 2023-07-30 LAB — TSH: TSH: 1.76 m[IU]/L

## 2023-07-30 NOTE — Progress Notes (Signed)
Sedimentation rate is elevated but better than before.  SPEP is normal.  CK and TSH are normal.  I will discuss results at the follow-up visit.

## 2023-08-02 ENCOUNTER — Encounter: Payer: Self-pay | Admitting: Rheumatology

## 2023-08-07 ENCOUNTER — Encounter: Payer: Self-pay | Admitting: Medical

## 2023-08-08 ENCOUNTER — Other Ambulatory Visit: Payer: Medicaid Other

## 2023-08-10 ENCOUNTER — Ambulatory Visit: Payer: Medicaid Other | Admitting: Medical

## 2023-08-10 ENCOUNTER — Other Ambulatory Visit: Payer: Self-pay | Admitting: Medical

## 2023-08-10 NOTE — Telephone Encounter (Signed)
Rx refill zofran and ibuprofen sent to pharmacy.

## 2023-08-12 DIAGNOSIS — M19042 Primary osteoarthritis, left hand: Secondary | ICD-10-CM | POA: Insufficient documentation

## 2023-08-12 DIAGNOSIS — M19071 Primary osteoarthritis, right ankle and foot: Secondary | ICD-10-CM | POA: Insufficient documentation

## 2023-08-12 DIAGNOSIS — M19041 Primary osteoarthritis, right hand: Secondary | ICD-10-CM

## 2023-08-12 HISTORY — DX: Primary osteoarthritis, left ankle and foot: M19.071

## 2023-08-12 HISTORY — DX: Primary osteoarthritis, right hand: M19.041
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
## 2023-08-16 ENCOUNTER — Ambulatory Visit (INDEPENDENT_AMBULATORY_CARE_PROVIDER_SITE_OTHER): Payer: Medicaid Other | Admitting: Medical

## 2023-08-16 VITALS — BP 110/66 | HR 76 | Resp 18 | Ht 64.0 in | Wt 177.6 lb

## 2023-08-16 DIAGNOSIS — M797 Fibromyalgia: Secondary | ICD-10-CM | POA: Diagnosis not present

## 2023-08-16 MED ORDER — SAVELLA TITRATION PACK 12.5 & 25 & 50 MG PO MISC
ORAL | 0 refills | Status: AC
  Filled 2023-08-19: qty 55, 28d supply, fill #0

## 2023-08-16 MED ORDER — SAVELLA TITRATION PACK 12.5 & 25 & 50 MG PO MISC: ORAL | 0 refills | Status: DC

## 2023-08-16 NOTE — Progress Notes (Signed)
Subjective:    Patient ID: Phyllis Adams, female    DOB: May 19, 1976, 47 y.o.   MRN: [id number]  HPI   Discussed the use of AI scribe software for clinical note transcription with the patient, who gave verbal consent to proceed.  History of Present Illness   The patient, previously diagnosed with probable fibromyalgia, was prescribed Cymbalta 30mg  daily. She reported that while on the medication, the intensity of her pain, particularly in the feet, was reduced. However, she experienced significant side effects including nausea and loss of appetite. These side effects were severe enough to lead the patient to discontinue the medication temporarily to enjoy Thanksgiving. Upon discontinuation, the nausea subsided and the appetite returned.  The patient also reported experiencing hot flashes, for which she was prescribed birth control patches by another physician. The effectiveness of this treatment is yet to be determined.  The patient's pain is described as multiple joint pains, symmetric back pain along the spine, shoulder pain, and burning sensations in the feet. The pain in the feet is particularly exacerbated by standing for more than four hours. The patient describes these episodes as "flare-ups," during which the pain and discomfort intensify. In between these flare-ups, the symptoms are relatively calm. The patient's symptoms and her response to Cymbalta align with the diagnosis of fibromyalgia.          Review of Systems  Constitutional:  Negative for chills, fatigue and fever.  Respiratory:  Negative for cough, chest tightness, wheezing and stridor.   Cardiovascular:  Negative for chest pain and palpitations.  Gastrointestinal:  Negative for abdominal pain, blood in stool, nausea and vomiting.  Musculoskeletal:  Positive for arthralgias and myalgias. Negative for neck pain.  Neurological:  Negative for dizziness, weakness and light-headedness.  Hematological:  Negative for  adenopathy.  Psychiatric/Behavioral:  Negative for behavioral problems and confusion.     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   Retained orthopedic hardware 01/16/2016   Right leg pain 07/16/2016   Segmental and somatic dysfunction of 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   Spasm of muscle 04/15/2016     Social History   Socioeconomic History   Marital status: Married    Spouse name: Not on file   Number of children: Not on file   Years of education: Not on file   Highest education level: Bachelor's degree (e.g., BA, AB, BS)  Occupational History   Not on file  Tobacco Use   Smoking status: Every Day    Types: Cigars, Cigarettes   Smokeless tobacco: Never   Tobacco comments:    3 cigars with marijuana    Smokes cigarettes when driving  Vaping Use   Vaping status: Never Used  Substance and Sexual Activity   Alcohol use: No   Drug use: Yes    Types: Marijuana   Sexual activity: Yes    Birth control/protection: Other-see comments, Surgical  Other Topics Concern   Not on file  Social History Narrative   Are you right handed or left handed? Right Handed   Are  you currently employed ? 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 No    Who lives with you? Kids - adult age    What type of home do you live in: 1 story or 2 story? Lives in a one story home        Social Determinants of Health   Financial Resource Strain: Low Risk  (07/12/2023)   Overall Financial Resource Strain (CARDIA)    Difficulty of Paying  Living Expenses: Not very hard  Food Insecurity: Patient Declined (07/12/2023)   Hunger Vital Sign    Worried About Running Out of Food in the Last Year: Patient declined    Ran Out of Food in the Last Year: Patient declined  Transportation Needs: No Transportation Needs (07/12/2023)   PRAPARE - Administrator, Civil Service (Medical): No    Lack of Transportation (Non-Medical): No  Physical Activity: Sufficiently Active (07/12/2023)   Exercise Vital Sign    Days of Exercise per Week: 7 days    Minutes of Exercise per Session: 30 min  Stress: Stress Concern Present (07/12/2023)   [employer] of Occupational Health - Occupational Stress Questionnaire    Feeling of Stress : Rather much  Social Connections: Socially Isolated (07/12/2023)   Social Connection and Isolation Panel [NHANES]    Frequency of Communication with Friends and Family: Once a week    Frequency of Social Gatherings with Friends and Family: Never    Attends Religious Services: Never    Database administrator or Organizations: No    Attends Engineer, structural: Not on file    Marital Status: Married  Catering manager Violence: Not on file    Past Surgical History:  Procedure Laterality Date   APPENDECTOMY     CHOLECYSTECTOMY     FEMUR FRACTURE SURGERY Right 2006   FEMUR IM ROD REMOVAL Right 03/11/2016   TUBAL LIGATION      Family History  Problem Relation Age of Onset   Hypertension Mother    Hypertension Maternal Aunt    Hypertension Paternal Uncle    Hypertension Maternal Grandmother    Hypertension Maternal Grandfather     Allergies  Allergen Reactions   Tramadol Shortness Of Breath and Nausea And Vomiting   Hydrocodone-Acetaminophen Other (See Comments)    Sweats, heavy breathing   Prednisone Other (See Comments)    Headache , nausea , abd pain   Acetaminophen-Codeine Nausea Only    Current Outpatient Medications on File Prior to Visit  Medication Sig Dispense Refill    albuterol (VENTOLIN HFA) 108 (90 Base) MCG/ACT inhaler INHALE 2 PUFFS INTO THE LUNGS EVERY 6 HOURS AS NEEDED FOR WHEEZING OR SHORTNESS OF BREATH 18 g 0   ALPRAZolam (XANAX) 0.5 MG tablet 1 tab po30 minutes  prior to dental procedure for anxiety 3 tablet 0   amLODipine (NORVASC) 10 MG tablet 1 tab po q day 90 tablet 3   butalbital-aspirin-caffeine (FIORINAL) 50-325-40 MG capsule Take 1 capsule by mouth every 6 (six) hours as needed for headache. 12 capsule 0   fluticasone (FLONASE) 50 MCG/ACT nasal spray Place 2 sprays into both nostrils daily. 16 g 1   ibuprofen (ADVIL) 800 MG tablet TAKE 1 TABLET(800 MG) BY MOUTH EVERY 8 HOURS AS NEEDED FOR MODERATE PAIN 30 tablet 0   losartan (COZAAR) 25 MG tablet TAKE 1 TABLET(25 MG) BY MOUTH DAILY 30 tablet 11   ondansetron (ZOFRAN-ODT) 8 MG disintegrating  tablet DISSOLVE 1 TABLET(8 MG) ON THE TONGUE EVERY 8 HOURS AS NEEDED FOR NAUSEA OR VOMITING 15 tablet 0   SUMAtriptan (IMITREX) 50 MG tablet TAKE 1 TABLET BY MOUTH AT ONSET OF MIGRAINE. MAY REPEAT IN 2 HOURS IF HEADACHE PERSISTS OR RECURS 10 tablet 2   tiZANidine (ZANAFLEX) 4 MG tablet Take 1 tablet (4 mg total) by mouth every 6 (six) hours as needed for muscle spasms. 30 tablet 0   No current facility-administered medications on file prior to visit.    BP 110/66   Pulse 76   Resp 18   Ht 5\' 4"  (1.626 m)   Wt 177 lb 9.6 oz (80.6 kg)   SpO2 100%   BMI 30.48 kg/m       Objective:   Physical Exam  General Mental Status- Alert. General Appearance- Not in acute distress.    Skin General: Color- Normal Color. Moisture- Normal Moisture.   Neck Carotid Arteries- Normal color. Moisture- Normal Moisture. No carotid bruits. No JVD.   Chest and Lung Exam Auscultation: Breath Sounds:-Normal.   Cardiovascular Auscultation:Rythm- Regular. Murmurs & Other Heart Sounds:Auscultation of the heart reveals- No Murmurs.   Abdomen Inspection:-Inspeection Normal. Palpation/Percussion:Note:No mass.  Palpation and Percussion of the abdomen reveal- Non Tender, Non Distended + BS, no rebound or guarding.     Neurologic Cranial Nerve exam:- CN III-XII intact(No nystagmus), symmetric smile. Strength:- 5/5 equal and symmetric strength both upper and lower extremities.    Back- symmetric area of tendernss running length of back on palpation. No mid spinal pain.      Assessment & Plan:   Assessment and Plan    Fibromyalgia Symmetrical back pain, shoulder pain, and burning feet. Pain intensity reduced with Cymbalta 30mg  daily, but discontinued due to side effects of nausea and loss of appetite. -Discontinue Cymbalta. -Start Savella titration pack. -Continue follow-up with rheumatologist. -Consider swimming and stretching exercises as recommended by rheumatologist.  Hot Flashes Initiated on birth control patches by another provider. -Continue birth control patches as prescribed. -Follow-up with prescribing provider in 3 months.  Follow-up in 2-3 weeks after starting Savella or sooner if needed.        Esperanza Richters, PA-C

## 2023-08-16 NOTE — Patient Instructions (Signed)
Fibromyalgia Symmetrical back pain, shoulder pain, and burning feet. Pain intensity reduced with Cymbalta 30mg  daily, but discontinued due to side effects of nausea and loss of appetite. -Discontinue Cymbalta. -Start Savella titration pack. -Continue follow-up with rheumatologist. -Consider swimming and stretching exercises as recommended by rheumatologist.  Hot Flashes Initiated on birth control patches by another provider. -Continue birth control patches as prescribed. -Follow-up with prescribing provider in 3 months.  Follow-up in 2-3 weeks after starting Savella or sooner if needed.

## 2023-08-19 ENCOUNTER — Encounter: Payer: Self-pay | Admitting: Cardiology

## 2023-08-19 ENCOUNTER — Other Ambulatory Visit (HOSPITAL_BASED_OUTPATIENT_CLINIC_OR_DEPARTMENT_OTHER): Payer: Self-pay

## 2023-08-19 ENCOUNTER — Ambulatory Visit: Payer: Medicaid Other | Attending: Cardiology | Admitting: Cardiology

## 2023-08-19 VITALS — BP 120/78 | HR 69 | Ht <= 58 in | Wt 177.0 lb

## 2023-08-19 DIAGNOSIS — F17210 Nicotine dependence, cigarettes, uncomplicated: Secondary | ICD-10-CM | POA: Diagnosis present

## 2023-08-19 DIAGNOSIS — I259 Chronic ischemic heart disease, unspecified: Secondary | ICD-10-CM | POA: Diagnosis present

## 2023-08-19 DIAGNOSIS — E66811 Obesity, class 1: Secondary | ICD-10-CM | POA: Insufficient documentation

## 2023-08-19 DIAGNOSIS — R072 Precordial pain: Secondary | ICD-10-CM | POA: Insufficient documentation

## 2023-08-19 DIAGNOSIS — I10 Essential (primary) hypertension: Secondary | ICD-10-CM | POA: Insufficient documentation

## 2023-08-19 DIAGNOSIS — I209 Angina pectoris, unspecified: Secondary | ICD-10-CM | POA: Diagnosis present

## 2023-08-19 NOTE — Patient Instructions (Signed)
Medication Instructions:  Your physician recommends that you continue on your current medications as directed. Please refer to the Current Medication list given to you today.   *If you need a refill on your cardiac medications before your next appointment, please call your pharmacy*   Lab Work: None ordered If you have labs (blood work) drawn today and your tests are completely normal, you will receive your results only by: MyChart Message (if you have MyChart) OR A paper copy in the mail If you have any lab test that is abnormal or we need to change your treatment, we will call you to review the results.   Testing/Procedures:   [facility] Cardiovascular Imaging at [facility] [address], [address] [place], [place] [address] Phone: [phone]    Please arrive 15 minutes prior to your appointment time for registration and insurance purposes.  The test will take approximately 3 to 4 hours to complete; you may bring reading material.  If someone comes with you to your appointment, they will need to remain in the main lobby due to limited space in the testing area. **If you are pregnant or breastfeeding, please notify the nuclear lab prior to your appointment**  How to prepare for your Myocardial Perfusion Test: Do not eat or drink 3 hours prior to your test, except you may have water. Do not consume products containing caffeine (regular or decaffeinated) 12 hours prior to your test. (ex: coffee, chocolate, sodas, tea). Do bring a list of your current medications with you.  If not listed below, you may take your medications as normal. Do wear comfortable clothes (no dresses or overalls) and walking shoes, tennis shoes preferred (No heels or open toe shoes are allowed). Do NOT wear cologne, perfume, aftershave, or lotions (deodorant is allowed). If these instructions are not followed, your test will have to be rescheduled.  If you cannot keep your appointment, please  provide 24 hours notification to the Nuclear Lab, to avoid a possible $50 charge to your account. Please report to [address] for your test.  If you have questions or concerns about your appointment, you can call the Nuclear Lab at [phone].   If you cannot keep your appointment, please provide 24 hours notification to the Nuclear Lab, to avoid a possible $50 charge to your account.    Your physician has requested that you have an echocardiogram. Echocardiography is a painless test that uses sound waves to create images of your heart. It provides your doctor with information about the size and shape of your heart and how well your heart's chambers and valves are working. This procedure takes approximately one hour. There are no restrictions for this procedure. Please do NOT wear cologne, perfume, aftershave, or lotions (deodorant is allowed). Please arrive 15 minutes prior to your appointment time.  Please note: We ask at that you not bring children with you during ultrasound (echo/ vascular) testing. Due to room size and safety concerns, children are not allowed in the ultrasound rooms during exams. Our front office staff cannot provide observation of children in our lobby area while testing is being conducted. An adult accompanying a patient to their appointment will only be allowed in the ultrasound room at the discretion of the ultrasound technician under special circumstances. We apologize for any inconvenience.    Follow-Up: At [facility], you and your health needs are our priority.  As part of our continuing mission to provide you with exceptional heart  care, we have created designated Provider Care Teams.  These Care Teams include your primary Cardiologist (physician) and Advanced Practice Providers (APPs -  Physician Assistants and Nurse Practitioners) who all work together to provide you with the care you need, when you need it.  We recommend signing up  for the patient portal called "MyChart".  Sign up information is provided on this After Visit Summary.  MyChart is used to connect with patients for Virtual Visits (Telemedicine).  Patients are able to view lab/test results, encounter notes, upcoming appointments, etc.  Non-urgent messages can be sent to your provider as well.   To learn more about what you can do with MyChart, go to [email or web address].    Your next appointment:   12 month(s)  Provider:   Belva Crome, MD   Other Instructions  Cardiac Nuclear Scan A cardiac nuclear scan is a test that is done to check the flow of blood to your heart. It is done when you are resting and when you are exercising. The test looks for problems such as: Not enough blood reaching a portion of the heart. The heart muscle not working as it should. You may need this test if you have: Heart disease. Lab results that are not normal. Had heart surgery or a balloon procedure to open up blocked arteries (angioplasty) or a small mesh tube (stent). Chest pain. Shortness of breath. Had a heart attack. In this test, a special dye (tracer) is put into your bloodstream. The tracer will travel to your heart. A camera will then take pictures of your heart to see how the tracer moves through your heart. This test is usually done at a hospital and takes 2-4 hours. Tell a doctor about: Any allergies you have. All medicines you are taking, including vitamins, herbs, eye drops, creams, and over-the-counter medicines. Any bleeding problems you have. Any surgeries you have had. Any medical conditions you have. Whether you are pregnant or may be pregnant. Any history of asthma or long-term (chronic) lung disease. Any history of heart rhythm disorders or heart valve conditions. What are the risks? Your doctor will talk with you about risks. These may include: Serious chest pain and heart attack. This is only a risk if the stress portion of the test is  done. Fast or uneven heartbeats (palpitations). A feeling of warmth in your chest. This feeling usually does not last long. Allergic reaction to the tracer. Shortness of breath or trouble breathing. What happens before the test? Ask your doctor about changing or stopping your normal medicines. Follow instructions from your doctor about what you cannot eat or drink. Remove your jewelry on the day of the test. Ask your doctor if you need to avoid nicotine or caffeine. What happens during the test? An IV tube will be inserted into one of your veins. Your doctor will give you a small amount of tracer through the IV tube. You will wait for 20-40 minutes while the tracer moves through your bloodstream. Your heart will be monitored with an electrocardiogram (ECG). You will lie down on an exam table. Pictures of your heart will be taken for about 15-20 minutes. You may also have a stress test. For this test, one of these things may be done: You will be asked to exercise on a treadmill or a stationary bike. You will be given medicines that will make your heart work harder. This is done if you are unable to exercise. When blood flow to your heart has peaked, a  tracer will again be given through the IV tube. After 20-40 minutes, you will get back on the exam table. More pictures will be taken of your heart. Depending on the tracer that is used, more pictures may need to be taken 3-4 hours later. Your IV tube will be removed when the test is over. The test may vary among doctors and hospitals. What happens after the test? Ask your doctor: Whether you can return to your normal schedule, including diet, activities, travel, and medicines. Whether you should drink more fluids. This will help to remove the tracer from your body. Ask your doctor, or the department that is doing the test: When will my results be ready? How will I get my results? What are my treatment options? What other tests do I  need? What are my next steps? This information is not intended to replace advice given to you by your health care provider. Make sure you discuss any questions you have with your health care provider. Document Revised: 01/20/2022 Document Reviewed: 01/20/2022 Elsevier Patient Education  2023 Elsevier Inc.  Echocardiogram An echocardiogram is a test that uses sound waves (ultrasound) to produce images of the heart. Images from an echocardiogram can provide important information about: Heart size and shape. The size and thickness and movement of your heart's walls. Heart muscle function and strength. Heart valve function or if you have stenosis. Stenosis is when the heart valves are too narrow. If blood is flowing backward through the heart valves (regurgitation). A tumor or infectious growth around the heart valves. Areas of heart muscle that are not working well because of poor blood flow or injury from a heart attack. Aneurysm detection. An aneurysm is a weak or damaged part of an artery wall. The wall bulges out from the normal force of blood pumping through the body. Tell a health care provider about: Any allergies you have. All medicines you are taking, including vitamins, herbs, eye drops, creams, and over-the-counter medicines. Any blood disorders you have. Any surgeries you have had. Any medical conditions you have. Whether you are pregnant or may be pregnant. What are the risks? Generally, this is a safe test. However, problems may occur, including an allergic reaction to dye (contrast) that may be used during the test. What happens before the test? No specific preparation is needed. You may eat and drink normally. What happens during the test?  You will take off your clothes from the waist up and put on a hospital gown. Electrodes or electrocardiogram (ECG)patches may be placed on your chest. The electrodes or patches are then connected to a device that monitors your heart  rate and rhythm. You will lie down on a table for an ultrasound exam. A gel will be applied to your chest to help sound waves pass through your skin. A handheld device, called a transducer, will be pressed against your chest and moved over your heart. The transducer produces sound waves that travel to your heart and bounce back (or "echo" back) to the transducer. These sound waves will be captured in real-time and changed into images of your heart that can be viewed on a video monitor. The images will be recorded on a computer and reviewed by your health care provider. You may be asked to change positions or hold your breath for a short time. This makes it easier to get different views or better views of your heart. In some cases, you may receive contrast through an IV in one of your veins.  This can improve the quality of the pictures from your heart. The procedure may vary among health care providers and hospitals. What can I expect after the test? You may return to your normal, everyday life, including diet, activities, and medicines, unless your health care provider tells you not to do that. Follow these instructions at home: It is up to you to get the results of your test. Ask your health care provider, or the department that is doing the test, when your results will be ready. Keep all follow-up visits. This is important. Summary An echocardiogram is a test that uses sound waves (ultrasound) to produce images of the heart. Images from an echocardiogram can provide important information about the size and shape of your heart, heart muscle function, heart valve function, and other possible heart problems. You do not need to do anything to prepare before this test. You may eat and drink normally. After the echocardiogram is completed, you may return to your normal, everyday life, unless your health care provider tells you not to do that. This information is not intended to replace advice given to  you by your health care provider. Make sure you discuss any questions you have with your health care provider. Document Revised: 05/07/2021 Document Reviewed: 04/16/2020 Elsevier Patient Education  2023 Elsevier Inc.    Important Information About Sugar

## 2023-08-19 NOTE — Progress Notes (Signed)
Cardiology Office Note:    Date:  08/19/2023   ID:  Phyllis Adams, DOB 1975-11-24, MRN [id number]  PCP:  Esperanza Richters, PA-C  Cardiologist:  Garwin Brothers, MD   Referring MD: Esperanza Richters, PA-C    ASSESSMENT:    1. Primary hypertension   2. Chest pain due to myocardial ischemia, unspecified ischemic chest pain type   3. Angina pectoris (HCC)   4. Cigarette smoker   5. Obesity (BMI 30.0-34.9)    PLAN:    In order of problems listed above:  Primary prevention stressed with the patient.  Importance of compliance with diet medication stressed and patient verbalized standing. Cigarette smoker: I spent 5 minutes with the patient discussing solely about smoking. Smoking cessation was counseled. I suggested to the patient also different medications and pharmacological interventions. Patient is keen to try stopping on its own at this time. He will get back to me if he needs any further assistance in this matter. Essential hypertension: Blood pressure is stable and diet was emphasized.  Lifestyle modification urged.  Salt intake issues were discussed. Chest pain: Concerning.  She is agreeable on an exercise stress Cardiolite and we will plan this for her. Cardiac murmur: Echocardiogram will be done to assess murmur heard on auscultation. Patient will be seen in follow-up appointment in 6 months or earlier if the patient has any concerns.    Medication Adjustments/Labs and Tests Ordered: Current medicines are reviewed at length with the patient today.  Concerns regarding medicines are outlined above.  Orders Placed This Encounter  Procedures   EKG 12-Lead   No orders of the defined types were placed in this encounter.    No chief complaint on file.    History of Present Illness:    Phyllis Adams is a 47 y.o. female.  Patient was evaluated by me for chest pain and a CT coronary angiography was planned.  Patient has not kept up that appointment because of claustrophobia.   Unfortunately she continues to smoke.  She has been evaluated for inflammatory conditions such as rheumatoid arthritis according to the history that she provides me.  She has history of hypertension.  She works at [employer] and mentions to me that she occasionally has chest discomfort which may or may not be related to exertion.  She has discomfort all over the body at times.  Unfortunately she continues to smoke.  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   Primary osteoarthritis of both feet 08/12/2023   Primary osteoarthritis of both hands 08/12/2023   Clinical and radiographic findings were suggestive of osteoarthritis.  No synovitis was noted.     Retained orthopedic hardware 01/16/2016   Right leg pain 07/16/2016   Segmental and somatic dysfunction of 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   Spasm of muscle 04/15/2016    Past Surgical History:  Procedure Laterality Date   APPENDECTOMY     CHOLECYSTECTOMY     FEMUR FRACTURE SURGERY Right 2006   FEMUR IM ROD REMOVAL Right 03/11/2016   TUBAL LIGATION  Current Medications: Current Meds  Medication Sig   albuterol (VENTOLIN HFA) 108 (90 Base) MCG/ACT inhaler INHALE 2 PUFFS INTO THE LUNGS EVERY 6 HOURS AS NEEDED FOR WHEEZING OR SHORTNESS OF BREATH   ALPRAZolam (XANAX) 0.5 MG tablet 1 tab po30 minutes  prior to dental procedure for anxiety   amLODipine (NORVASC) 10 MG tablet 1 tab po q day   butalbital-aspirin-caffeine (FIORINAL) 50-325-40 MG capsule Take 1 capsule by mouth every 6 (six) hours as needed for  headache.   fluticasone (FLONASE) 50 MCG/ACT nasal spray Place 2 sprays into both nostrils daily.   ibuprofen (ADVIL) 800 MG tablet TAKE 1 TABLET(800 MG) BY MOUTH EVERY 8 HOURS AS NEEDED FOR MODERATE PAIN   losartan (COZAAR) 25 MG tablet TAKE 1 TABLET(25 MG) BY MOUTH DAILY   Milnacipran HCl (SAVELLA TITRATION PACK) 12.5 & 25 & 50 MG MISC Day 1 12.5 mg po for 1 day. Day 2 and day 3 12.5 mg twice daily. Day 4-7  start 25 mg twice daily. Day 8 start 50 mg twice daily.   ondansetron (ZOFRAN-ODT) 8 MG disintegrating tablet DISSOLVE 1 TABLET(8 MG) ON THE TONGUE EVERY 8 HOURS AS NEEDED FOR NAUSEA OR VOMITING   SUMAtriptan (IMITREX) 50 MG tablet TAKE 1 TABLET BY MOUTH AT ONSET OF MIGRAINE. MAY REPEAT IN 2 HOURS IF HEADACHE PERSISTS OR RECURS   tiZANidine (ZANAFLEX) 4 MG tablet Take 1 tablet (4 mg total) by mouth every 6 (six) hours as needed for muscle spasms.     Allergies:   Tramadol, Hydrocodone-acetaminophen, Prednisone, and Acetaminophen-codeine   Social History   Socioeconomic History   Marital status: Married    Spouse name: Not on file   Number of children: Not on file   Years of education: Not on file   Highest education level: Bachelor's degree (e.g., BA, AB, BS)  Occupational History   Not on file  Tobacco Use   Smoking status: Every Day    Types: Cigars, Cigarettes   Smokeless tobacco: Never   Tobacco comments:    3 cigars with marijuana    Smokes cigarettes when driving  Vaping Use   Vaping status: Never Used  Substance and Sexual Activity   Alcohol use: No   Drug use: Yes    Types: Marijuana   Sexual activity: Yes    Birth control/protection: Other-see comments, Surgical  Other Topics Concern   Not on file  Social History Narrative   Are you right handed or left handed? Right Handed   Are you currently employed ? 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 No    Who lives with you? Kids - adult age    What type of home do you  live in: 1 story or 2 story? Lives in a one story home        Social Drivers of Health   Financial Resource Strain: Low Risk  (07/12/2023)   Overall Financial Resource Strain (CARDIA)    Difficulty of Paying Living Expenses: Not very hard  Food Insecurity: Patient Declined (07/12/2023)   Hunger Vital Sign    Worried About Running Out of Food in the Last Year: Patient declined    Ran Out of Food in the Last Year: Patient declined  Transportation Needs: No Transportation Needs (07/12/2023)   PRAPARE - Administrator, Civil Service (Medical): No    Lack of Transportation (Non-Medical): No  Physical Activity: Sufficiently Active (07/12/2023)   Exercise  Vital Sign    Days of Exercise per Week: 7 days    Minutes of Exercise per Session: 30 min  Stress: Stress Concern Present (07/12/2023)   [employer] of Occupational Health - Occupational Stress Questionnaire    Feeling of Stress : Rather much  Social Connections: Socially Isolated (07/12/2023)   Social Connection and Isolation Panel [NHANES]    Frequency of Communication with Friends and Family: Once a week    Frequency of Social Gatherings with Friends and Family: Never    Attends Religious Services: Never    Database administrator or Organizations: No    Attends Engineer, structural: Not on file    Marital Status: Married     Family History: The patient's family history includes Hypertension in her maternal aunt, maternal grandfather, maternal grandmother, mother, and paternal uncle.  ROS:   Please see the history of present illness.    All other systems reviewed and are negative.  EKGs/Labs/Other Studies Reviewed:    The following studies were reviewed today: .Marland KitchenEKG Interpretation Date/Time:  Thursday August 19 2023 10:03:04 EST Ventricular Rate:  69 PR Interval:  160 QRS Duration:  76 QT Interval:  416 QTC Calculation: 445 R Axis:   1  Text Interpretation: Normal sinus rhythm Minimal voltage  criteria for LVH, may be normal variant ( R in aVL ) When compared with ECG of 13-Jun-2017 08:41, PREVIOUS ECG IS PRESENT Confirmed by Belva Crome [id number]) on 08/19/2023 10:27:56 AM     Recent Labs: 05/19/2023: ALT 10; BUN 11; Creatinine, Ser 0.72; Hemoglobin 12.6; Platelets 271.0; Potassium 4.1; Sodium 140 07/27/2023: TSH 1.76  Recent Lipid Panel    Component Value Date/Time   CHOL 131 05/19/2023 1342   TRIG 64.0 05/19/2023 1342   HDL 47.70 05/19/2023 1342   CHOLHDL 3 05/19/2023 1342   VLDL 12.8 05/19/2023 1342   LDLCALC 70 05/19/2023 1342   LDLCALC 76 05/21/2020 1052    Physical Exam:    VS:  BP 120/78   Pulse 69   Ht 4\' 10"  (1.473 m)   Wt 177 lb 0.6 oz (80.3 kg)   SpO2 99%   BMI 37.00 kg/m     Wt Readings from Last 3 Encounters:  08/19/23 177 lb 0.6 oz (80.3 kg)  08/16/23 177 lb 9.6 oz (80.6 kg)  07/28/23 175 lb (79.4 kg)     GEN: Patient is in no acute distress HEENT: Normal NECK: No JVD; No carotid bruits LYMPHATICS: No lymphadenopathy CARDIAC: Hear sounds regular, 2/6 systolic murmur at the apex. RESPIRATORY:  Clear to auscultation without rales, wheezing or rhonchi  ABDOMEN: Soft, non-tender, non-distended MUSCULOSKELETAL:  No edema; No deformity  SKIN: Warm and dry NEUROLOGIC:  Alert and oriented x 3 PSYCHIATRIC:  Normal affect   Signed, Garwin Brothers, MD  08/19/2023 10:37 AM    [facility]

## 2023-08-20 ENCOUNTER — Other Ambulatory Visit (HOSPITAL_BASED_OUTPATIENT_CLINIC_OR_DEPARTMENT_OTHER): Payer: Self-pay

## 2023-08-23 ENCOUNTER — Other Ambulatory Visit (HOSPITAL_BASED_OUTPATIENT_CLINIC_OR_DEPARTMENT_OTHER): Payer: Self-pay

## 2023-08-24 ENCOUNTER — Other Ambulatory Visit (HOSPITAL_BASED_OUTPATIENT_CLINIC_OR_DEPARTMENT_OTHER): Payer: Self-pay

## 2023-08-26 ENCOUNTER — Ambulatory Visit: Payer: Medicaid Other | Attending: Rheumatology | Admitting: Rheumatology

## 2023-08-26 ENCOUNTER — Encounter: Payer: Self-pay | Admitting: Rheumatology

## 2023-08-26 ENCOUNTER — Other Ambulatory Visit (HOSPITAL_BASED_OUTPATIENT_CLINIC_OR_DEPARTMENT_OTHER): Payer: Self-pay

## 2023-08-26 ENCOUNTER — Ambulatory Visit
Admission: RE | Admit: 2023-08-26 | Discharge: 2023-08-26 | Disposition: A | Discharge status: Home / Self Care | Payer: Medicaid Other | Source: Ambulatory Visit

## 2023-08-26 VITALS — BP 146/93 | HR 74 | Resp 16 | Ht <= 58 in | Wt 176.2 lb

## 2023-08-26 DIAGNOSIS — M19072 Primary osteoarthritis, left ankle and foot: Secondary | ICD-10-CM | POA: Diagnosis present

## 2023-08-26 DIAGNOSIS — F17210 Nicotine dependence, cigarettes, uncomplicated: Secondary | ICD-10-CM | POA: Diagnosis present

## 2023-08-26 DIAGNOSIS — Z1231 Encounter for screening mammogram for malignant neoplasm of breast: Secondary | ICD-10-CM

## 2023-08-26 DIAGNOSIS — M47816 Spondylosis without myelopathy or radiculopathy, lumbar region: Secondary | ICD-10-CM | POA: Diagnosis present

## 2023-08-26 DIAGNOSIS — I209 Angina pectoris, unspecified: Secondary | ICD-10-CM | POA: Insufficient documentation

## 2023-08-26 DIAGNOSIS — E66811 Obesity, class 1: Secondary | ICD-10-CM | POA: Diagnosis present

## 2023-08-26 DIAGNOSIS — Z8669 Personal history of other diseases of the nervous system and sense organs: Secondary | ICD-10-CM | POA: Insufficient documentation

## 2023-08-26 DIAGNOSIS — M2550 Pain in unspecified joint: Secondary | ICD-10-CM | POA: Insufficient documentation

## 2023-08-26 DIAGNOSIS — Z8781 Personal history of (healed) traumatic fracture: Secondary | ICD-10-CM | POA: Insufficient documentation

## 2023-08-26 DIAGNOSIS — F32A Depression, unspecified: Secondary | ICD-10-CM | POA: Insufficient documentation

## 2023-08-26 DIAGNOSIS — R5383 Other fatigue: Secondary | ICD-10-CM | POA: Diagnosis present

## 2023-08-26 DIAGNOSIS — M797 Fibromyalgia: Secondary | ICD-10-CM | POA: Insufficient documentation

## 2023-08-26 DIAGNOSIS — G8929 Other chronic pain: Secondary | ICD-10-CM | POA: Diagnosis present

## 2023-08-26 DIAGNOSIS — R768 Other specified abnormal immunological findings in serum: Secondary | ICD-10-CM | POA: Insufficient documentation

## 2023-08-26 DIAGNOSIS — I10 Essential (primary) hypertension: Secondary | ICD-10-CM | POA: Insufficient documentation

## 2023-08-26 DIAGNOSIS — R202 Paresthesia of skin: Secondary | ICD-10-CM | POA: Insufficient documentation

## 2023-08-26 DIAGNOSIS — F5101 Primary insomnia: Secondary | ICD-10-CM | POA: Diagnosis present

## 2023-08-26 DIAGNOSIS — F419 Anxiety disorder, unspecified: Secondary | ICD-10-CM | POA: Insufficient documentation

## 2023-08-26 DIAGNOSIS — M19071 Primary osteoarthritis, right ankle and foot: Secondary | ICD-10-CM | POA: Diagnosis present

## 2023-08-26 DIAGNOSIS — M25561 Pain in right knee: Secondary | ICD-10-CM | POA: Insufficient documentation

## 2023-08-26 DIAGNOSIS — M19041 Primary osteoarthritis, right hand: Secondary | ICD-10-CM | POA: Insufficient documentation

## 2023-08-26 DIAGNOSIS — M19042 Primary osteoarthritis, left hand: Secondary | ICD-10-CM | POA: Diagnosis present

## 2023-08-26 DIAGNOSIS — F1290 Cannabis use, unspecified, uncomplicated: Secondary | ICD-10-CM | POA: Insufficient documentation

## 2023-08-26 DIAGNOSIS — M7501 Adhesive capsulitis of right shoulder: Secondary | ICD-10-CM | POA: Insufficient documentation

## 2023-08-26 NOTE — Patient Instructions (Signed)
Hand Exercises Hand exercises can be helpful for almost anyone. They can strengthen your hands and improve flexibility and movement. The exercises can also increase blood flow to the hands. These results can make your work and daily tasks easier for you. Hand exercises can be especially helpful for people who have joint pain from arthritis or nerve damage from using their hands over and over. These exercises can also help people who injure a hand. Exercises Most of these hand exercises are gentle stretching and motion exercises. It is usually safe to do them often throughout the day. Warming up your hands before exercise may help reduce stiffness. You can do this with gentle massage or by placing your hands in warm water for 10-15 minutes. It is normal to feel some stretching, pulling, tightness, or mild discomfort when you begin new exercises. In time, this will improve. Remember to always be careful and stop right away if you feel sudden, very bad pain or your pain gets worse. You want to get better and be safe. Ask your health care provider which exercises are safe for you. Do exercises exactly as told by your provider and adjust them as told. Do not begin these exercises until told by your provider. Knuckle bend or "claw" fist  Stand or sit with your arm, hand, and all five fingers pointed straight up. Make sure to keep your wrist straight. Gently bend your fingers down toward your palm until the tips of your fingers are touching your palm. Keep your big knuckle straight and only bend the small knuckles in your fingers. Hold this position for 10 seconds. Straighten your fingers back to your starting position. Repeat this exercise 5-10 times with each hand. Full finger fist  Stand or sit with your arm, hand, and all five fingers pointed straight up. Make sure to keep your wrist straight. Gently bend your fingers into your palm until the tips of your fingers are touching the middle of your  palm. Hold this position for 10 seconds. Extend your fingers back to your starting position, stretching every joint fully. Repeat this exercise 5-10 times with each hand. Straight fist  Stand or sit with your arm, hand, and all five fingers pointed straight up. Make sure to keep your wrist straight. Gently bend your fingers at the big knuckle, where your fingers meet your hand, and at the middle knuckle. Keep the knuckle at the tips of your fingers straight and try to touch the bottom of your palm. Hold this position for 10 seconds. Extend your fingers back to your starting position, stretching every joint fully. Repeat this exercise 5-10 times with each hand. Tabletop  Stand or sit with your arm, hand, and all five fingers pointed straight up. Make sure to keep your wrist straight. Gently bend your fingers at the big knuckle, where your fingers meet your hand, as far down as you can. Keep the small knuckles in your fingers straight. Think of forming a tabletop with your fingers. Hold this position for 10 seconds. Extend your fingers back to your starting position, stretching every joint fully. Repeat this exercise 5-10 times with each hand. Finger spread  Place your hand flat on a table with your palm facing down. Make sure your wrist stays straight. Spread your fingers and thumb apart from each other as far as you can until you feel a gentle stretch. Hold this position for 10 seconds. Bring your fingers and thumb tight together again. Hold this position for 10 seconds. Repeat  this exercise 5-10 times with each hand. Making circles  Stand or sit with your arm, hand, and all five fingers pointed straight up. Make sure to keep your wrist straight. Make a circle by touching the tip of your thumb to the tip of your index finger. Hold for 10 seconds. Then open your hand wide. Repeat this motion with your thumb and each of your fingers. Repeat this exercise 5-10 times with each hand. Thumb  motion  Sit with your forearm resting on a table and your wrist straight. Your thumb should be facing up toward the ceiling. Keep your fingers relaxed as you move your thumb. Lift your thumb up as high as you can toward the ceiling. Hold for 10 seconds. Bend your thumb across your palm as far as you can, reaching the tip of your thumb for the small finger (pinkie) side of your palm. Hold for 10 seconds. Repeat this exercise 5-10 times with each hand. Grip strengthening  Hold a stress ball or other soft ball in the middle of your hand. Slowly increase the pressure, squeezing the ball as much as you can without causing pain. Think of bringing the tips of your fingers into the middle of your palm. All of your finger joints should bend when doing this exercise. Hold your squeeze for 10 seconds, then relax. Repeat this exercise 5-10 times with each hand. Contact a health care provider if: Your hand pain or discomfort gets much worse when you do an exercise. Your hand pain or discomfort does not improve within 2 hours after you exercise. If you have either of these problems, stop doing these exercises right away. Do not do them again unless your provider says that you can. Get help right away if: You develop sudden, severe hand pain or swelling. If this happens, stop doing these exercises right away. Do not do them again unless your provider says that you can. This information is not intended to replace advice given to you by your health care provider. Make sure you discuss any questions you have with your health care provider. Document Revised: 09/08/2022 Document Reviewed: 09/08/2022 Else[employer] Patient Education  2024 [employer] Exercises for Chronic Knee Pain Chronic knee pain is pain that lasts longer than 3 months. For most people with chronic knee pain, exercise and weight loss is an important part of treatment. Your health care provider may want you to focus on: Making the muscles that  support your knee stronger. This can take pressure off your knee and reduce pain. Preventing knee stiffness. How far you can move your knee, keeping it there or making it farther. Losing weight (if this applies) to take pressure off your knee, lower your risk for injury, and make it easier for you to exercise. Your provider will help you make an exercise program that fits your needs and physical abilities. Below are simple, low-impact exercises you can do at home. Ask your provider or physical therapist how often you should do your exercise program and how many times to repeat each exercise. General safety tips  Get your provider's approval before doing any exercises. Start slowly and stop any time you feel pain. Do not exercise if your knee pain is flaring up. Warm up first. Stretching a cold muscle can cause an injury. Do 5-10 minutes of easy movement or light stretching before beginning your exercises. Do 5-10 minutes of low-impact activity (like walking or cycling) before starting strengthening exercises. Contact your provider any time you have pain during  or after exercising. Exercise can cause discomfort but should not be painful. It is normal to be a little stiff or sore after exercising. Stretching and range-of-motion exercises Front thigh stretch  Stand up straight and support your body by holding on to a chair or resting one hand on a wall. With your legs straight and close together, bend one knee to lift your heel up toward your butt. Using one hand for support, grab your ankle with your free hand. Pull your foot up closer toward your butt to feel the stretch in front of your thigh. Hold the stretch for 30 seconds. Repeat __________ times. Complete this exercise __________ times a day. Back thigh stretch  Sit on the floor with your back straight and your legs out straight in front of you. Place the palms of your hands on the floor and slide them toward your feet as you bend at the  hip. Try to touch your nose to your knees and feel the stretch in the back of your thighs. Hold for 30 seconds. Repeat __________ times. Complete this exercise __________ times a day. Calf stretch  Stand facing a wall. Place the palms of your hands flat against the wall, arms extended, and lean slightly against the wall. Get into a lunge position with one leg bent at the knee and the other leg stretched out straight behind you. Keep both feet facing the wall and increase the bend in your knee while keeping the heel of the other leg flat on the ground. You should feel the stretch in your calf. Hold for 30 seconds. Repeat __________ times. Complete this exercise __________ times a day. Strengthening exercises Straight leg lift  Lie on your back with one knee bent and the other leg out straight. Slowly lift the straight leg without bending the knee. Lift until your foot is about 12 inches (30 cm) off the floor. Hold for 3-5 seconds and slowly lower your leg. Repeat __________ times. Complete this exercise __________ times a day. Single leg dip  Stand between two chairs and put both hands on the backs of the chairs for support. Extend one leg out straight with your body weight resting on the heel of the standing leg. Slowly bend your standing knee to dip your body to the level that is comfortable for you. Hold for 3-5 seconds. Repeat __________ times. Complete this exercise __________ times a day. Hamstring curls  Stand straight, knees close together, facing the back of a chair. Hold on to the back of a chair with both hands. Keep one leg straight. Bend the other knee while bringing the heel up toward the butt until the knee is bent at a 90-degree angle (right angle). Hold for 3-5 seconds. Repeat __________ times. Complete this exercise __________ times a day. Wall squat  Stand straight with your back, hips, and head against a wall. Step forward one foot at a time with your back  still against the wall. Your feet should be 2 feet (61 cm) from the wall at shoulder width. Keeping your back, hips, and head against the wall, slide down the wall to as close to a sitting position as you can get. Hold for 5-10 seconds, then slowly slide back up. Repeat __________ times. Complete this exercise __________ times a day. Step-ups  Stand in front of a sturdy platform or stool that is about 6 inches (15 cm) high. Slowly step up with your left / right foot, keeping your knee in line with your hip  and foot. Do not let your knee bend so far that you cannot see your toes. Hold on to a chair for balance, but do not use it for support. Slowly unlock your knee and lower yourself to the starting position. Repeat __________ times. Complete this exercise __________ times a day. Contact a health care provider if: Your exercises cause pain. Your pain is worse after you exercise. Your pain prevents you from doing your exercises. This information is not intended to replace advice given to you by your health care provider. Make sure you discuss any questions you have with your health care provider. Document Revised: 09/08/2022 Document Reviewed: 09/08/2022 Elsevier Patient Education  2024 [employer] Low Back Sprain or Strain Rehab Ask your health care provider which exercises are safe for you. Do exercises exactly as told by your health care provider and adjust them as directed. It is normal to feel mild stretching, pulling, tightness, or discomfort as you do these exercises. Stop right away if you feel sudden pain or your pain gets worse. Do not begin these exercises until told by your health care provider. Stretching and range-of-motion exercises These exercises warm up your muscles and joints and improve the movement and flexibility of your back. These exercises also help to relieve pain, numbness, and tingling. Lumbar rotation  Lie on your back on a firm bed or the floor with your knees  bent. Straighten your arms out to your sides so each arm forms a 90-degree angle (right angle) with a side of your body. Slowly move (rotate) both of your knees to one side of your body until you feel a stretch in your lower back (lumbar). Try not to let your shoulders lift off the floor. Hold this position for __________ seconds. Tense your abdominal muscles and slowly move your knees back to the starting position. Repeat this exercise on the other side of your body. Repeat __________ times. Complete this exercise __________ times a day. Single knee to chest  Lie on your back on a firm bed or the floor with both legs straight. Bend one of your knees. Use your hands to move your knee up toward your chest until you feel a gentle stretch in your lower back and buttock. Hold your leg in this position by holding on to the front of your knee. Keep your other leg as straight as possible. Hold this position for __________ seconds. Slowly return to the starting position. Repeat with your other leg. Repeat __________ times. Complete this exercise __________ times a day. Prone extension on elbows  Lie on your abdomen on a firm bed or the floor (prone position). Prop yourself up on your elbows. Use your arms to help lift your chest up until you feel a gentle stretch in your abdomen and your lower back. This will place some of your body weight on your elbows. If this is uncomfortable, try stacking pillows under your chest. Your hips should stay down, against the surface that you are lying on. Keep your hip and back muscles relaxed. Hold this position for __________ seconds. Slowly relax your upper body and return to the starting position. Repeat __________ times. Complete this exercise __________ times a day. Strengthening exercises These exercises build strength and endurance in your back. Endurance is the ability to use your muscles for a long time, even after they get tired. Pelvic tilt This  exercise strengthens the muscles that lie deep in the abdomen. Lie on your back on a firm bed or the  floor with your legs extended. Bend your knees so they are pointing toward the ceiling and your feet are flat on the floor. Tighten your lower abdominal muscles to press your lower back against the floor. This motion will tilt your pelvis so your tailbone points up toward the ceiling instead of pointing to your feet or the floor. To help with this exercise, you may place a small towel under your lower back and try to push your back into the towel. Hold this position for __________ seconds. Let your muscles relax completely before you repeat this exercise. Repeat __________ times. Complete this exercise __________ times a day. Alternating arm and leg raises  Get on your hands and knees on a firm surface. If you are on a hard floor, you may want to use padding, such as an exercise mat, to cushion your knees. Line up your arms and legs. Your hands should be directly below your shoulders, and your knees should be directly below your hips. Lift your left leg behind you. At the same time, raise your right arm and straighten it in front of you. Do not lift your leg higher than your hip. Do not lift your arm higher than your shoulder. Keep your abdominal and back muscles tight. Keep your hips facing the ground. Do not arch your back. Keep your balance carefully, and do not hold your breath. Hold this position for __________ seconds. Slowly return to the starting position. Repeat with your right leg and your left arm. Repeat __________ times. Complete this exercise __________ times a day. Abdominal set with straight leg raise  Lie on your back on a firm bed or the floor. Bend one of your knees and keep your other leg straight. Tense your abdominal muscles and lift your straight leg up, 4-6 inches (10-15 cm) off the ground. Keep your abdominal muscles tight and hold this position for __________  seconds. Do not hold your breath. Do not arch your back. Keep it flat against the ground. Keep your abdominal muscles tense as you slowly lower your leg back to the starting position. Repeat with your other leg. Repeat __________ times. Complete this exercise __________ times a day. Single leg lower with bent knees Lie on your back on a firm bed or the floor. Tense your abdominal muscles and lift your feet off the floor, one foot at a time, so your knees and hips are bent in 90-degree angles (right angles). Your knees should be over your hips and your lower legs should be parallel to the floor. Keeping your abdominal muscles tense and your knee bent, slowly lower one of your legs so your toe touches the ground. Lift your leg back up to return to the starting position. Do not hold your breath. Do not let your back arch. Keep your back flat against the ground. Repeat with your other leg. Repeat __________ times. Complete this exercise __________ times a day. Posture and body mechanics Good posture and healthy body mechanics can help to relieve stress in your body's tissues and joints. Body mechanics refers to the movements and positions of your body while you do your daily activities. Posture is part of body mechanics. Good posture means: Your spine is in its natural S-curve position (neutral). Your shoulders are pulled back slightly. Your head is not tipped forward (neutral). Follow these guidelines to improve your posture and body mechanics in your everyday activities. Standing  When standing, keep your spine neutral and your feet about hip-width apart. Keep a  slight bend in your knees. Your ears, shoulders, and hips should line up. When you do a task in which you stand in one place for a long time, place one foot up on a stable object that is 2-4 inches (5-10 cm) high, such as a footstool. This helps keep your spine neutral. Sitting  When sitting, keep your spine neutral and keep your  feet flat on the floor. Use a footrest, if necessary, and keep your thighs parallel to the floor. Avoid rounding your shoulders, and avoid tilting your head forward. When working at a desk or a computer, keep your desk at a height where your hands are slightly lower than your elbows. Slide your chair under your desk so you are close enough to maintain good posture. When working at a computer, place your monitor at a height where you are looking straight ahead and you do not have to tilt your head forward or downward to look at the screen. Resting When lying down and resting, avoid positions that are most painful for you. If you have pain with activities such as sitting, bending, stooping, or squatting, lie in a position in which your body does not bend very much. For example, avoid curling up on your side with your arms and knees near your chest (fetal position). If you have pain with activities such as standing for a long time or reaching with your arms, lie with your spine in a neutral position and bend your knees slightly. Try the following positions: Lying on your side with a pillow between your knees. Lying on your back with a pillow under your knees. Lifting  When lifting objects, keep your feet at least shoulder-width apart and tighten your abdominal muscles. Bend your knees and hips and keep your spine neutral. It is important to lift using the strength of your legs, not your back. Do not lock your knees straight out. Always ask for help to lift heavy or awkward objects. This information is not intended to replace advice given to you by your health care provider. Make sure you discuss any questions you have with your health care provider. Document Revised: 12/28/2022 Document Reviewed: 11/11/2020 Elsevier Patient Education  2024 [employer].

## 2023-08-27 ENCOUNTER — Other Ambulatory Visit (HOSPITAL_BASED_OUTPATIENT_CLINIC_OR_DEPARTMENT_OTHER): Payer: Self-pay

## 2023-08-30 ENCOUNTER — Other Ambulatory Visit (HOSPITAL_BASED_OUTPATIENT_CLINIC_OR_DEPARTMENT_OTHER): Payer: Self-pay

## 2023-08-31 ENCOUNTER — Other Ambulatory Visit (HOSPITAL_BASED_OUTPATIENT_CLINIC_OR_DEPARTMENT_OTHER): Payer: Self-pay

## 2023-09-02 ENCOUNTER — Other Ambulatory Visit: Payer: Self-pay | Admitting: Medical

## 2023-09-02 ENCOUNTER — Ambulatory Visit: Payer: Self-pay | Admitting: Podiatry

## 2023-09-06 ENCOUNTER — Telehealth: Payer: Self-pay

## 2023-09-06 ENCOUNTER — Other Ambulatory Visit (HOSPITAL_BASED_OUTPATIENT_CLINIC_OR_DEPARTMENT_OTHER): Payer: Self-pay

## 2023-09-06 ENCOUNTER — Ambulatory Visit (INDEPENDENT_AMBULATORY_CARE_PROVIDER_SITE_OTHER): Payer: Medicaid Other | Admitting: Medical

## 2023-09-06 ENCOUNTER — Encounter: Payer: Self-pay | Admitting: Medical

## 2023-09-06 VITALS — BP 130/80 | HR 65 | Resp 18 | Ht <= 58 in | Wt 183.0 lb

## 2023-09-06 DIAGNOSIS — I10 Essential (primary) hypertension: Secondary | ICD-10-CM | POA: Diagnosis not present

## 2023-09-06 DIAGNOSIS — F329 Major depressive disorder, single episode, unspecified: Secondary | ICD-10-CM | POA: Diagnosis not present

## 2023-09-06 DIAGNOSIS — M797 Fibromyalgia: Secondary | ICD-10-CM | POA: Diagnosis not present

## 2023-09-06 DIAGNOSIS — F419 Anxiety disorder, unspecified: Secondary | ICD-10-CM | POA: Diagnosis not present

## 2023-09-06 MED ORDER — BUSPIRONE HCL 7.5 MG PO TABS: 7.5000 mg | ORAL_TABLET | Freq: Two times a day (BID) | ORAL | 0 refills | Status: DC

## 2023-09-06 NOTE — Progress Notes (Signed)
   Subjective:    Patient ID: Phyllis Adams, female    DOB: 1976/09/02, 47 y.o.   MRN: [id number]  HPI Discussed the use of AI scribe software for clinical note transcription with the patient, who gave verbal consent to proceed.  History of Present Illness   The patient, with a history of fibromyalgia, presents with ongoing generalized body pain. She reports that the pain is becoming increasingly unbearable. Previously, she was prescribed Cymbalta, but it was discontinued due to significant nausea and loss of appetite. The patient also reports a failed trial of Tramadol due to an allergic reaction. She has been prescribed Savella, but has been unable to obtain it due to insurance issues.  The patient's pain is compounded by significant personal stress. She reports a recent family crisis involving a grandchild's severe illness, which has led to sleep deprivation and increased anxiety. Prior to this event, the patient had been experiencing symptoms of depression, which have since worsened.  The patient has a history of sciatic nerve pain, for which she was previously treated with Lyrica. She reports that she may have stopped in past when she started epidural injections. But remembers no allergic reaction to lyrica.  She also has a history of hypertension, currently managed with Losartan 25mg  and Amlodipine 10mg  daily.        Review of Systems See hpi    Objective:   Physical Exam  General Mental Status- Alert. General Appearance- Not in acute distress.    Skin General: Color- Normal Color. Moisture- Normal Moisture.   Neck Carotid Arteries- Normal color. Moisture- Normal Moisture. No carotid bruits. No JVD.   Chest and Lung Exam Auscultation: Breath Sounds:-Normal.   Cardiovascular Auscultation:Rythm- Regular. Murmurs & Other Heart Sounds:Auscultation of the heart reveals- No Murmurs.   Abdomen Inspection:-Inspeection Normal. Palpation/Percussion:Note:No mass. Palpation and  Percussion of the abdomen reveal- Non Tender, Non Distended + BS, no rebound or guarding.     Neurologic Cranial Nerve exam:- CN III-XII intact(No nystagmus), symmetric smile. Strength:- 5/5 equal and symmetric strength both upper and lower extremities.    Back- symmetric area of tendernss running length of back on palpation. No mid spinal pain.      Assessment & Plan:   Fibromyalgia Persistent generalized pain. Cymbalta was discontinued due to nausea. Savella prescribed but pending insurance prior authorization. Previous trial of Lyrica with unclear response for sciatica back in 2017-2018. But no side effects. -Check status of Savella prior authorization. -If Savella not approved, consider restarting Lyrica at a low dose.   Hypertension Blood pressure initially elevated but improved on repeat measurement. Currently on Losartan 25mg  and Amlodipine 10mg  daily. -Continue current antihypertensive regimen.  Depression/Anxiety Elevated GAD-7 and PHQ-9 scores, exacerbated by recent family stressors. -Start Buspar 7.5mg  twice daily. -If Savella approved, monitor for potential mood improvement. -If Savella not approved and Lyrica restarted, consider adding a separate antidepressant.  Follow-up in 10-14 days to assess response to new medication(s). Once new fibromyalgia med started.  Esperanza Richters, PA-C

## 2023-09-06 NOTE — Patient Instructions (Signed)
Fibromyalgia Persistent generalized pain. Cymbalta was discontinued due to nausea. Savella prescribed but pending insurance prior authorization. Previous trial of Lyrica with unclear response for sciatica back in 2017-2018. But no side effects. -Check status of Savella prior authorization. -If Savella not approved, consider restarting Lyrica at a low dose.   Hypertension Blood pressure initially elevated but improved on repeat measurement. Currently on Losartan 25mg  and Amlodipine 10mg  daily. -Continue current antihypertensive regimen.  Depression/Anxiety Elevated GAD-7 and PHQ-9 scores, exacerbated by recent family stressors. -Start Buspar 7.5mg  twice daily. -If Savella approved, monitor for potential mood improvement. -If Savella not approved and Lyrica restarted, consider adding a separate antidepressant.  Follow-up in 10-14 days to assess response to new medication(s). Once new fibromyalgia med started.

## 2023-09-06 NOTE — Telephone Encounter (Signed)
Could one you start a PA for this patient , the medication is   Milnacipran HCl (SAVELLA TITRATION PACK) 12.5 & 25 & 50 MG MISC     [facility] said it was archived 17 days ago on [employer] but [name] is requesting PA

## 2023-09-06 NOTE — Telephone Encounter (Signed)
Phyllis Adams has to be requested via telephone call w/ medicaid, will forward to PA team.

## 2023-09-07 ENCOUNTER — Other Ambulatory Visit (HOSPITAL_COMMUNITY): Payer: Self-pay

## 2023-09-09 ENCOUNTER — Encounter: Payer: Self-pay | Admitting: Medical

## 2023-09-14 ENCOUNTER — Ambulatory Visit (HOSPITAL_BASED_OUTPATIENT_CLINIC_OR_DEPARTMENT_OTHER): Payer: Medicaid Other

## 2023-09-15 ENCOUNTER — Other Ambulatory Visit (HOSPITAL_COMMUNITY): Payer: Self-pay

## 2023-09-16 ENCOUNTER — Telehealth (HOSPITAL_COMMUNITY): Payer: Self-pay | Admitting: *Deleted

## 2023-09-16 NOTE — Telephone Encounter (Signed)
 Patient given detailed instructions per Myocardial Perfusion Study Information Sheet for the test on 09/20/2023 at 10:30. Patient notified to arrive 15 minutes early and that it is imperative to arrive on time for appointment to keep from having the test rescheduled.  If you need to cancel or reschedule your appointment, please call the office within 24 hours of your appointment. . Patient verbalized understanding.Phyllis Adams

## 2023-09-20 ENCOUNTER — Ambulatory Visit (HOSPITAL_COMMUNITY): Payer: Medicaid Other | Attending: Cardiology

## 2023-09-20 DIAGNOSIS — R072 Precordial pain: Secondary | ICD-10-CM | POA: Diagnosis not present

## 2023-09-20 DIAGNOSIS — I259 Chronic ischemic heart disease, unspecified: Secondary | ICD-10-CM | POA: Diagnosis not present

## 2023-09-20 LAB — MYOCARDIAL PERFUSION IMAGING
Angina Index: 0
Duke Treadmill Score: 8
Estimated workload: 10.1
Exercise duration (min): 7 min
Exercise duration (sec): 30 s
LV dias vol: 75 mL (ref 46–106)
LV sys vol: 30 mL
MPHR: 172 {beats}/min
Nuc Stress EF: 60 %
Peak HR: 157 {beats}/min
Percent HR: 91 %
Rest HR: 71 {beats}/min
Rest Nuclear Isotope Dose: 10.3 mCi
SDS: 0
SRS: 0
SSS: 0
ST Depression (mm): 0 mm
Stress Nuclear Isotope Dose: 31.4 mCi
TID: 1.04

## 2023-09-20 MED ORDER — REGADENOSON 0.4 MG/5ML IV SOLN: 0.4000 mg | Freq: Once | INTRAVENOUS | Status: AC

## 2023-09-20 MED ORDER — TECHNETIUM TC 99M TETROFOSMIN IV KIT
31.4000 | PACK | Freq: Once | INTRAVENOUS | Status: AC | PRN
  Administered 2023-09-20: 31.4 via INTRAVENOUS

## 2023-09-20 MED ORDER — TECHNETIUM TC 99M TETROFOSMIN IV KIT
10.3000 | PACK | Freq: Once | INTRAVENOUS | Status: AC | PRN
  Administered 2023-09-20: 10.3 via INTRAVENOUS

## 2023-09-22 ENCOUNTER — Telehealth: Payer: Self-pay

## 2023-09-22 ENCOUNTER — Other Ambulatory Visit: Payer: Self-pay | Admitting: Family

## 2023-09-22 ENCOUNTER — Other Ambulatory Visit (HOSPITAL_COMMUNITY): Payer: Self-pay

## 2023-09-22 MED ORDER — SAVELLA TITRATION PACK 12.5 & 25 & 50 MG PO MISC: 12.5000 mg | Freq: Two times a day (BID) | ORAL | 0 refills | Status: DC

## 2023-09-22 NOTE — Telephone Encounter (Signed)
 Pharmacy Patient Advocate Encounter   Received notification from Pt Calls Messages that prior authorization for Savella  (starter pack was originally requested, but not an option via [employer], submitted for 55 tablets of 12.5 mg for pt to titrate up to 50mg ) is required/requested.   Insurance verification completed.   The patient is insured through [facility] MEDICAID .   Per test claim: PA required; PA submitted to above mentioned insurance via [facility] Tracks Key/confirmation #/EOC [id number] W Status is pending

## 2023-09-22 NOTE — Telephone Encounter (Signed)
 Submitted to [employer], Savella  12.5 mg starter pack was an option, but only had a quantity of 5 tablets, I submitted a request for the original quantity of 55 tablets of the 12.5 mg strength, hope this works. If not, will try to resubmit for starter pack quantity and separate PA for other strengths.  PA request has been Submitted. New Encounter created for follow up. For additional info see Pharmacy Prior Auth telephone encounter from 09/22/2023.

## 2023-09-22 NOTE — Telephone Encounter (Signed)
 PA has been approved, looks like original script may have been canceled, please have Mr. Phyllis Adams resend rx with existing instructions, but quantity of 55 tablets of 12.5 mg Savella , and med should be $4.00 for pt, thank you!

## 2023-09-22 NOTE — Telephone Encounter (Signed)
 Pharmacy Patient Advocate Encounter  Received notification from [place] MEDICAID that Prior Authorization for Savella  has been APPROVED from 09/22/2023 to 10/22/2023. Ran test claim, Copay is $4.00. This test claim was processed through [facility]- copay amounts may vary at other pharmacies due to pharmacy/plan contracts, or as the patient moves through the different stages of their insurance plan.   PA #/Case ID/Reference #: [id number]

## 2023-10-05 ENCOUNTER — Ambulatory Visit (HOSPITAL_BASED_OUTPATIENT_CLINIC_OR_DEPARTMENT_OTHER)
Admission: RE | Admit: 2023-10-05 | Discharge: 2023-10-05 | Disposition: A | Discharge status: Home / Self Care | Payer: Medicaid Other | Source: Ambulatory Visit | Attending: Cardiology | Admitting: Cardiology

## 2023-10-05 DIAGNOSIS — R079 Chest pain, unspecified: Secondary | ICD-10-CM

## 2023-10-05 DIAGNOSIS — I259 Chronic ischemic heart disease, unspecified: Secondary | ICD-10-CM | POA: Diagnosis present

## 2023-10-05 LAB — ECHOCARDIOGRAM COMPLETE
AR max vel: 2.58 cm2
AV Area VTI: 2.6 cm2
AV Area mean vel: 2.58 cm2
AV Mean grad: 4 mm[Hg]
AV Peak grad: 7.2 mm[Hg]
Ao pk vel: 1.34 m/s
Area-P 1/2: 5.75 cm2
Calc EF: 70.9 %
MV M vel: 2.05 m/s
MV Peak grad: 16.8 mm[Hg]
S' Lateral: 2.5 cm
Single Plane A2C EF: 70.1 %
Single Plane A4C EF: 71.6 %

## 2023-10-15 ENCOUNTER — Ambulatory Visit (INDEPENDENT_AMBULATORY_CARE_PROVIDER_SITE_OTHER): Payer: Medicaid Other | Admitting: Podiatry

## 2023-10-15 ENCOUNTER — Encounter: Payer: Self-pay | Admitting: Podiatry

## 2023-10-15 DIAGNOSIS — M722 Plantar fascial fibromatosis: Secondary | ICD-10-CM

## 2023-10-15 MED ORDER — TRIAMCINOLONE ACETONIDE 10 MG/ML IJ SUSP: 2.5000 mg | Freq: Once | INTRAMUSCULAR | Status: AC

## 2023-10-15 MED ORDER — MELOXICAM 15 MG PO TABS: 15.0000 mg | ORAL_TABLET | Freq: Every day | ORAL | 0 refills | Status: DC

## 2023-10-15 MED ORDER — DEXAMETHASONE SODIUM PHOSPHATE 120 MG/30ML IJ SOLN: 4.0000 mg | Freq: Once | INTRAMUSCULAR | Status: AC

## 2023-10-15 NOTE — Progress Notes (Signed)
  Subjective:  Patient ID: Phyllis Adams, female    DOB: 1976-04-23,   MRN: [id number]  No chief complaint on file.   48 y.o. female presents for concern of bilateral foot pain. Relates she had been dealing with pain for a while. She has been seen in rheumatology and has been worked up for possible autoimmune problem but was told she has arthritis of the foot. 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  Has taken anti-inflammatories in the past but was giving her self a break to reset her body.  Denies any other pedal complaints. Denies n/v/f/c.   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   Primary osteoarthritis of both feet 08/12/2023   Primary osteoarthritis of both hands 08/12/2023   Clinical and radiographic findings were suggestive of osteoarthritis.  No synovitis was noted.     Retained orthopedic hardware 01/16/2016   Right leg pain 07/16/2016   Segmental and somatic dysfunction of 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   Spasm of muscle 04/15/2016    Objective:  Physical Exam: Vascular: DP/PT pulses 2/4 bilateral. CFT <3 seconds. Normal hair growth on digits. No edema.  Skin. No lacerations or abrasions bilateral feet.  Musculoskeletal: MMT 5/5 bilateral lower extremities in DF, PF, Inversion and Eversion. Deceased ROM in DF of ankle joint. Tender to the medial calcaneal tubercle bilaterally but worse on left  . No pain with achilles, PT or arch. No pain with calcaneal squeeze. Does have mild tenderness elsewhere but not as consistent Neurological:  Sensation intact to light touch.   Assessment:   1. Plantar fasciitis of left foot      Plan:  Patient was evaluated and treated and all questions answered. Discussed plantar fasciitis with patient. Discussed radiculopathy and firbomyalgia as well that can be compounding the issue X-rays reviewed and discussed with patient. No acute fractures or dislocations noted. Mild spurring noted at inferior calcaneus.  Discussed treatment options including, ice, NSAIDS, supportive shoes, bracing, and stretching. Stretching exercises provided to be done on a daily basis.   Prescription for meloxicam  provided and sent to pharmacy. Patient requesting injection today. Procedure note below.   PF brace dispensed.  Follow-up 6 weeks or sooner if any problems arise. In the meantime, encouraged to call the office with any questions, concerns, change in symptoms.   Procedure:  Discussed etiology, pathology, conservative vs. surgical therapies. At this time a plantar fascial injection was recommended.  The patient agreed and a sterile skin prep was applied.  An injection consisting of  1cc dexamethasone  0.5 cc kenalog  and 1cc marcaine mixture was infiltrated at the point of maximal tenderness on the left Heel.  Bandaid applied. The patient tolerated this well and was given instructions for aftercare.    Asberry Failing, DPM

## 2023-10-15 NOTE — Patient Instructions (Signed)

## 2023-10-19 ENCOUNTER — Ambulatory Visit: Payer: Medicaid Other | Admitting: Medical

## 2023-10-19 ENCOUNTER — Encounter: Payer: Self-pay | Admitting: Medical

## 2023-10-19 ENCOUNTER — Encounter: Payer: Self-pay | Admitting: Podiatry

## 2023-10-19 VITALS — BP 126/80 | HR 66 | Ht <= 58 in | Wt 176.8 lb

## 2023-10-19 DIAGNOSIS — M722 Plantar fascial fibromatosis: Secondary | ICD-10-CM

## 2023-10-19 DIAGNOSIS — E162 Hypoglycemia, unspecified: Secondary | ICD-10-CM

## 2023-10-19 DIAGNOSIS — R5383 Other fatigue: Secondary | ICD-10-CM

## 2023-10-19 DIAGNOSIS — R232 Flushing: Secondary | ICD-10-CM | POA: Diagnosis not present

## 2023-10-19 DIAGNOSIS — R6889 Other general symptoms and signs: Secondary | ICD-10-CM | POA: Diagnosis not present

## 2023-10-19 DIAGNOSIS — M2550 Pain in unspecified joint: Secondary | ICD-10-CM

## 2023-10-19 DIAGNOSIS — M5416 Radiculopathy, lumbar region: Secondary | ICD-10-CM

## 2023-10-19 LAB — T4, FREE: Free T4: 0.78 ng/dL (ref 0.60–1.60)

## 2023-10-19 LAB — COMPREHENSIVE METABOLIC PANEL
ALT: 6 U/L (ref 0–35)
AST: 12 U/L (ref 0–37)
Albumin: 4.3 g/dL (ref 3.5–5.2)
Alkaline Phosphatase: 79 U/L (ref 39–117)
BUN: 10 mg/dL (ref 6–23)
CO2: 30 meq/L (ref 19–32)
Calcium: 9.5 mg/dL (ref 8.4–10.5)
Chloride: 105 meq/L (ref 96–112)
Creatinine, Ser: 0.65 mg/dL (ref 0.40–1.20)
GFR: 104.09 mL/min (ref >60.00)
Glucose, Bld: 78 mg/dL (ref 70–99)
Potassium: 3.9 meq/L (ref 3.5–5.1)
Sodium: 140 meq/L (ref 135–145)
Total Bilirubin: 0.3 mg/dL (ref 0.2–1.2)
Total Protein: 7.2 g/dL (ref 6.0–8.3)

## 2023-10-19 LAB — SEDIMENTATION RATE: Sed Rate: 48 mm/h — ABNORMAL HIGH (ref 0–20)

## 2023-10-19 LAB — CBC WITH DIFFERENTIAL/PLATELET
Basophils Absolute: 0 10*3/uL (ref 0.0–0.1)
Basophils Relative: 0.4 % (ref 0.0–3.0)
Eosinophils Absolute: 0.1 10*3/uL (ref 0.0–0.7)
Eosinophils Relative: 0.9 % (ref 0.0–5.0)
HCT: 41.3 % (ref 36.0–46.0)
Hemoglobin: 13.3 g/dL (ref 12.0–15.0)
Lymphocytes Relative: 43 % (ref 12.0–46.0)
Lymphs Abs: 3.2 10*3/uL (ref 0.7–4.0)
MCHC: 32.2 g/dL (ref 30.0–36.0)
MCV: 82.8 fL (ref 78.0–100.0)
Monocytes Absolute: 0.4 10*3/uL (ref 0.1–1.0)
Monocytes Relative: 5.8 % (ref 3.0–12.0)
Neutro Abs: 3.7 10*3/uL (ref 1.4–7.7)
Neutrophils Relative %: 49.9 % (ref 43.0–77.0)
Platelets: 266 10*3/uL (ref 150.0–400.0)
RBC: 4.99 Mil/uL (ref 3.87–5.11)
RDW: 14.5 % (ref 11.5–15.5)
WBC: 7.4 10*3/uL (ref 4.0–10.5)

## 2023-10-19 LAB — TSH: TSH: 1.32 u[IU]/mL (ref 0.35–5.50)

## 2023-10-19 LAB — VITAMIN B12: Vitamin B-12: 213 pg/mL (ref 211–911)

## 2023-10-19 LAB — C-REACTIVE PROTEIN: CRP: <1 mg/dL (ref 0.5–20.0)

## 2023-10-19 LAB — FOLLICLE STIMULATING HORMONE: FSH: 60.5 m[IU]/mL

## 2023-10-19 NOTE — Patient Instructions (Addendum)
Plantar Fasciitis Bilateral foot pain, more severe in the left foot. Steroid injection provided temporary relief. -Continue with podiatrist's plan of care.  Fibromyalgia Persistent body aches but improved some. Currently using cymbalta with tolerable nausea. Savella not to be filled since you clarified on cymbalta and not lyrica. -Check current Lyrica dosage at home and report back. -Inquire about Savella prescription status at pharmacy.  Depression Improvement noted following the loss of a grandchild. -no antidepressant needed.[name]  Possible Lupus and some fatigue reported Previous elevated ANA and rheumatoid factor. No definitive diagnosis from rheumatologists. -Repeat ANA, rheumatoid factor, CBC, B12, TSH, T4, and metabolic panel. -Share results with rheumatologist for further evaluation.  Possible Hypoglycemia Previous episodes of low blood sugar. Recent episode of nausea, sweating, and weakness. -Consider purchasing a glucometer for home use. -Check blood sugar during symptomatic episodes.  Menopause Previous elevated FSH level. Recent episode of heat intolerance and sweating. -Repeat FSH level to confirm menopausal status.  Follow up date to be determined.

## 2023-10-19 NOTE — Progress Notes (Signed)
Subjective:    Patient ID: Phyllis Adams, female    DOB: 25-Dec-1975, 48 y.o.   MRN: [id number]  HPI Discussed the use of AI scribe software for clinical note transcription with the patient, who gave verbal consent to proceed.  History of Present Illness   Phyllis Adams is a 48 year old female with plantar fasciitis and fibromyalgia who presents with foot pain and body aches.  She experiences ongoing foot pain, primarily in the left foot, diagnosed as plantar fasciitis. A steroid injection and a brace initially alleviated the pain, but it has since returned, with the left foot being more severe. X-rays reviewed by the podiatrist did not show arthritis, contrary to a previous assessment by other specialist.  She continues to experience body aches and suspects fibromyalgia. She uses cymbalta despite experiencing nausea as a side effect. She has not received a new prescription for Savella, which was intended to be part of her treatment plan. She is concerned about her ANA levels, which were previously elevated, and seeks further lab work to assess inflammation.  She reports occasional low blood sugar levels, with two instances of readings below 70. She experiences symptoms of nausea, sweating, and weakness, which may be related to hypoglycemia.  She describes episodes of nausea, sweating, and lightheadedness while sitting in the sun, which have occurred twice. She questions whether this could be related to overheating, as she was not emotionally distressed at the time.  Her menopausal symptoms, including hot flashes, had subsided for several months but have recently recurred. She experienced sweating and lightheadedness during the recent episode in the sun, but she has not had a menstrual cycle for an extended period.  Her depression, which was reactive to the loss of her grandchild, has improved significantly, with a current depression score of two. No anxiety.        Review of Systems See  hpi.    Objective:   Physical Exam  General Mental Status- Alert. General Appearance- Not in acute distress.   Skin General: Color- Normal Color. Moisture- Normal Moisture.  Neck  No carotid bruits. No JVD.  Chest and Lung Exam Auscultation: Breath Sounds:-Normal.  Cardiovascular Auscultation:Rythm- Regular. Murmurs & Other Heart Sounds:Auscultation of the heart reveals- No Murmurs.  Abdomen Inspection:-Inspeection Normal. Palpation/Percussion:Note:No mass. Palpation and Percussion of the abdomen reveal- Non Tender, Non Distended + BS, no rebound or guarding.   Neurologic Cranial Nerve exam:- CN III-XII intact(No nystagmus), symmetric smile. Strength:- 5/5 equal and symmetric strength both upper and lower extremities.       Assessment & Plan:   Plantar Fasciitis Bilateral foot pain, more severe in the left foot. Steroid injection provided temporary relief. -Continue with podiatrist's plan of care.  Fibromyalgia Persistent body aches but improved some. Currently using cymbalta with tolerable nausea. Savella not to be filled since you clarified on cymbalta and not lyrica. -Check current Lyrica dosage at home and report back. -Inquire about Savella prescription status at pharmacy.  Depression Improvement noted following the loss of a grandchild. -no antidepressant needed.[name]  Possible Lupus and some fatigue reported Previous elevated ANA and rheumatoid factor. No definitive diagnosis from rheumatologists. -Repeat ANA, rheumatoid factor, CBC, B12, TSH, T4, and metabolic panel. -Share results with rheumatologist for further evaluation.  Possible Hypoglycemia Previous episodes of low blood sugar. Recent episode of nausea, sweating, and weakness. -Consider purchasing a glucometer for home use. -Check blood sugar during symptomatic episodes.  Menopause Previous elevated FSH level. Recent episode of heat intolerance and sweating. -Repeat FSH  level to confirm  menopausal status.  Follow up date to be determined.  Time spent with patient today was  40 minutes which consisted of chart revdiew, discussing diagnosis, work up treatment and documentation.

## 2023-10-20 ENCOUNTER — Encounter: Payer: Self-pay | Admitting: Medical

## 2023-10-20 LAB — RHEUMATOID FACTOR: Rheumatoid fact SerPl-aCnc: <10 [IU]/mL (ref <14)

## 2023-10-20 LAB — ANA: Anti Nuclear Antibody (ANA): NEGATIVE

## 2023-10-21 ENCOUNTER — Encounter: Payer: Self-pay | Admitting: Medical

## 2023-10-21 NOTE — Progress Notes (Signed)
I reviewed the labs forwarded by you.  ANA was always very low titer and not significant which is negative now.  She had no synovitis on the examination.  She has underlying osteoarthritis which will cause discomfort.  Sed rate is probably high due to high BMI.  Thank you for forwarding labs to me.

## 2023-10-23 NOTE — Addendum Note (Signed)
 Addended by: Gwenevere Abbot on: 10/23/2023 09:08 AM   Modules accepted: Orders

## 2023-10-25 ENCOUNTER — Telehealth: Payer: Self-pay | Admitting: Medical

## 2023-10-25 NOTE — Telephone Encounter (Signed)
 Copied from CRM [id number]. Topic: Appointments - Scheduling Inquiry for Clinic >> Oct 25, 2023 10:37 AM Fredrich Romans wrote: Reason for CRM: patients wife is scheduled to come in tomorrow 10/26/2023 at 5pm for her b12 with nurse.Patient would like to know if she could be placed on schedule to come with her wife to receive her b12 as well? Nurse schedule is full already for 10/26/2023.

## 2023-10-25 NOTE — Telephone Encounter (Signed)
 Pt scheduled and notified

## 2023-10-26 ENCOUNTER — Ambulatory Visit (INDEPENDENT_AMBULATORY_CARE_PROVIDER_SITE_OTHER): Payer: Medicaid Other

## 2023-10-26 DIAGNOSIS — E538 Deficiency of other specified B group vitamins: Secondary | ICD-10-CM

## 2023-10-26 MED ORDER — CYANOCOBALAMIN 1000 MCG/ML IJ SOLN
1000.0000 ug | Freq: Once | INTRAMUSCULAR | Status: AC
  Administered 2023-10-26: 1000 ug via INTRAMUSCULAR

## 2023-10-27 NOTE — Progress Notes (Signed)
 Pt here for first weekly B12 injection per Marisue Brooklyn  B12 given IM, and pt tolerated injection well.  Next B12 injection scheduled for next week

## 2023-10-29 ENCOUNTER — Other Ambulatory Visit: Payer: Self-pay | Admitting: Medical

## 2023-10-29 DIAGNOSIS — M5416 Radiculopathy, lumbar region: Secondary | ICD-10-CM

## 2023-11-02 ENCOUNTER — Ambulatory Visit (INDEPENDENT_AMBULATORY_CARE_PROVIDER_SITE_OTHER): Payer: Medicaid Other

## 2023-11-02 DIAGNOSIS — E538 Deficiency of other specified B group vitamins: Secondary | ICD-10-CM | POA: Diagnosis not present

## 2023-11-02 MED ORDER — CYANOCOBALAMIN 1000 MCG/ML IJ SOLN
1000.0000 ug | Freq: Once | INTRAMUSCULAR | Status: AC
  Administered 2023-11-02: 1000 ug via INTRAMUSCULAR

## 2023-11-02 MED ORDER — CYANOCOBALAMIN 1000 MCG/ML IJ SOLN: 1000.0000 ug | Freq: Once | INTRAMUSCULAR | 0 refills | Status: DC

## 2023-11-02 NOTE — Progress Notes (Signed)
 Pt here for weekly B12 injection per Pryor Curia  B12 given IM, and pt tolerated injection well.  Next B12 injection scheduled for next week

## 2023-11-05 NOTE — Discharge Instructions (Signed)

## 2023-11-08 ENCOUNTER — Ambulatory Visit
Admission: RE | Admit: 2023-11-08 | Discharge: 2023-11-08 | Disposition: A | Discharge status: Home / Self Care | Payer: Medicaid Other | Source: Ambulatory Visit | Attending: Medical | Admitting: Medical

## 2023-11-08 DIAGNOSIS — M5416 Radiculopathy, lumbar region: Secondary | ICD-10-CM

## 2023-11-08 MED ORDER — METHYLPREDNISOLONE ACETATE 40 MG/ML INJ SUSP (RADIOLOG
80.0000 mg | Freq: Once | INTRAMUSCULAR | Status: AC
  Administered 2023-11-08: 80 mg via EPIDURAL

## 2023-11-08 MED ORDER — IOPAMIDOL (ISOVUE-M 200) INJECTION 41%
1.0000 mL | Freq: Once | INTRAMUSCULAR | Status: AC
Start: 2023-11-08 — End: 2023-11-08
  Administered 2023-11-08: 1 mL via EPIDURAL

## 2023-11-09 ENCOUNTER — Ambulatory Visit (INDEPENDENT_AMBULATORY_CARE_PROVIDER_SITE_OTHER): Payer: Medicaid Other

## 2023-11-09 DIAGNOSIS — E538 Deficiency of other specified B group vitamins: Secondary | ICD-10-CM | POA: Diagnosis not present

## 2023-11-09 MED ORDER — CYANOCOBALAMIN 1000 MCG/ML IJ SOLN
1000.0000 ug | Freq: Once | INTRAMUSCULAR | Status: AC
Start: 2023-11-09 — End: 2023-11-09
  Administered 2023-11-09: 1000 ug via INTRAMUSCULAR

## 2023-11-09 NOTE — Progress Notes (Signed)
Pt here for weekly B12 injection per Edward [name],PA-C  B12 1000mcg given IM, and pt tolerated injection well.  Next B12 injection scheduled for next week 

## 2023-11-16 ENCOUNTER — Ambulatory Visit (INDEPENDENT_AMBULATORY_CARE_PROVIDER_SITE_OTHER)

## 2023-11-16 DIAGNOSIS — E538 Deficiency of other specified B group vitamins: Secondary | ICD-10-CM

## 2023-11-16 MED ORDER — CYANOCOBALAMIN 1000 MCG/ML IJ SOLN
1000.0000 ug | Freq: Once | INTRAMUSCULAR | Status: AC
  Administered 2023-11-16: 1000 ug via INTRAMUSCULAR

## 2023-11-16 NOTE — Progress Notes (Signed)
 Pt here for weekly B12 injection per Marisue Brooklyn  B12 given IM, and pt tolerated injection well.  Next B12 injection scheduled for monthly

## 2023-11-26 ENCOUNTER — Ambulatory Visit: Payer: Medicaid Other | Admitting: Podiatry

## 2023-12-15 ENCOUNTER — Other Ambulatory Visit: Payer: Self-pay | Admitting: Medical

## 2023-12-16 ENCOUNTER — Ambulatory Visit: Admitting: Medical

## 2023-12-16 ENCOUNTER — Encounter: Payer: Self-pay | Admitting: Medical

## 2023-12-16 VITALS — BP 156/78 | HR 72 | Temp 98.2°F | Resp 18 | Ht <= 58 in | Wt 179.0 lb

## 2023-12-16 DIAGNOSIS — I10 Essential (primary) hypertension: Secondary | ICD-10-CM | POA: Diagnosis not present

## 2023-12-16 DIAGNOSIS — E538 Deficiency of other specified B group vitamins: Secondary | ICD-10-CM | POA: Diagnosis not present

## 2023-12-16 DIAGNOSIS — M5416 Radiculopathy, lumbar region: Secondary | ICD-10-CM

## 2023-12-16 DIAGNOSIS — M2550 Pain in unspecified joint: Secondary | ICD-10-CM

## 2023-12-16 LAB — COMPREHENSIVE METABOLIC PANEL WITH GFR
ALT: 10 U/L (ref 0–35)
AST: 17 U/L (ref 0–37)
Albumin: 4.5 g/dL (ref 3.5–5.2)
Alkaline Phosphatase: 79 U/L (ref 39–117)
BUN: 7 mg/dL (ref 6–23)
CO2: 30 meq/L (ref 19–32)
Calcium: 9.7 mg/dL (ref 8.4–10.5)
Chloride: 102 meq/L (ref 96–112)
Creatinine, Ser: 0.68 mg/dL (ref 0.40–1.20)
GFR: 102.85 mL/min (ref >60.00)
Glucose, Bld: 66 mg/dL — ABNORMAL LOW (ref 70–99)
Potassium: 3.5 meq/L (ref 3.5–5.1)
Sodium: 140 meq/L (ref 135–145)
Total Bilirubin: 0.5 mg/dL (ref 0.2–1.2)
Total Protein: 7.2 g/dL (ref 6.0–8.3)

## 2023-12-16 LAB — C-REACTIVE PROTEIN: CRP: <1 mg/dL (ref 0.5–20.0)

## 2023-12-16 LAB — SEDIMENTATION RATE: Sed Rate: 15 mm/h (ref 0–20)

## 2023-12-16 MED ORDER — IBUPROFEN 800 MG PO TABS: 800.0000 mg | ORAL_TABLET | Freq: Three times a day (TID) | ORAL | 1 refills | Status: DC | PRN

## 2023-12-16 MED ORDER — CYANOCOBALAMIN 1000 MCG/ML IJ SOLN
1000.0000 ug | Freq: Once | INTRAMUSCULAR | Status: AC
  Administered 2023-12-16: 1000 ug via INTRAMUSCULAR

## 2023-12-16 NOTE — Patient Instructions (Signed)
 Recurrent joint pain with elevated sedimentation rate Recurrent joint pain in left wrist, ankles, and plantar surfaces with elevated sedimentation rate suggests inflammation. Differential includes lupus or other inflammatory disorders. Fibromyalgia unlikely. Previous treatments limited by adverse effects to steroid injection, fibromyalgia meds and tramadol. Ibuprofen provides temporary relief. - Repeat rheumatoid factor, ANA, and C-reactive protein tests. - Notify rheumatologist if ANA is positive with elevated titer. - Refill ibuprofen 800 mg. - Advise alternating ibuprofen with meloxicam, not using both simultaneously. - Refer to rheumatologist. - Check kidney function due to NSAID use.   B12 deficiency Receiving B12 injections and oral supplementation. Monitoring ongoing to determine necessity of monthly injections. - Continue B12 injections and oral supplementation. - Follow up on B12 levels to determine need for continued injections.  Follow up date to be determined after lab review.

## 2023-12-16 NOTE — Progress Notes (Unsigned)
   Subjective:    Patient ID: Phyllis Adams, female    DOB: [birth year], 48 y.o.   MRN: [id number]  HPI Discussed the use of AI scribe software for clinical note transcription with the patient, who gave verbal consent to proceed.  History of Present Illness   Phyllis Adams is a 48 year old female who presents with a flare-up of recurrent joint pain.  She experiences recurrent joint pain primarily affecting her left wrist and ankles, which began a few days ago after a symptom-free period of two to three months. The pain initially started in her left wrist before affecting her ankles and the bottom of her feet, making ambulation difficult. During the symptom-free period, she did not experience any joint pain or associated symptoms.  She has a history of elevated sedimentation rate and positive ANA studies, although the most recent ANA was negative. She has consulted multiple rheumatologists and has been diagnosed with plantar fasciitis by a podiatrist, who did not find evidence of arthritis in her feet. A steroid injection in one foot resulted in a headache, a side effect she has experienced with similar treatments in the past.  Her current medication regimen includes ibuprofen 800 mg daily, which she finds somewhat effective but requires constant use. She alternates with meloxicam when necessary. She has experienced adverse effects with tramadol and steroid medications, including prednisone and Medrol, which cause stomach upset and headaches. She has not been able to tolerate Savella due to gastrointestinal side effects and loss of appetite.  She has a history of B12 deficiency, for which she receives B12 injections and takes 2000 mcg of B12 daily over the counter.        Review of Systems  See hp     Objective:   Physical Exam  General- No acute distress. Pleasant patient. Neck- Full range of motion, no jvd Lungs- Clear, even and unlabored. Heart- regular rate and rhythm. Neurologic-  CNII- XII grossly intact.  Bilateral wrist and ankles- no obvious swelling.      Assessment & Plan:  Assessment and Plan    Recurrent joint pain with elevated sedimentation rate Recurrent joint pain in left wrist, ankles, and plantar surfaces with elevated sedimentation rate suggests inflammation. Differential includes lupus or other inflammatory disorders. Fibromyalgia unlikely. Previous treatments limited by adverse effects to steroid injection, fibromyalgia meds and tramadol. Ibuprofen provides temporary relief. - Repeat rheumatoid factor, ANA, and C-reactive protein tests. - Notify rheumatologist if ANA is positive with elevated titer. - Refill ibuprofen 800 mg. - Advise alternating ibuprofen with meloxicam, not using both simultaneously. - Refer to rheumatologist. - Check kidney function due to NSAID use.   B12 deficiency Receiving B12 injections and oral supplementation. Monitoring ongoing to determine necessity of monthly injections. - Continue B12 injections and oral supplementation. - Follow up on B12 levels to determine need for continued injections.   follow up date to be determined after lab review.

## 2023-12-17 ENCOUNTER — Encounter: Payer: Self-pay | Admitting: Medical

## 2023-12-17 LAB — RHEUMATOID FACTOR: Rheumatoid fact SerPl-aCnc: <10 [IU]/mL (ref <14)

## 2023-12-17 LAB — ANA: Anti Nuclear Antibody (ANA): NEGATIVE

## 2023-12-17 NOTE — Addendum Note (Signed)
 Addended by: Gwenevere Abbot on: 12/17/2023 01:50 PM   Modules accepted: Orders

## 2023-12-20 ENCOUNTER — Encounter: Payer: Self-pay | Admitting: Medical

## 2023-12-20 NOTE — Addendum Note (Signed)
 Addended by: Serafina Damme on: 12/20/2023 08:26 PM   Modules accepted: Orders

## 2023-12-21 ENCOUNTER — Ambulatory Visit

## 2023-12-22 ENCOUNTER — Other Ambulatory Visit (INDEPENDENT_AMBULATORY_CARE_PROVIDER_SITE_OTHER)

## 2023-12-22 ENCOUNTER — Other Ambulatory Visit

## 2023-12-22 DIAGNOSIS — I10 Essential (primary) hypertension: Secondary | ICD-10-CM | POA: Diagnosis not present

## 2023-12-22 DIAGNOSIS — M2550 Pain in unspecified joint: Secondary | ICD-10-CM | POA: Diagnosis not present

## 2023-12-22 LAB — COMPREHENSIVE METABOLIC PANEL WITH GFR
ALT: 10 U/L (ref 0–35)
AST: 15 U/L (ref 0–37)
Albumin: 4.1 g/dL (ref 3.5–5.2)
Alkaline Phosphatase: 68 U/L (ref 39–117)
BUN: 10 mg/dL (ref 6–23)
CO2: 30 meq/L (ref 19–32)
Calcium: 9.5 mg/dL (ref 8.4–10.5)
Chloride: 103 meq/L (ref 96–112)
Creatinine, Ser: 0.72 mg/dL (ref 0.40–1.20)
GFR: 98.73 mL/min (ref >60.00)
Glucose, Bld: 89 mg/dL (ref 70–99)
Potassium: 3.8 meq/L (ref 3.5–5.1)
Sodium: 137 meq/L (ref 135–145)
Total Bilirubin: 0.3 mg/dL (ref 0.2–1.2)
Total Protein: 7 g/dL (ref 6.0–8.3)

## 2023-12-22 LAB — URIC ACID: Uric Acid, Serum: 4.1 mg/dL (ref 2.4–7.0)

## 2023-12-23 ENCOUNTER — Encounter: Payer: Self-pay | Admitting: Medical

## 2023-12-23 MED ORDER — CELECOXIB 200 MG PO CAPS: 200.0000 mg | ORAL_CAPSULE | Freq: Two times a day (BID) | ORAL | 0 refills | Status: DC

## 2023-12-23 NOTE — Addendum Note (Signed)
 Addended by: Serafina Damme on: 12/23/2023 05:26 AM   Modules accepted: Orders

## 2023-12-31 ENCOUNTER — Encounter: Payer: Self-pay | Admitting: Medical

## 2024-01-04 NOTE — Addendum Note (Signed)
 Addended by: Serafina Damme on: 01/04/2024 07:39 PM   Modules accepted: Orders

## 2024-01-16 ENCOUNTER — Encounter: Payer: Self-pay | Admitting: Medical

## 2024-01-17 MED ORDER — FLUCONAZOLE 150 MG PO TABS: 150.0000 mg | ORAL_TABLET | Freq: Once | ORAL | 0 refills | Status: AC

## 2024-01-17 NOTE — Telephone Encounter (Signed)
 Sending to DOD , due to provider being out of office

## 2024-01-18 ENCOUNTER — Ambulatory Visit

## 2024-01-18 DIAGNOSIS — E538 Deficiency of other specified B group vitamins: Secondary | ICD-10-CM | POA: Diagnosis not present

## 2024-01-18 MED ORDER — CYANOCOBALAMIN 1000 MCG/ML IJ SOLN
1000.0000 ug | Freq: Once | INTRAMUSCULAR | Status: AC
Start: 2024-01-18 — End: 2024-01-18
  Administered 2024-01-18: 1000 ug via INTRAMUSCULAR

## 2024-01-18 NOTE — Progress Notes (Signed)
 Pt here for monthly B12 injection per original order dated:   Last B12 injection:12/16/23  Last B12 level:10/19/23  B12 1000mcg given IM, and pt tolerated injection well.  Next B12 injection scheduled for: next month

## 2024-01-25 ENCOUNTER — Encounter: Payer: Self-pay | Admitting: Medical

## 2024-01-26 MED ORDER — CELECOXIB 200 MG PO CAPS
200.0000 mg | ORAL_CAPSULE | Freq: Two times a day (BID) | ORAL | 0 refills | Status: DC
Start: 2024-01-26 — End: 2024-05-30

## 2024-02-08 ENCOUNTER — Other Ambulatory Visit: Payer: Self-pay | Admitting: Medical Genetics

## 2024-02-15 ENCOUNTER — Ambulatory Visit

## 2024-02-28 ENCOUNTER — Other Ambulatory Visit

## 2024-02-29 ENCOUNTER — Encounter: Payer: Self-pay | Admitting: Medical

## 2024-03-01 MED ORDER — ALPRAZOLAM 0.5 MG PO TABS: ORAL_TABLET | ORAL | 0 refills | Status: AC

## 2024-03-01 NOTE — Addendum Note (Signed)
 Addended by: DORINA DALLAS HERO on: 03/01/2024 08:49 AM   Modules accepted: Orders

## 2024-03-02 ENCOUNTER — Ambulatory Visit (INDEPENDENT_AMBULATORY_CARE_PROVIDER_SITE_OTHER): Admitting: Podiatry

## 2024-03-02 ENCOUNTER — Encounter: Payer: Self-pay | Admitting: Podiatry

## 2024-03-02 DIAGNOSIS — M722 Plantar fascial fibromatosis: Secondary | ICD-10-CM

## 2024-03-02 NOTE — Progress Notes (Signed)
  Subjective:  Patient ID: Phyllis Adams, female    DOB: 1975-10-18,   MRN: [id number]  Chief Complaint  Patient presents with   Plantar Fasciitis    It's gotten worse since the last time I was seen.  It's mainly my left foot.  She said I have Plantar Fasciitis.  Another doctor said I have heel spurs.  My wife rubs my feet.  She said she feels knots in them.  The injection she gave me helped but it caused me to have migraines.    48 y.o. female presents for concern of continued left heel pain. She was lost to follow-up for a bit. Relates the injection in the foot gave her a migrain. She did have an injection for her spine and that resolved the pain in her foot and body for a little while. She has been stretching and massaging and wearing good shoes. Has seen rheumatology and on anti-inflammatories.     Denies any other pedal complaints. Denies n/v/f/c.   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   Primary osteoarthritis of both feet 08/12/2023   Primary osteoarthritis of both hands 08/12/2023   Clinical and radiographic findings were suggestive of osteoarthritis.  No synovitis was noted.     Retained orthopedic hardware 01/16/2016   Right leg pain 07/16/2016   Segmental and somatic dysfunction of 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   Spasm of muscle 04/15/2016    Objective:  Physical Exam: Vascular: DP/PT pulses 2/4 bilateral. CFT <3 seconds. Normal hair growth on digits. No edema.  Skin. No lacerations or abrasions bilateral feet.  Musculoskeletal: MMT 5/5  bilateral lower extremities in DF, PF, Inversion and Eversion. Deceased ROM in DF of ankle joint. Tender to the medial calcaneal tubercle bilaterally but worse on left  . No pain with achilles, PT or arch. No pain with calcaneal squeeze. Does have mild tenderness elsewhere but not as consistent Neurological: Sensation intact to light touch.   Assessment:   1. Plantar fasciitis of left foot       Plan:  Patient was evaluated and treated and all questions answered. Discussed plantar fasciitis with patient. Discussed radiculopathy and firbomyalgia as well that can be compounding the issue X-rays reviewed and discussed with patient. No acute fractures or dislocations noted. Mild spurring noted at inferior calcaneus.  Discussed treatment options including, ice, NSAIDS, supportive shoes, bracing, and stretching. Stretching exercises provided to be done on a daily basis.   AMB ref to PT  Continue anti-infalmmatories  Defers injeciton today.  Follow-up 10 weeks or sooner if any problems arise. In the meantime, encouraged to call the office with any questions, concerns, change in symptoms.      Asberry Failing, DPM

## 2024-03-13 ENCOUNTER — Encounter: Payer: Self-pay | Admitting: Medical

## 2024-03-13 ENCOUNTER — Other Ambulatory Visit: Payer: Self-pay | Admitting: Medical
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
## 2024-03-14 ENCOUNTER — Other Ambulatory Visit: Payer: Self-pay

## 2024-03-14 ENCOUNTER — Ambulatory Visit: Attending: Podiatry | Admitting: Rehabilitation

## 2024-03-14 DIAGNOSIS — M79672 Pain in left foot: Secondary | ICD-10-CM | POA: Diagnosis present

## 2024-03-14 DIAGNOSIS — M25675 Stiffness of left foot, not elsewhere classified: Secondary | ICD-10-CM | POA: Insufficient documentation

## 2024-03-14 DIAGNOSIS — R262 Difficulty in walking, not elsewhere classified: Secondary | ICD-10-CM | POA: Insufficient documentation

## 2024-03-14 DIAGNOSIS — M722 Plantar fascial fibromatosis: Secondary | ICD-10-CM | POA: Diagnosis not present

## 2024-03-14 DIAGNOSIS — M6281 Muscle weakness (generalized): Secondary | ICD-10-CM | POA: Diagnosis present

## 2024-03-16 MED ORDER — IBUPROFEN 800 MG PO TABS: 800.0000 mg | ORAL_TABLET | Freq: Three times a day (TID) | ORAL | 1 refills | Status: DC | PRN

## 2024-03-16 NOTE — Addendum Note (Signed)
 Addended by: DORINA DALLAS HERO on: 03/16/2024 04:32 PM   Modules accepted: Orders

## 2024-03-24 ENCOUNTER — Ambulatory Visit: Admitting: Rehabilitation

## 2024-03-24 ENCOUNTER — Encounter: Payer: Self-pay | Admitting: Rehabilitation

## 2024-03-24 DIAGNOSIS — M79672 Pain in left foot: Secondary | ICD-10-CM | POA: Diagnosis not present

## 2024-03-24 DIAGNOSIS — M6281 Muscle weakness (generalized): Secondary | ICD-10-CM

## 2024-03-24 DIAGNOSIS — R262 Difficulty in walking, not elsewhere classified: Secondary | ICD-10-CM

## 2024-03-24 DIAGNOSIS — M25675 Stiffness of left foot, not elsewhere classified: Secondary | ICD-10-CM
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
## 2024-03-29 ENCOUNTER — Ambulatory Visit

## 2024-04-07 ENCOUNTER — Ambulatory Visit: Attending: Podiatry

## 2024-04-09 ENCOUNTER — Encounter: Payer: Self-pay | Admitting: Rehabilitation

## 2024-04-14 ENCOUNTER — Ambulatory Visit: Admitting: Rehabilitation

## 2024-04-21 ENCOUNTER — Ambulatory Visit: Admitting: Rehabilitation

## 2024-05-04 ENCOUNTER — Ambulatory Visit: Admitting: Podiatry

## 2024-05-15 ENCOUNTER — Encounter: Payer: Self-pay | Admitting: Medical

## 2024-05-15 NOTE — Addendum Note (Signed)
 Addended by: DORINA DALLAS HERO on: 05/15/2024 12:33 PM   Modules accepted: Orders

## 2024-05-29 ENCOUNTER — Encounter: Payer: Self-pay | Admitting: Medical

## 2024-05-30 ENCOUNTER — Ambulatory Visit: Admitting: Medical

## 2024-05-30 ENCOUNTER — Ambulatory Visit: Admitting: Family Medicine

## 2024-05-30 ENCOUNTER — Ambulatory Visit: Payer: Self-pay

## 2024-05-30 ENCOUNTER — Encounter: Payer: Self-pay | Admitting: Medical

## 2024-05-30 VITALS — BP 143/77 | HR 67 | Temp 98.1°F | Resp 16 | Ht <= 58 in | Wt 179.4 lb

## 2024-05-30 DIAGNOSIS — M791 Myalgia, unspecified site: Secondary | ICD-10-CM | POA: Diagnosis not present

## 2024-05-30 DIAGNOSIS — M2550 Pain in unspecified joint: Secondary | ICD-10-CM

## 2024-05-30 DIAGNOSIS — I10 Essential (primary) hypertension: Secondary | ICD-10-CM

## 2024-05-30 DIAGNOSIS — M270 Developmental disorders of jaws: Secondary | ICD-10-CM

## 2024-05-30 DIAGNOSIS — R5383 Other fatigue: Secondary | ICD-10-CM

## 2024-05-30 DIAGNOSIS — R21 Rash and other nonspecific skin eruption: Secondary | ICD-10-CM

## 2024-05-30 DIAGNOSIS — R768 Other specified abnormal immunological findings in serum: Secondary | ICD-10-CM

## 2024-05-30 LAB — CK: Total CK: 85 U/L (ref 17–177)

## 2024-05-30 LAB — CBC WITH DIFFERENTIAL/PLATELET
Basophils Absolute: 0 K/uL (ref 0.0–0.1)
Basophils Relative: 0.6 % (ref 0.0–3.0)
Eosinophils Absolute: 0.1 K/uL (ref 0.0–0.7)
Eosinophils Relative: 1.9 % (ref 0.0–5.0)
HCT: 39.4 % (ref 36.0–46.0)
Hemoglobin: 12.8 g/dL (ref 12.0–15.0)
Lymphocytes Relative: 40.5 % (ref 12.0–46.0)
Lymphs Abs: 3 K/uL (ref 0.7–4.0)
MCHC: 32.5 g/dL (ref 30.0–36.0)
MCV: 79.9 fl (ref 78.0–100.0)
Monocytes Absolute: 0.5 K/uL (ref 0.1–1.0)
Monocytes Relative: 6.5 % (ref 3.0–12.0)
Neutro Abs: 3.8 K/uL (ref 1.4–7.7)
Neutrophils Relative %: 50.5 % (ref 43.0–77.0)
Platelets: 255 K/uL (ref 150.0–400.0)
RBC: 4.94 Mil/uL (ref 3.87–5.11)
RDW: 14.5 % (ref 11.5–15.5)
WBC: 7.4 K/uL (ref 4.0–10.5)

## 2024-05-30 LAB — COMPREHENSIVE METABOLIC PANEL WITH GFR
ALT: 9 U/L (ref 0–35)
AST: 14 U/L (ref 0–37)
Albumin: 4.5 g/dL (ref 3.5–5.2)
Alkaline Phosphatase: 82 U/L (ref 39–117)
BUN: 9 mg/dL (ref 6–23)
CO2: 29 meq/L (ref 19–32)
Calcium: 10.2 mg/dL (ref 8.4–10.5)
Chloride: 103 meq/L (ref 96–112)
Creatinine, Ser: 0.7 mg/dL (ref 0.40–1.20)
GFR: 101.81 mL/min (ref >60.00)
Glucose, Bld: 75 mg/dL (ref 70–99)
Potassium: 3.6 meq/L (ref 3.5–5.1)
Sodium: 141 meq/L (ref 135–145)
Total Bilirubin: 0.5 mg/dL (ref 0.2–1.2)
Total Protein: 7.5 g/dL (ref 6.0–8.3)

## 2024-05-30 LAB — C-REACTIVE PROTEIN: CRP: <1 mg/dL (ref 0.5–20.0)

## 2024-05-30 LAB — TSH: TSH: 0.92 u[IU]/mL (ref 0.35–5.50)

## 2024-05-30 LAB — T4, FREE: Free T4: 0.84 ng/dL (ref 0.60–1.60)

## 2024-05-30 LAB — SEDIMENTATION RATE: Sed Rate: 35 mm/h — ABNORMAL HIGH (ref 0–20)

## 2024-05-30 NOTE — Patient Instructions (Signed)
 Photosensitive rash with fatigue, myalgias, and arthralgias Photosensitive rash, fatigue, myalgias, and arthralgias suggest possible lupus or dermatomyositis. Conflicting ANA results. Further evaluation needed. - Repeat ANA, C-reactive protein, rheumatoid factor, and CK level. - Refer to rheumatologist Dr. Bernadine with updated lab results. - Consider dermatomyositis as a differential diagnosis.   Fatigue - Check blood volume, thyroid  studies, kidney function, and liver enzymes.  Arthritis of hands and feet Arthritis confirmed in hands and feet. -follow Rheumatoid factor  Lumbar radiculopathy with chronic low back pain Chronic low back pain with radicular symptoms. Previous epidural injections provided relief. - Follow up with [facility] regarding epidural scheduling.  Hypertension Blood pressure elevated at 150/90 mmHg. Non-adherence to medication regimen noted. - Monitor blood pressure at home and report readings via MyChart. - Ensure adherence to losartan  and amlodipine  regimen.  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 - Refer to ENT for evaluation and reassurance.  Follow up date to be determined after lab review

## 2024-05-30 NOTE — Progress Notes (Signed)
   Subjective:    Patient ID: Phyllis Adams, female    DOB: [birth year], 48 y.o.   MRN: [id number]  HPI Phyllis Adams is a 48 year old female who presents with fatigue, joint pain, and a rash.  She experiences significant fatigue and joint pain, which have progressively worsened. The fatigue is particularly pronounced after being outdoors and is accompanied by joint arthralgias affecting her hands and feet, making daily tasks challenging. She has been evaluated by two rheumatologists, with one diagnosing arthritis in both her feet and hands.  She reports a red rash that appears on her thigh when exposed to sunlight, especially when wearing shorts. The rash resolves once she returns indoors and is not present on her face or other body parts. Both the rash and fatigue are exacerbated by prolonged sun exposure.  She has a history of positive ANA tests with varying titers, which have been both positive and negative. She experiences muscle aches throughout her body, worsening with physical activity, and describes a sensation of needing to sit down due to pain.  Recently, she noticed a bony growth on her palate, which she believes appeared suddenly. Her daughter also noticed this growth.  She has a history of lumbar back pain with radicular symptoms, for which she has received epidural steroid injections in the past, with successful relief. However, she reports difficulty in scheduling further epidural treatments.  Her current medications include losartan  25 mg and amlodipine  10 mg for blood pressure management, which she did not take on the morning of the visit. She has previously used meloxicam  for inflammation and has had adverse reactions to certain medications, including possible side effect to  Medrol  and convinced side effect to Savella ,(which caused gastrointestinal side effects and appetite loss.)   Review of Systems See hpi    Objective:   Physical Exam  General- No acute distress.  Pleasant patient. Neck- Full range of motion, no jvd Lungs- Clear, even and unlabored. Heart- regular rate and rhythm. Neurologic- CNII- XII grossly intact.  Mouth- on exam of palate moderate large torus.  Skin- no obvious rash on thighs or on face presently.      Assessment & Plan:   Patient Instructions  Photosensitive rash with fatigue, myalgias, and arthralgias Photosensitive rash, fatigue, myalgias, and arthralgias suggest possible lupus or dermatomyositis. Conflicting ANA results. Further evaluation needed. - Repeat ANA, C-reactive protein, rheumatoid factor, and CK level. - Refer to rheumatologist Dr. Bernadine with updated lab results. - Consider dermatomyositis as a differential diagnosis.   Fatigue - Check blood volume, thyroid  studies, kidney function, and liver enzymes.  Arthritis of hands and feet Arthritis confirmed in hands and feet. -follow Rheumatoid factor  Lumbar radiculopathy with chronic low back pain Chronic low back pain with radicular symptoms. Previous epidural injections provided relief. - Follow up with [facility] regarding epidural scheduling.  Hypertension Blood pressure elevated at 150/90 mmHg. Non-adherence to medication regimen noted. - Monitor blood pressure at home and report readings via MyChart. - Ensure adherence to losartan  and amlodipine  regimen.  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 - Refer to ENT for evaluation and reassurance.  Follow up date to be determined after lab review   Phyllis Maxwell, PA-C   I personally spent a total of 45 minutes in the care of the patient today including performing a medically appropriate exam/evaluation, counseling and educating, placing orders, and documenting clinical information in the EHR.

## 2024-05-30 NOTE — Telephone Encounter (Signed)
 FYI Only or Action Required?: FYI only for provider.  Patient was last seen in primary care on 12/16/2023 by Dorina Loving, PA-C.  Called Nurse Triage reporting Foot Swelling.  Symptoms began ongoing for most symptoms. Mouth lump is new .  Interventions attempted: Other: pt has been under care for foot and hand issues without resolution.  Symptoms are: gradually worsening.  Triage Disposition: See PCP When Office is Open (Within 3 Days)  Patient/caregiver understands and will follow disposition?: Yes - pt wants to be seen today. Scheduled at [facility].                  Copied from CRM #[id number]. Topic: Clinical - Red Word Triage >> May 30, 2024  8:16 AM [place] A wrote: [facility] that prompted transfer to Nurse Triage: Patient has feet that are swollen and are painful. Patient is having is walking. Patient also states that her hands hurt and cramps up. Reason for Disposition  [1] MILD swelling of both ankles (i.e., pedal edema) AND [2] is a chronic symptom (recurrent or ongoing AND present > 4 weeks)  [1] MODERATE pain (e.g., interferes with normal activities, limping) AND [2] present > 3 days  Answer Assessment - Initial Assessment Questions 1. ONSET: When did the swelling start? (e.g., minutes, hours, days)     Over 1 year 2. LOCATION: What part of the leg is swollen?  Are both legs swollen or just one leg?     feet 3. SEVERITY: How bad is the swelling? (e.g., localized; mild, moderate, severe)     Mild more pain 4. REDNESS: Is there redness or signs of infection?     no 5. PAIN: Is the swelling painful to touch? If Yes, ask: How painful is it?   (Scale 1-10; mild, moderate or severe)     Moderate to severe 6. FEVER: Do you have a fever? If Yes, ask: What is it, how was it measured, and when did it start?      no 7. CAUSE: What do you think is causing the leg swelling?     Plantar fascitis 8. MEDICAL HISTORY: Do you have a history of  blood clots (e.g., DVT), cancer, heart failure, kidney disease, or liver failure?     no 9. RECURRENT SYMPTOM: Have you had leg swelling before? If Yes, ask: When was the last time? What happened that time?     ongoing 10. OTHER SYMPTOMS: Do you have any other symptoms? (e.g., chest pain, difficulty breathing)       Lump on roof of mouth, hand pain and cramping  Answer Assessment - Initial Assessment Questions Pt calling with several complaints. She has plantar fasciitis in both feet. She states the pain in moderate to severe. She has had this a long time, but is affecting her ability to work. She is also having hand cramping where she cannot grip things well. These are both issues that have been ongoing for a long time, and has been seen for in the past. Pt is also reporting a lump on the roof of her mouth that is making swallowing difficult.  PT also reports fatigue when in the sun.   1. ONSET: When did the pain start?      Ongoing 2. LOCATION: Where is the pain located?      Both feet 3. PAIN: How bad is the pain?    (Scale 1-10; or mild, moderate, severe)     Moderate - severe 4. WORK OR EXERCISE: Has there  been any recent work or exercise that involved this part of the body?      On her feet all day for work 5. CAUSE: What do you think is causing the foot pain?     Plantar fasciitis 6. OTHER SYMPTOMS: Do you have any other symptoms? (e.g., leg pain, rash, fever, numbness)     Lump on roof of her mouth, hand cramping  Protocols used: Leg Swelling and Edema-A-AH, Foot Pain-A-AH

## 2024-06-01 ENCOUNTER — Ambulatory Visit: Payer: Self-pay | Admitting: Medical

## 2024-06-01 LAB — ANTI-NUCLEAR AB-TITER (ANA TITER)
ANA TITER: 1:40 {titer} — ABNORMAL HIGH
ANA Titer 1: 1:80 {titer} — ABNORMAL HIGH

## 2024-06-01 LAB — ANA: Anti Nuclear Antibody (ANA): POSITIVE — AB

## 2024-06-01 LAB — RHEUMATOID FACTOR: Rheumatoid fact SerPl-aCnc: 12 [IU]/mL (ref <14)

## 2024-06-03 MED ORDER — MELOXICAM 7.5 MG PO TABS: ORAL_TABLET | ORAL | 0 refills | Status: DC

## 2024-06-03 NOTE — Addendum Note (Signed)
 Addended by: DORINA DALLAS HERO on: 06/03/2024 09:20 AM   Modules accepted: Orders

## 2024-06-03 NOTE — Addendum Note (Signed)
 Addended by: DORINA DALLAS HERO on: 06/03/2024 09:25 AM   Modules accepted: Orders

## 2024-06-05 ENCOUNTER — Ambulatory Visit (INDEPENDENT_AMBULATORY_CARE_PROVIDER_SITE_OTHER)

## 2024-06-05 ENCOUNTER — Encounter (INDEPENDENT_AMBULATORY_CARE_PROVIDER_SITE_OTHER): Payer: Self-pay

## 2024-06-05 ENCOUNTER — Ambulatory Visit: Admitting: Medical

## 2024-06-05 VITALS — BP 147/88 | HR 68 | Temp 98.1°F | Ht <= 58 in | Wt 170.0 lb

## 2024-06-05 DIAGNOSIS — H6123 Impacted cerumen, bilateral: Secondary | ICD-10-CM | POA: Diagnosis not present

## 2024-06-05 DIAGNOSIS — K1379 Other lesions of oral mucosa: Secondary | ICD-10-CM

## 2024-06-05 DIAGNOSIS — M270 Developmental disorders of jaws: Secondary | ICD-10-CM

## 2024-06-05 NOTE — Progress Notes (Signed)
 HPI:   Phyllis Adams is a 48 y.o. female who presents as a new patient for consultation of a hard palate lesion. Patient states that she has been undergoing evaluation and treatment for inflammatory diseases since March 2024. She was noted to have elevated ANA markers and has been following up with her PCP regarding these values. She states that shortly after this time period she was chewing gum and noted that it got stuck at the roof of her mouth. In her attempts to dislodge this piece of gum she discovered this lesion and became concerned given her recent increase in inflammatory markers. She denies any pain or bleeding in the area. Denies any known trauma to the area. She does wear a partial superior denture which she states has not changes in fitting.    PMH/Meds/All/SocHx/FamHx/ROS: 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   Angina pectoris 07/15/2022   Cardiac murmur 07/15/2022   Cigarette smoker 07/15/2022   Depression    Femur fracture (HCC) 2006   Hypertension    Lumbar radiculopathy 01/16/2016   Marijuana use 01/29/2023   Migraine    Mood disorder    Obesity (BMI 30.0-34.9) 07/15/2022   Osteoarthritis of spine with radiculopathy, 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   Primary osteoarthritis of both feet 08/12/2023   Primary osteoarthritis of both hands 08/12/2023   Clinical and radiographic findings were suggestive of osteoarthritis.  No synovitis was noted.     Retained orthopedic hardware 01/16/2016   Right leg pain 07/16/2016   Segmental and somatic dysfunction of 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   Spasm of muscle 04/15/2016   Past Surgical History:  Procedure Laterality Date   APPENDECTOMY     CHOLECYSTECTOMY     FEMUR FRACTURE SURGERY Right 2006   FEMUR IM ROD REMOVAL Right 03/11/2016   TUBAL LIGATION     No family history of bleeding disorders, wound healing problems or difficulty with anesthesia.   Social Connections: Unknown (05/30/2024)   Social Connection and Isolation Panel    Frequency of Communication with Friends and Family: Patient declined    Frequency of Social Gatherings with Friends and Family: Patient declined    Attends Religious Services: Patient declined    Database administrator or Organizations: No    Attends Engineer, structural: Not on file    Marital Status: Separated    Current Outpatient Medications:    albuterol  (VENTOLIN  HFA) 108 (90 Base) MCG/ACT inhaler, INHALE 2 PUFFS INTO THE LUNGS EVERY 6 HOURS AS NEEDED FOR WHEEZING OR SHORTNESS OF BREATH, Disp: 18 g, Rfl: 0   ALPRAZolam  (XANAX ) 0.5 MG tablet, 1 tab po30 minutes  prior to dental procedures for anxiety, Disp: 2 tablet, Rfl: 0   amLODipine  (NORVASC ) 10 MG tablet, 1 tab po q day, Disp: 90 tablet, Rfl: 3   butalbital -aspirin -caffeine  (FIORINAL ) 50-325-40 MG capsule, Take 1 capsule by mouth every 6 (six) hours as needed for headache., Disp: 12 capsule, Rfl: 0   fluticasone  (FLONASE ) 50 MCG/ACT nasal spray, Place 2 sprays into both nostrils daily., Disp: 16 g, Rfl: 1   losartan  (COZAAR ) 25 MG tablet, TAKE 1 TABLET(25 MG) BY MOUTH DAILY, Disp: 30 tablet, Rfl: 11   meloxicam  (MOBIC ) 7.5 MG tablet, 1-2 tab po q day, Disp: 60 tablet, Rfl: 0   ondansetron  (ZOFRAN -ODT) 8 MG disintegrating tablet, DISSOLVE 1 TABLET(8 MG) ON THE TONGUE EVERY 8 HOURS AS NEEDED FOR NAUSEA OR VOMITING, Disp: 15 tablet, Rfl:  0   SUMAtriptan  (IMITREX ) 50 MG tablet, TAKE 1 TABLET BY MOUTH AT ONSET OF MIGRAINE. MAY REPEAT IN 2 HOURS IF HEADACHE PERSISTS OR RECURS, Disp: 10 tablet, Rfl: 2   tiZANidine  (ZANAFLEX ) 4 MG tablet, Take 1 tablet (4 mg total) by mouth every 6 (six) hours as needed for muscle spasms., Disp: 30 tablet, Rfl: 0  Current Facility-Administered Medications:    dexamethasone  (DECADRON ) injection 4 mg, 4 mg, Intra-articular, Once,    triamcinolone  acetonide (KENALOG ) 10 MG/ML injection 2.5 mg, 2.5 mg, Intra-articular,  Once,   Facility-Administered Medications Ordered in Other Visits:    regadenoson  (LEXISCAN ) injection SOLN 0.4 mg, 0.4 mg, Intravenous, Once, O'Neal, Darryle Ned, MD A complete ROS was performed with pertinent positives/negatives noted in the HPI. The remainder of the ROS are negative.   Physical Exam:  There were no vitals taken for this visit. General: Well developed, well nourished. No acute distress. Voice normal Head/Face: Normocephalic. No sinus tenderness. Facial nerve intact and equal bilaterally. No facial lacerations. Eyes: PERRL, no scleral icterus or conjunctival hemorrhage. EOMI. Ears: No gross deformity. Cerumen impaction bilaterally. Normal external canal. Tympanic membrane in tact bilaterally Hearing: Normal speech reception.  Nose: No gross deformity or lesions. No purulent discharge. No turbinate hypertrophy. Mouth/Oropharynx: Lips without any lesions. 2cm x 2cm hard palate lesion, smooth, firm, no ulceration, no bleeding, well-circumscribed. Dentition poor. No mucosal lesions within the oropharynx. No tonsillar enlargement, exudate, or lesions. Pharyngeal walls symmetrical. Uvula midline. Tongue midline without lesions.  Neck: Trachea midline. No masses. No thyromegaly or nodules palpated. No crepitus. Lymphatic: No lymphadenopathy in the neck. Respiratory: No stridor or distress. Room air. Cardiovascular: Regular rate and rhythm. Extremities: No edema or cyanosis. Warm and well-perfused. Skin: No scars or lesions on face or neck. Neurologic:  Moving all extremities without gross abnormality. Other:  Independent Review of Additional Tests or Records: None Procedures: Cerumen impaction noted bilaterally.  Microscope and instrumentation used to remove wax bilaterally. Instructions to prevent wax buildup are given.   Impression & Plans: [name] is a 48 y.o. female with a hard palate lesion that has been unchanged since it was noted in March 2024. Bilateral  cerumen impaction noted on exam today.   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   Adah Malkin, DO [facility]
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
## 2024-06-06 ENCOUNTER — Ambulatory Visit: Attending: Physician Assistant | Admitting: Physician Assistant

## 2024-06-06 ENCOUNTER — Telehealth: Payer: Self-pay | Admitting: Pharmacist

## 2024-06-06 ENCOUNTER — Encounter: Payer: Self-pay | Admitting: Physician Assistant

## 2024-06-06 VITALS — BP 137/80 | HR 71 | Temp 97.6°F | Resp 12 | Ht 60.0 in | Wt 181.0 lb

## 2024-06-06 DIAGNOSIS — I10 Essential (primary) hypertension: Secondary | ICD-10-CM | POA: Diagnosis present

## 2024-06-06 DIAGNOSIS — F419 Anxiety disorder, unspecified: Secondary | ICD-10-CM | POA: Insufficient documentation

## 2024-06-06 DIAGNOSIS — M19041 Primary osteoarthritis, right hand: Secondary | ICD-10-CM | POA: Diagnosis present

## 2024-06-06 DIAGNOSIS — M2550 Pain in unspecified joint: Secondary | ICD-10-CM | POA: Insufficient documentation

## 2024-06-06 DIAGNOSIS — R202 Paresthesia of skin: Secondary | ICD-10-CM | POA: Diagnosis present

## 2024-06-06 DIAGNOSIS — M47816 Spondylosis without myelopathy or radiculopathy, lumbar region: Secondary | ICD-10-CM | POA: Diagnosis present

## 2024-06-06 DIAGNOSIS — M19072 Primary osteoarthritis, left ankle and foot: Secondary | ICD-10-CM | POA: Diagnosis present

## 2024-06-06 DIAGNOSIS — F1290 Cannabis use, unspecified, uncomplicated: Secondary | ICD-10-CM | POA: Insufficient documentation

## 2024-06-06 DIAGNOSIS — R768 Other specified abnormal immunological findings in serum: Secondary | ICD-10-CM | POA: Insufficient documentation

## 2024-06-06 DIAGNOSIS — Z8781 Personal history of (healed) traumatic fracture: Secondary | ICD-10-CM | POA: Diagnosis present

## 2024-06-06 DIAGNOSIS — F5101 Primary insomnia: Secondary | ICD-10-CM | POA: Insufficient documentation

## 2024-06-06 DIAGNOSIS — F32A Depression, unspecified: Secondary | ICD-10-CM | POA: Insufficient documentation

## 2024-06-06 DIAGNOSIS — M19042 Primary osteoarthritis, left hand: Secondary | ICD-10-CM | POA: Diagnosis present

## 2024-06-06 DIAGNOSIS — Z79899 Other long term (current) drug therapy: Secondary | ICD-10-CM | POA: Diagnosis present

## 2024-06-06 DIAGNOSIS — I209 Angina pectoris, unspecified: Secondary | ICD-10-CM | POA: Insufficient documentation

## 2024-06-06 DIAGNOSIS — M791 Myalgia, unspecified site: Secondary | ICD-10-CM | POA: Diagnosis present

## 2024-06-06 DIAGNOSIS — G8929 Other chronic pain: Secondary | ICD-10-CM | POA: Diagnosis present

## 2024-06-06 DIAGNOSIS — M25561 Pain in right knee: Secondary | ICD-10-CM | POA: Insufficient documentation

## 2024-06-06 DIAGNOSIS — M19071 Primary osteoarthritis, right ankle and foot: Secondary | ICD-10-CM | POA: Diagnosis present

## 2024-06-06 DIAGNOSIS — R5383 Other fatigue: Secondary | ICD-10-CM | POA: Diagnosis present

## 2024-06-06 DIAGNOSIS — F17210 Nicotine dependence, cigarettes, uncomplicated: Secondary | ICD-10-CM | POA: Diagnosis present

## 2024-06-06 DIAGNOSIS — M797 Fibromyalgia: Secondary | ICD-10-CM | POA: Insufficient documentation

## 2024-06-06 DIAGNOSIS — Z8669 Personal history of other diseases of the nervous system and sense organs: Secondary | ICD-10-CM | POA: Diagnosis present

## 2024-06-06 DIAGNOSIS — M7501 Adhesive capsulitis of right shoulder: Secondary | ICD-10-CM | POA: Insufficient documentation

## 2024-06-06 NOTE — Progress Notes (Signed)
 Pharmacy Note  Subjective: Patient presents today to [facility] for follow up office visit.   Patient seen by the pharmacist for counseling on hydroxychloroquine for positive ANA.  Prior therapy includes: OTC NSAIDs and prescription meloxicam .  Objective: CMP     Component Value Date/Time   NA 141 05/30/2024 1228   NA 141 07/15/2022 1107   K 3.6 05/30/2024 1228   CL 103 05/30/2024 1228   CO2 29 05/30/2024 1228   GLUCOSE 75 05/30/2024 1228   BUN 9 05/30/2024 1228   BUN 9 07/15/2022 1107   CREATININE 0.70 05/30/2024 1228   CREATININE 0.75 05/21/2020 1052   CALCIUM 10.2 05/30/2024 1228   PROT 7.5 05/30/2024 1228   ALBUMIN 4.5 05/30/2024 1228   AST 14 05/30/2024 1228   ALT 9 05/30/2024 1228   ALKPHOS 82 05/30/2024 1228   BILITOT 0.5 05/30/2024 1228   GFRNONAA >60 06/13/2017 0831   GFRAA >60 06/13/2017 0831    CBC    Component Value Date/Time   WBC 7.4 05/30/2024 1228   RBC 4.94 05/30/2024 1228   HGB 12.8 05/30/2024 1228   HCT 39.4 05/30/2024 1228   PLT 255.0 05/30/2024 1228   MCV 79.9 05/30/2024 1228   MCH 26.7 (L) 05/21/2020 1052   MCHC 32.5 05/30/2024 1228   RDW 14.5 05/30/2024 1228   LYMPHSABS 3.0 05/30/2024 1228   MONOABS 0.5 05/30/2024 1228   EOSABS 0.1 05/30/2024 1228   BASOSABS 0.0 05/30/2024 1228    Assessment/Plan: Patient was counseled on the purpose, proper use, and adverse effects of hydroxychloroquine including nausea/diarrhea, skin rash, headaches, and sun sensitivity.  Advised patient to wear sunscreen once starting hydroxychloroquine to reduce risk of rash associated with sun sensitivity.  Discussed importance of annual eye exams while on hydroxychloroquine to monitor to ocular toxicity and discussed importance of frequent laboratory monitoring.  Provided patient with eye exam form for baseline ophthalmologic exam.  Provided patient with educational materials on hydroxychloroquine and answered all questions.  Patient consented to  hydroxychloroquine. Will upload consent in the media tab.    Reviewed risk for QTC prolongation when used in combination with other QTc prolonging agents (including but not limited to antiarrhythmics, macrolide antibiotics, flouroquinolone antibiotics, haloperidol, quetiapine, olanzapine, risperidone, droperidol, ziprasidone, amitriptyline , citalopram, ondansetron , migraine triptans, and methadone ). EKG shows Qtc 445 wnl on 08/28/2023.   Dose will be Plaquenil 200 mg twice daily Monday through Friday.  Prescription pending lab results.  Phyllis Adams [facility] PharmD Candidate Class of [phone]

## 2024-06-06 NOTE — Telephone Encounter (Signed)
 Patient MAY be hydroxychloroquine new start pending baseline labs and pending ophthalmology clearance/opinion. Patient is reaching out to ophthalmologist this week  Sherry Pennant, PharmD, MPH, BCPS, CPP Clinical Pharmacist [facility])

## 2024-06-07 ENCOUNTER — Encounter: Payer: Self-pay | Admitting: Medical

## 2024-06-07 LAB — OPHTHALMOLOGY REPORT-SCANNED: A Comment: NORMAL

## 2024-06-08 ENCOUNTER — Ambulatory Visit: Payer: Self-pay | Admitting: Physician Assistant

## 2024-06-08 NOTE — Progress Notes (Signed)
 Please clarify if the patient is ready to initiate Plaquenil?  We received an eye examination performed on 06/07/2024 saying it was okay to proceed with Plaquenil.  MCH and MCHC are low, rest of CBC within normal limits.  ESR remains elevated.  SPEP did not reveal any abnormal protein bands. Urine protein creatinine ratio within normal limits.  CMP within normal limits.  Complements within normal limits.  Double-stranded DNA negative, anti-CCP negative, SCL 70 negative, RNP negative, Smith antibody negative, Ro and La antibodies negative.

## 2024-06-09 LAB — PROTEIN ELECTROPHORESIS, SERUM, WITH REFLEX
Albumin ELP: 4 g/dL (ref 3.8–4.8)
Alpha 1: 0.4 g/dL — ABNORMAL HIGH (ref 0.2–0.3)
Alpha 2: 0.8 g/dL (ref 0.5–0.9)
Beta 2: 0.5 g/dL (ref 0.2–0.5)
Beta Globulin: 0.4 g/dL (ref 0.4–0.6)
Gamma Globulin: 1.2 g/dL (ref 0.8–1.7)
Total Protein: 7.2 g/dL (ref 6.1–8.1)

## 2024-06-09 LAB — CBC WITH DIFFERENTIAL/PLATELET
Absolute Lymphocytes: 3468 {cells}/uL (ref 850–3900)
Absolute Monocytes: 553 {cells}/uL (ref 200–950)
Basophils Absolute: 24 {cells}/uL (ref 0–200)
Basophils Relative: 0.3 %
Eosinophils Absolute: 103 {cells}/uL (ref 15–500)
Eosinophils Relative: 1.3 %
HCT: 39.8 % (ref 35.0–45.0)
Hemoglobin: 12.3 g/dL (ref 11.7–15.5)
MCH: 25.9 pg — ABNORMAL LOW (ref 27.0–33.0)
MCHC: 30.9 g/dL — ABNORMAL LOW (ref 32.0–36.0)
MCV: 84 fL (ref 80.0–100.0)
MPV: 10.4 fL (ref 7.5–12.5)
Monocytes Relative: 7 %
Neutro Abs: 3753 {cells}/uL (ref 1500–7800)
Neutrophils Relative %: 47.5 %
Platelets: 263 Thousand/uL (ref 140–400)
RBC: 4.74 Million/uL (ref 3.80–5.10)
RDW: 14 % (ref 11.0–15.0)
Total Lymphocyte: 43.9 %
WBC: 7.9 Thousand/uL (ref 3.8–10.8)

## 2024-06-09 LAB — COMPREHENSIVE METABOLIC PANEL WITH GFR
AG Ratio: 1.6 (calc) (ref 1.0–2.5)
ALT: 7 U/L (ref 6–29)
AST: 13 U/L (ref 10–35)
Albumin: 4.3 g/dL (ref 3.6–5.1)
Alkaline phosphatase (APISO): 82 U/L (ref 31–125)
BUN: 8 mg/dL (ref 7–25)
CO2: 30 mmol/L (ref 20–32)
Calcium: 9.8 mg/dL (ref 8.6–10.2)
Chloride: 107 mmol/L (ref 98–110)
Creat: 0.67 mg/dL (ref 0.50–0.99)
Globulin: 2.7 g/dL (ref 1.9–3.7)
Glucose, Bld: 80 mg/dL (ref 65–99)
Potassium: 3.9 mmol/L (ref 3.5–5.3)
Sodium: 139 mmol/L (ref 135–146)
Total Bilirubin: 0.3 mg/dL (ref 0.2–1.2)
Total Protein: 7 g/dL (ref 6.1–8.1)
eGFR: 107 mL/min/1.73m2 (ref >=60)

## 2024-06-09 LAB — MUTATED CITRULLINATED VIMENTIN (MCV) ANTIBODY: MUTATED CITRULLINATED VIMENTIN (MCV) AB: <20 U/mL (ref <20)

## 2024-06-09 LAB — ANTI-SMITH ANTIBODY: ENA SM Ab Ser-aCnc: <1 AI

## 2024-06-09 LAB — C3 AND C4
C3 Complement: 167 mg/dL (ref 83–193)
C4 Complement: 36 mg/dL (ref 15–57)

## 2024-06-09 LAB — GLUCOSE 6 PHOSPHATE DEHYDROGENASE: G-6PDH: 17 U/g{Hb} (ref 7.0–20.5)

## 2024-06-09 LAB — SEDIMENTATION RATE: Sed Rate: 29 mm/h — ABNORMAL HIGH (ref 0–20)

## 2024-06-09 LAB — ANTI-DNA ANTIBODY, DOUBLE-STRANDED: ds DNA Ab: <1 [IU]/mL

## 2024-06-09 LAB — PROTEIN / CREATININE RATIO, URINE
Creatinine, Urine: 115 mg/dL (ref 20–275)
Protein/Creat Ratio: 130 mg/g{creat} (ref 24–184)
Protein/Creatinine Ratio: 0.13 mg/mg{creat} (ref 0.024–0.184)
Total Protein, Urine: 15 mg/dL (ref 5–24)

## 2024-06-09 LAB — SJOGRENS SYNDROME-B EXTRACTABLE NUCLEAR ANTIBODY: SSB (La) (ENA) Antibody, IgG: <1 AI

## 2024-06-09 LAB — RNP ANTIBODY: Ribonucleic Protein(ENA) Antibody, IgG: <1 AI

## 2024-06-09 LAB — SJOGRENS SYNDROME-A EXTRACTABLE NUCLEAR ANTIBODY: SSA (Ro) (ENA) Antibody, IgG: <1 AI

## 2024-06-09 LAB — ANTI-SCLERODERMA ANTIBODY: Scleroderma (Scl-70) (ENA) Antibody, IgG: <1 AI

## 2024-06-09 LAB — CYCLIC CITRUL PEPTIDE ANTIBODY, IGG: Cyclic Citrullin Peptide Ab: <16 U

## 2024-06-09 MED ORDER — HYDROXYCHLOROQUINE SULFATE 200 MG PO TABS: 200.0000 mg | ORAL_TABLET | Freq: Every day | ORAL | 2 refills | Status: DC

## 2024-06-09 NOTE — Progress Notes (Signed)
 Ok to send in prescription for plaquenil 200 mg 1 tablet by mouth daily.

## 2024-06-09 NOTE — Telephone Encounter (Signed)
 Ok to schedule an ultrasound

## 2024-06-09 NOTE — Telephone Encounter (Signed)
 Received patient's PLQ eye exam, per Waddell Craze, needed to clarify if patient wanted to start the Plaquenil.   Contacted the patient and patient states she would like to start PLQ. Patient states she is still having pain. Patient states she would like the medication sent to [facility] on [address] in [facility].

## 2024-06-12 NOTE — Progress Notes (Signed)
 SPEP did not reveal any abnormal protein bands.  G6PDH WNL  MCV negative

## 2024-06-14 ENCOUNTER — Other Ambulatory Visit: Payer: Self-pay | Admitting: Rheumatology

## 2024-06-14 ENCOUNTER — Encounter: Payer: Self-pay | Admitting: Medical

## 2024-06-14 NOTE — Telephone Encounter (Signed)
 Not placed in folder in front office

## 2024-06-14 NOTE — Telephone Encounter (Signed)
 Note:

## 2024-06-16 ENCOUNTER — Ambulatory Visit

## 2024-06-16 ENCOUNTER — Ambulatory Visit: Payer: Self-pay | Attending: Rheumatology | Admitting: Rheumatology

## 2024-06-16 DIAGNOSIS — R7689 Other specified abnormal immunological findings in serum: Secondary | ICD-10-CM | POA: Insufficient documentation

## 2024-06-16 DIAGNOSIS — M79641 Pain in right hand: Secondary | ICD-10-CM | POA: Insufficient documentation

## 2024-06-16 DIAGNOSIS — M79642 Pain in left hand: Secondary | ICD-10-CM | POA: Insufficient documentation

## 2024-06-16 NOTE — Progress Notes (Signed)
 Visit diagnosis-pain in both hands, positive ANA  Ultrasound examination of bilateral hands was performed per EULAR recommendations. Using 15 MHz transducer, grayscale and power Doppler bilateral second and third MCP joints and bilateral wrist joints both dorsal and volar aspects were evaluated to look for synovitis or tenosynovitis. The findings were there was no synovitis or tenosynovitis on ultrasound examination.   Impression: The limited ultrasound examination of bilateral hands was negative for synovitis.  Ultrasound results were discussed with the patient.  Patient was dissatisfied that the ultrasound did not show inflammation.  She states she has episodic increased pain and inflammation.  She is currently on hydroxychloroquine.  Explained to her that we will see response to Plaquenil over the next 3 months.  Patient stated that she might be seeking a second opinion as she is not getting a diagnosis.  Maya Nash, MD

## 2024-06-26 ENCOUNTER — Other Ambulatory Visit: Payer: Self-pay | Admitting: Medical Genetics

## 2024-06-26 DIAGNOSIS — Z006 Encounter for examination for normal comparison and control in clinical research program: Secondary | ICD-10-CM

## 2024-07-04 NOTE — Progress Notes (Deleted)
 Office Visit Note  Patient: Phyllis Adams             Date of Birth: 1976-01-20           MRN: [id number]             PCP: Phyllis Loving, PA-C Referring: Phyllis Adams Adams Visit Date: 07/18/2024 Occupation: Data Unavailable  Subjective:  No chief complaint on file.   History of Present Illness: Phyllis Adams is a 48 y.o. female ***     Activities of Daily Living:  Patient reports morning stiffness for *** {minute/hour:19697}.   Patient {ACTIONS;DENIES/REPORTS:21021675::Denies} nocturnal pain.  Difficulty dressing/grooming: {ACTIONS;DENIES/REPORTS:21021675::Denies} Difficulty climbing stairs: {ACTIONS;DENIES/REPORTS:21021675::Denies} Difficulty getting out of chair: {ACTIONS;DENIES/REPORTS:21021675::Denies} Difficulty using hands for taps, buttons, cutlery, and/or writing: {ACTIONS;DENIES/REPORTS:21021675::Denies}  No Rheumatology ROS completed.   PMFS History:  Patient Active Problem List   Diagnosis Date Noted   Primary osteoarthritis of both feet 08/12/2023   Primary osteoarthritis of both hands 08/12/2023   Marijuana use 01/29/2023   Angina pectoris 07/15/2022   Cardiac murmur 07/15/2022   Obesity (BMI 30.0-34.9) 07/15/2022   Cigarette smoker 07/15/2022   Abdominal bruit 07/15/2022   Depression 07/02/2022   Hypertension 07/02/2022   Migraine 07/02/2022   Mood disorder 07/02/2022   Adhesive capsulitis of right shoulder 03/26/2021   Spasm of muscle 04/15/2016   Osteoarthritis of spine with radiculopathy, 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   Segmental and somatic dysfunction of 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   Lumbar radiculopathy 01/16/2016   Retained orthopedic hardware 01/16/2016   Femur fracture (HCC) 2006    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   Angina pectoris 07/15/2022   Cardiac murmur 07/15/2022   Cigarette smoker 07/15/2022   Depression    Femur fracture (HCC) 2006    Hypertension    Lumbar radiculopathy 01/16/2016   Marijuana use 01/29/2023   Migraine    Mood disorder    Obesity (BMI 30.0-34.9) 07/15/2022   Osteoarthritis of spine with radiculopathy, 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   Primary osteoarthritis of both feet 08/12/2023   Primary osteoarthritis of both hands 08/12/2023   Clinical and radiographic findings were suggestive of osteoarthritis.  No synovitis was noted.     Retained orthopedic hardware 01/16/2016   Right leg pain 07/16/2016   Segmental and somatic dysfunction of 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   Spasm of muscle 04/15/2016    Family History  Problem Relation Age of Onset   Hypertension Mother    Hypertension Maternal Aunt    Hypertension Paternal Uncle    Hypertension Maternal Grandmother    Hypertension Maternal Grandfather    Healthy Son    Healthy Son    Healthy Son    Healthy Daughter    Healthy Daughter    Healthy Daughter    Past Surgical History:  Procedure Laterality Date   APPENDECTOMY     CHOLECYSTECTOMY     FEMUR FRACTURE SURGERY Right 2006   FEMUR IM ROD REMOVAL Right 03/11/2016   TUBAL LIGATION     Social History   Tobacco Use   Smoking status: Some Days    Types: Cigars, Cigarettes    Passive exposure: Never   Smokeless tobacco: Never   Tobacco comments:    3 cigars with marijuana    Smokes cigarettes when driving  Vaping Use   Vaping status: Never Used  Substance Use Topics   Alcohol use: No   Drug use: Yes    Types: Marijuana  Comment: occ   Social History   Social History Narrative   Are you right handed or left handed? Right Handed   Are you currently employed ? 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 No    Who lives with you? Kids - adult age    What type of home do you live in: 1 story or 2 story? Lives in a one story home          Immunization History  Administered Date(s) Administered   PPD Test 09/29/2017     Objective: Vital Signs:  There were no vitals taken for this visit.   Physical Exam   Musculoskeletal Exam: ***  CDAI Exam: CDAI Score: -- Patient Global: --; Provider Global: -- Swollen: --; Tender: -- Joint Exam 07/18/2024   No joint exam has been documented for this visit   There is currently no information documented on the homunculus. Go to the Rheumatology activity and complete the homunculus joint exam.  Investigation: No additional findings.  Imaging: US  LIMITED JOINT SPACE STRUCTURES UP BILAT Result Date: 06/16/2024 Ultrasound examination of bilateral hands was performed per EULAR recommendations. Using 15 MHz transducer, grayscale and power Doppler bilateral second and third MCP joints and bilateral wrist joints both dorsal and volar aspects were evaluated to look for synovitis or tenosynovitis. The findings were there was no synovitis or tenosynovitis on ultrasound examination. Impression: The limited ultrasound examination of bilateral hands was negative for synovitis.   Recent Labs: Lab Results  Component Value Date   WBC 7.9 06/06/2024   HGB 12.3 06/06/2024   PLT 263 06/06/2024   NA 139 06/06/2024   K 3.9 06/06/2024   CL 107 06/06/2024   CO2 30 06/06/2024   GLUCOSE 80 06/06/2024   BUN 8 06/06/2024   CREATININE 0.67 06/06/2024   BILITOT 0.3 06/06/2024   ALKPHOS 82 05/30/2024   AST 13 06/06/2024   ALT 7 06/06/2024   PROT 7.0 06/06/2024   PROT 7.2 06/06/2024   ALBUMIN 4.5 05/30/2024   CALCIUM 9.8 06/06/2024   GFRAA >60 06/13/2017   QFTBGOLDPLUS NEGATIVE 04/29/2022    Speciality Comments: PLQ Eye Exam: 06/07/2024 Normal @Eye  [facility] f/u 1 year  Procedures:  No procedures performed Allergies: Tramadol , Hydrocodone-acetaminophen , Prednisone , and Acetaminophen -codeine   Assessment / Plan:     Visit Diagnoses: Positive ANA (antinuclear antibody)  Polyarthralgia  Adhesive capsulitis of right shoulder  Paresthesias in left hand  Primary osteoarthritis of both  hands  Primary osteoarthritis of both feet  History of femur fracture  Chronic pain of right knee  Lumbar spondylosis  Fibromyalgia  Other fatigue  Primary insomnia  Primary hypertension  Anxiety and depression  History of migraine  Marijuana use  Cigarette smoker  Orders: No orders of the defined types were placed in this encounter.  No orders of the defined types were placed in this encounter.   Face-to-face time spent with patient was *** minutes. Greater than 50% of time was spent in counseling and coordination of care.  Follow-Up Instructions: No follow-ups on file.   Waddell CHRISTELLA Craze, PA-C  Note - This record has been created using Dragon software.  Chart creation errors have been sought, but may not always  have been located. Such creation errors do not reflect on  the standard of medical care.

## 2024-07-06 ENCOUNTER — Other Ambulatory Visit: Payer: Self-pay | Admitting: Medical
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
## 2024-07-18 ENCOUNTER — Ambulatory Visit: Admitting: Physician Assistant

## 2024-07-18 DIAGNOSIS — F5101 Primary insomnia: Secondary | ICD-10-CM

## 2024-07-18 DIAGNOSIS — M19042 Primary osteoarthritis, left hand: Secondary | ICD-10-CM

## 2024-07-18 DIAGNOSIS — Z8781 Personal history of (healed) traumatic fracture: Secondary | ICD-10-CM

## 2024-07-18 DIAGNOSIS — M19071 Primary osteoarthritis, right ankle and foot: Secondary | ICD-10-CM

## 2024-07-18 DIAGNOSIS — R5383 Other fatigue: Secondary | ICD-10-CM

## 2024-07-18 DIAGNOSIS — F17210 Nicotine dependence, cigarettes, uncomplicated: Secondary | ICD-10-CM

## 2024-07-18 DIAGNOSIS — M797 Fibromyalgia: Secondary | ICD-10-CM

## 2024-07-18 DIAGNOSIS — G8929 Other chronic pain: Secondary | ICD-10-CM

## 2024-07-18 DIAGNOSIS — M7501 Adhesive capsulitis of right shoulder: Secondary | ICD-10-CM

## 2024-07-18 DIAGNOSIS — F1290 Cannabis use, unspecified, uncomplicated: Secondary | ICD-10-CM

## 2024-07-18 DIAGNOSIS — F419 Anxiety disorder, unspecified: Secondary | ICD-10-CM

## 2024-07-18 DIAGNOSIS — I10 Essential (primary) hypertension: Secondary | ICD-10-CM

## 2024-07-18 DIAGNOSIS — M47816 Spondylosis without myelopathy or radiculopathy, lumbar region: Secondary | ICD-10-CM

## 2024-07-18 DIAGNOSIS — R202 Paresthesia of skin: Secondary | ICD-10-CM

## 2024-07-18 DIAGNOSIS — M2550 Pain in unspecified joint: Secondary | ICD-10-CM

## 2024-07-18 DIAGNOSIS — Z8669 Personal history of other diseases of the nervous system and sense organs: Secondary | ICD-10-CM

## 2024-07-18 DIAGNOSIS — R7689 Other specified abnormal immunological findings in serum: Secondary | ICD-10-CM

## 2024-07-24 ENCOUNTER — Encounter: Payer: Self-pay | Admitting: Cardiology

## 2024-07-24 ENCOUNTER — Encounter: Payer: Self-pay | Admitting: Physician Assistant

## 2024-07-24 ENCOUNTER — Ambulatory Visit: Payer: Self-pay | Attending: Physician Assistant | Admitting: Physician Assistant

## 2024-07-24 VITALS — BP 131/82 | HR 69 | Temp 97.6°F | Resp 14 | Ht <= 58 in | Wt 181.4 lb

## 2024-07-24 DIAGNOSIS — Z8669 Personal history of other diseases of the nervous system and sense organs: Secondary | ICD-10-CM | POA: Insufficient documentation

## 2024-07-24 DIAGNOSIS — R5383 Other fatigue: Secondary | ICD-10-CM | POA: Insufficient documentation

## 2024-07-24 DIAGNOSIS — M25561 Pain in right knee: Secondary | ICD-10-CM | POA: Diagnosis present

## 2024-07-24 DIAGNOSIS — Z8781 Personal history of (healed) traumatic fracture: Secondary | ICD-10-CM | POA: Insufficient documentation

## 2024-07-24 DIAGNOSIS — M47816 Spondylosis without myelopathy or radiculopathy, lumbar region: Secondary | ICD-10-CM | POA: Insufficient documentation

## 2024-07-24 DIAGNOSIS — Z79899 Other long term (current) drug therapy: Secondary | ICD-10-CM | POA: Diagnosis not present

## 2024-07-24 DIAGNOSIS — R700 Elevated erythrocyte sedimentation rate: Secondary | ICD-10-CM | POA: Insufficient documentation

## 2024-07-24 DIAGNOSIS — F419 Anxiety disorder, unspecified: Secondary | ICD-10-CM | POA: Diagnosis present

## 2024-07-24 DIAGNOSIS — M19041 Primary osteoarthritis, right hand: Secondary | ICD-10-CM | POA: Diagnosis present

## 2024-07-24 DIAGNOSIS — M79641 Pain in right hand: Secondary | ICD-10-CM | POA: Diagnosis not present

## 2024-07-24 DIAGNOSIS — F17210 Nicotine dependence, cigarettes, uncomplicated: Secondary | ICD-10-CM | POA: Insufficient documentation

## 2024-07-24 DIAGNOSIS — M7501 Adhesive capsulitis of right shoulder: Secondary | ICD-10-CM | POA: Diagnosis present

## 2024-07-24 DIAGNOSIS — G8929 Other chronic pain: Secondary | ICD-10-CM | POA: Insufficient documentation

## 2024-07-24 DIAGNOSIS — F32A Depression, unspecified: Secondary | ICD-10-CM | POA: Insufficient documentation

## 2024-07-24 DIAGNOSIS — M19042 Primary osteoarthritis, left hand: Secondary | ICD-10-CM | POA: Diagnosis present

## 2024-07-24 DIAGNOSIS — M19071 Primary osteoarthritis, right ankle and foot: Secondary | ICD-10-CM | POA: Insufficient documentation

## 2024-07-24 DIAGNOSIS — I10 Essential (primary) hypertension: Secondary | ICD-10-CM | POA: Insufficient documentation

## 2024-07-24 DIAGNOSIS — F1290 Cannabis use, unspecified, uncomplicated: Secondary | ICD-10-CM | POA: Insufficient documentation

## 2024-07-24 DIAGNOSIS — M19072 Primary osteoarthritis, left ankle and foot: Secondary | ICD-10-CM | POA: Diagnosis present

## 2024-07-24 DIAGNOSIS — M2550 Pain in unspecified joint: Secondary | ICD-10-CM | POA: Insufficient documentation

## 2024-07-24 DIAGNOSIS — M79642 Pain in left hand: Secondary | ICD-10-CM | POA: Diagnosis present

## 2024-07-24 DIAGNOSIS — R202 Paresthesia of skin: Secondary | ICD-10-CM | POA: Diagnosis present

## 2024-07-24 DIAGNOSIS — R7689 Other specified abnormal immunological findings in serum: Secondary | ICD-10-CM | POA: Diagnosis not present

## 2024-07-24 DIAGNOSIS — M797 Fibromyalgia: Secondary | ICD-10-CM | POA: Diagnosis present

## 2024-07-24 DIAGNOSIS — F5101 Primary insomnia: Secondary | ICD-10-CM | POA: Diagnosis present

## 2024-07-25 ENCOUNTER — Ambulatory Visit: Payer: Self-pay | Admitting: Physician Assistant

## 2024-07-25 LAB — COMPREHENSIVE METABOLIC PANEL WITH GFR
AG Ratio: 1.5 (calc) (ref 1.0–2.5)
ALT: 8 U/L (ref 6–29)
AST: 13 U/L (ref 10–35)
Albumin: 4.3 g/dL (ref 3.6–5.1)
Alkaline phosphatase (APISO): 81 U/L (ref 31–125)
BUN: 14 mg/dL (ref 7–25)
CO2: 29 mmol/L (ref 20–32)
Calcium: 9.7 mg/dL (ref 8.6–10.2)
Chloride: 106 mmol/L (ref 98–110)
Creat: 0.85 mg/dL (ref 0.50–0.99)
Globulin: 2.9 g/dL (ref 1.9–3.7)
Glucose, Bld: 75 mg/dL (ref 65–99)
Potassium: 4.2 mmol/L (ref 3.5–5.3)
Sodium: 140 mmol/L (ref 135–146)
Total Bilirubin: 0.3 mg/dL (ref 0.2–1.2)
Total Protein: 7.2 g/dL (ref 6.1–8.1)
eGFR: 84 mL/min/1.73m2 (ref >=60)

## 2024-07-25 LAB — GENECONNECT MOLECULAR SCREEN: Genetic Analysis Overall Interpretation: NEGATIVE

## 2024-07-25 LAB — SEDIMENTATION RATE: Sed Rate: 31 mm/h — ABNORMAL HIGH (ref 0–20)

## 2024-07-25 LAB — CBC WITH DIFFERENTIAL/PLATELET
Absolute Lymphocytes: 3162 {cells}/uL (ref 850–3900)
Absolute Monocytes: 564 {cells}/uL (ref 200–950)
Basophils Absolute: 33 {cells}/uL (ref 0–200)
Basophils Relative: 0.4 %
Eosinophils Absolute: 174 {cells}/uL (ref 15–500)
Eosinophils Relative: 2.1 %
HCT: 37.9 % (ref 35.0–45.0)
Hemoglobin: 12 g/dL (ref 11.7–15.5)
MCH: 26.1 pg — ABNORMAL LOW (ref 27.0–33.0)
MCHC: 31.7 g/dL — ABNORMAL LOW (ref 32.0–36.0)
MCV: 82.6 fL (ref 80.0–100.0)
MPV: 10.9 fL (ref 7.5–12.5)
Monocytes Relative: 6.8 %
Neutro Abs: 4366 {cells}/uL (ref 1500–7800)
Neutrophils Relative %: 52.6 %
Platelets: 251 Thousand/uL (ref 140–400)
RBC: 4.59 Million/uL (ref 3.80–5.10)
RDW: 13.6 % (ref 11.0–15.0)
Total Lymphocyte: 38.1 %
WBC: 8.3 Thousand/uL (ref 3.8–10.8)

## 2024-07-25 NOTE — Progress Notes (Signed)
 CBC stable.  CMP WNL ESR remains elevated-we will continue to monitor.  Continue taking plaquenil as prescribed.

## 2024-07-27 ENCOUNTER — Encounter: Payer: Self-pay | Admitting: Medical

## 2024-07-27 ENCOUNTER — Encounter: Admitting: Medical

## 2024-07-27 NOTE — Telephone Encounter (Signed)
 Pt called appointment scheduled for 11/21

## 2024-07-28 ENCOUNTER — Encounter: Payer: Self-pay | Admitting: Medical

## 2024-07-28 ENCOUNTER — Ambulatory Visit (INDEPENDENT_AMBULATORY_CARE_PROVIDER_SITE_OTHER): Admitting: Medical

## 2024-07-28 ENCOUNTER — Ambulatory Visit: Payer: Self-pay | Admitting: Medical

## 2024-07-28 VITALS — BP 130/80 | HR 68 | Temp 98.1°F | Resp 16 | Ht <= 58 in | Wt 180.8 lb

## 2024-07-28 DIAGNOSIS — I10 Essential (primary) hypertension: Secondary | ICD-10-CM

## 2024-07-28 DIAGNOSIS — R350 Frequency of micturition: Secondary | ICD-10-CM | POA: Diagnosis not present

## 2024-07-28 DIAGNOSIS — R0789 Other chest pain: Secondary | ICD-10-CM

## 2024-07-28 DIAGNOSIS — M542 Cervicalgia: Secondary | ICD-10-CM

## 2024-07-28 DIAGNOSIS — Z833 Family history of diabetes mellitus: Secondary | ICD-10-CM

## 2024-07-28 LAB — TROPONIN I (HIGH SENSITIVITY): High Sens Troponin I: 4 ng/L (ref 2–17)

## 2024-07-28 LAB — HEMOGLOBIN A1C: Hgb A1c MFr Bld: 5.5 % (ref 4.6–6.5)

## 2024-07-28 MED ORDER — HYDROXYZINE HCL 10 MG PO TABS: 10.0000 mg | ORAL_TABLET | Freq: Three times a day (TID) | ORAL | 0 refills | Status: AC | PRN

## 2024-07-28 MED ORDER — AMLODIPINE BESYLATE 10 MG PO TABS: 10.0000 mg | ORAL_TABLET | Freq: Every day | ORAL | 1 refills | Status: AC

## 2024-07-28 NOTE — Patient Instructions (Addendum)
 Atypical chest pain Intermittent burning sensation in upper left chest. Previous cardiac workup normal. Differential includes cardiac versus musculoskeletal etiology. Some pain on palpation in left upper pectoralis region where she pointed to as source of pain yesterday.(no pain today except when I palpated) - Performed EKG and cardiac protein study. EKG sinus rhythm.  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 MRI showed degenerative changes at L4-L5 and L5-S1. Symptoms consistent with cervical radiculopathy. - Continue meloxicam  15 mg daily. -keep cardiologist follow up on Aug 08, 2024  Chronic joint and muscle pain under evaluation for systemic lupus erythematosus Chronic joint and muscle pain managed with hydroxychloroquine . Rheumatologist involved. Pain manageable. Elevated sed rate noted. - Continue hydroxychloroquine  as prescribed by rheumatologist.  Generalized anxiety disorder Chronic anxiety related to pain and stressors. Previous Buspar  trial had side effects. GAD-7 score of 10 indicates moderate anxiety. Hydroxyzine  considered. - Prescribed hydroxyzine  for anxiety, to be taken at night initially to assess drowsiness. - Advised use of hydroxyzine  up to every 8 hours as needed, with caution regarding drowsiness.  Frequent urination more at night.  Increased nocturnal urination. No daytime urgency or dark urine. Family history of diabetes. Differential includes diabetes mellitus and UTI. - Ordered urine culture to rule out infection. - Ordered HbA1c to assess for diabetes mellitus.  Follow up date to be determined after lab review

## 2024-07-28 NOTE — Progress Notes (Signed)
 This encounter was created in error - please disregard.  Prior to office visit very early and then decided to leave as I was running behind about 30 to 40 minutes.  She is scheduled next day with me.

## 2024-07-28 NOTE — Progress Notes (Addendum)
 Subjective:    Patient ID: Phyllis Adams, female    DOB: [birth year], 48 y.o.   MRN: [id number]  HPI Phyllis Adams is a 48 year old female with anxiety and musculoskeletal pain who presents with chest pain and anxiety.  She experiences intermittent burning sensation in the upper left chest, sometimes occurring at rest, such as in bed. The pain prompted her to make an appointment, having occurred two days before her visit and again the day before. Previous stress test and echocardiogram in January 2023 were normal except for a heart murmur.  Chronic anxiety is present, related to her life situation, including unemployment and guardianship of her brother. Anxiety correlates with joint pains and muscle aches. She previously took buspirone  but stopped due to side effects. Her GAD-7 score is 10.  She has a history of a pinched nerve in her neck, with burning pain radiating from her neck to her arm, associated with a herniated disc. She is on meloxicam  15 mg for low back, hip, neck, and foot pain.  She experiences frequent nighttime urination over the past month. Family history includes diabetes in her brother and grandmother. She is concerned about blood sugar levels and mentions an abnormal MCH level in her blood work. She is on hydroxychloroquine  200 mg for potential lupus, which has helped manage her pain.   Review of Systems See hpi    Objective:   Physical Exam  General Mental Status- Alert. General Appearance- Not in acute distress.   Skin General: Color- Normal Color. Moisture- Normal Moisture.  Neck Carotid Arteries- Normal color. Moisture- Normal Moisture. No carotid bruits. No JVD.  Chest and Lung Exam Auscultation: Breath Sounds:-Normal.  Cardiovascular Auscultation:Rythm- Regular. Murmurs & Other Heart Sounds:Auscultation of the heart reveals- No Murmurs.  Abdomen Inspection:-Inspeection Normal. Palpation/Percussion:Note:No mass. Palpation and Percussion of the  abdomen reveal- Non Tender, Non Distended + BS, no rebound or guarding.   Neurologic Cranial Nerve exam:- CN III-XII intact(No nystagmus), symmetric smile. Strength:- 5/5 equal and symmetric strength both upper and lower extremities.   Anterior chest- left upper out pec mild tender on palpaton. No pain in pec on left shoudler or arm rom.    Assessment & Plan:   Atypical chest pain Intermittent burning sensation in upper left chest. Previous cardiac workup normal. Differential includes cardiac versus musculoskeletal etiology. Some pain on palpation in left upper pectoralis region where she pointed to as source of pain yesterday.(no pain today except when I palpated) - Performed EKG and cardiac protein study. EKG sinus rhythm. 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 MRI showed degenerative changes at L4-L5 and L5-S1. Symptoms consistent with cervical radiculopathy. - Continue meloxicam  15 mg daily. -keep cardiologist follow up on Aug 08, 2024  Chronic joint and muscle pain under evaluation for systemic lupus erythematosus Chronic joint and muscle pain managed with hydroxychloroquine . Rheumatologist involved. Pain manageable. Elevated sed rate noted. - Continue hydroxychloroquine  as prescribed by rheumatologist.  Generalized anxiety disorder Chronic anxiety related to pain and stressors. Previous Buspar  trial had side effects. GAD-7 score of 10 indicates moderate anxiety. Hydroxyzine  considered. - Prescribed hydroxyzine  for anxiety, to be taken at night initially to assess drowsiness. - Advised use of hydroxyzine  up to every 8 hours as needed, with caution regarding drowsiness.  Frequent urination more at night.  Increased nocturnal urination. No daytime urgency or dark urine. Family history of diabetes. Differential includes  diabetes  mellitus and UTI. - Ordered urine culture to rule out infection. - Ordered HbA1c to assess for diabetes mellitus.  Follow up date to be determined after lab review   I personally spent a total of 40 minutes in the care of the patient today including performing a medically appropriate exam/evaluation, placing orders, referring and communicating with other health care professionals, and documenting clinical information in the EHR.

## 2024-07-29 LAB — URINE CULTURE
MICRO NUMBER:: 17267990
Result:: NO GROWTH
SPECIMEN QUALITY:: ADEQUATE

## 2024-07-30 ENCOUNTER — Telehealth: Payer: Self-pay | Admitting: Medical

## 2024-07-30 NOTE — Telephone Encounter (Signed)
 Open to review medication list.

## 2024-07-31 ENCOUNTER — Other Ambulatory Visit: Payer: Self-pay | Admitting: Medical

## 2024-07-31 DIAGNOSIS — Z1231 Encounter for screening mammogram for malignant neoplasm of breast: Secondary | ICD-10-CM

## 2024-08-03 ENCOUNTER — Other Ambulatory Visit: Payer: Self-pay | Admitting: Medical

## 2024-08-08 ENCOUNTER — Encounter: Payer: Self-pay | Admitting: Cardiology

## 2024-08-08 ENCOUNTER — Ambulatory Visit: Admitting: Cardiology

## 2024-08-08 DIAGNOSIS — Z1231 Encounter for screening mammogram for malignant neoplasm of breast: Secondary | ICD-10-CM

## 2024-08-18 ENCOUNTER — Other Ambulatory Visit: Payer: Self-pay | Admitting: Medical

## 2024-08-28 ENCOUNTER — Encounter: Payer: Self-pay | Admitting: Medical

## 2024-08-28 NOTE — Telephone Encounter (Signed)
 Plaquenil  was started in early October 2025.  It can take a full 3 months to notice the maximum benefits. If she is having vision changes she should follow-up with ophthalmology for further evaluation. In regards to memory change I would recommend follow-up with PCP-this is not a common side effect of Plaquenil .

## 2024-08-29 ENCOUNTER — Ambulatory Visit (INDEPENDENT_AMBULATORY_CARE_PROVIDER_SITE_OTHER): Admitting: Medical

## 2024-08-29 ENCOUNTER — Ambulatory Visit (HOSPITAL_BASED_OUTPATIENT_CLINIC_OR_DEPARTMENT_OTHER)
Admission: RE | Admit: 2024-08-29 | Discharge: 2024-08-29 | Disposition: A | Discharge status: Home / Self Care | Source: Ambulatory Visit | Attending: Medical | Admitting: Medical

## 2024-08-29 ENCOUNTER — Encounter: Payer: Self-pay | Admitting: Medical

## 2024-08-29 ENCOUNTER — Ambulatory Visit: Payer: Self-pay

## 2024-08-29 VITALS — BP 120/88 | HR 88 | Temp 98.4°F | Resp 16 | Ht <= 58 in | Wt 181.0 lb

## 2024-08-29 DIAGNOSIS — R413 Other amnesia: Secondary | ICD-10-CM

## 2024-08-29 DIAGNOSIS — G4459 Other complicated headache syndrome: Secondary | ICD-10-CM

## 2024-08-29 DIAGNOSIS — R5383 Other fatigue: Secondary | ICD-10-CM

## 2024-08-29 DIAGNOSIS — R202 Paresthesia of skin: Secondary | ICD-10-CM | POA: Diagnosis not present

## 2024-08-29 DIAGNOSIS — M542 Cervicalgia: Secondary | ICD-10-CM | POA: Diagnosis not present

## 2024-08-29 DIAGNOSIS — R29898 Other symptoms and signs involving the musculoskeletal system: Secondary | ICD-10-CM

## 2024-08-29 DIAGNOSIS — Z1231 Encounter for screening mammogram for malignant neoplasm of breast: Secondary | ICD-10-CM

## 2024-08-29 LAB — CBC WITH DIFFERENTIAL/PLATELET
Basophils Absolute: 0 K/uL (ref 0.0–0.1)
Basophils Relative: 0.5 % (ref 0.0–3.0)
Eosinophils Absolute: 0.1 K/uL (ref 0.0–0.7)
Eosinophils Relative: 1.7 % (ref 0.0–5.0)
HCT: 39.8 % (ref 36.0–46.0)
Hemoglobin: 12.8 g/dL (ref 12.0–15.0)
Lymphocytes Relative: 41.9 % (ref 12.0–46.0)
Lymphs Abs: 2.7 K/uL (ref 0.7–4.0)
MCHC: 32.2 g/dL (ref 30.0–36.0)
MCV: 81.2 fl (ref 78.0–100.0)
Monocytes Absolute: 0.5 K/uL (ref 0.1–1.0)
Monocytes Relative: 8.3 % (ref 3.0–12.0)
Neutro Abs: 3 K/uL (ref 1.4–7.7)
Neutrophils Relative %: 47.6 % (ref 43.0–77.0)
Platelets: 222 K/uL (ref 150.0–400.0)
RBC: 4.9 Mil/uL (ref 3.87–5.11)
RDW: 14.8 % (ref 11.5–15.5)
WBC: 6.4 K/uL (ref 4.0–10.5)

## 2024-08-29 LAB — COMPREHENSIVE METABOLIC PANEL WITH GFR
ALT: 10 U/L (ref 3–35)
AST: 14 U/L (ref 5–37)
Albumin: 4 g/dL (ref 3.5–5.2)
Alkaline Phosphatase: 95 U/L (ref 39–117)
BUN: 10 mg/dL (ref 6–23)
CO2: 28 meq/L (ref 19–32)
Calcium: 9.5 mg/dL (ref 8.4–10.5)
Chloride: 105 meq/L (ref 96–112)
Creatinine, Ser: 0.7 mg/dL (ref 0.40–1.20)
GFR: 101.63 mL/min
Glucose, Bld: 70 mg/dL (ref 70–99)
Potassium: 3.9 meq/L (ref 3.5–5.1)
Sodium: 140 meq/L (ref 135–145)
Total Bilirubin: 0.3 mg/dL (ref 0.2–1.2)
Total Protein: 6.9 g/dL (ref 6.0–8.3)

## 2024-08-29 LAB — TSH: TSH: 0.89 u[IU]/mL (ref 0.35–5.50)

## 2024-08-29 LAB — VITAMIN B12: Vitamin B-12: 381 pg/mL (ref 211–911)

## 2024-08-29 LAB — T4, FREE: Free T4: 0.73 ng/dL (ref 0.60–1.60)

## 2024-08-29 NOTE — Patient Instructions (Signed)
 Left-sided weakness and paresthesia intermittently weak over past 3 weeks. Last event sunday night. Lasted 15-20 minutes. Events are associated with both left side headache and neck pain. Often occurring almost nightly. Intermittent left-sided weakness and paresthesia, primarily when sitting or lying down. Differential includes cervical spine issues or TIA? - Ordered cervical spine x-ray. STAT - Ordered CT scan of the head. STAT - Advised emergency department visit if symptoms reoccur and persist. Extensive explained in light of upcoming long holiday and pending prior authorization.  Neck pain with headache Intermittent neck pain with unilateral headaches, possibly related to cervical spine issues. - Ordered cervical spine x-ray. - Ordered CT scan of the head.  Cognitive impairment and memory changes Memory issues with good mini mental status score. Differential includes low B12 or metabolic causes. - Ordered metabolic panel, CBC, TSH, T4, B1, and B12 levels. - Established baseline mini mental status. 28/30 today - Consider neurologist referral if symptoms worsen overtime  and  labs inconclusive.  Fatigue Chronic fatigue with some improvement in muscle and body aches. Possible relation to underlying conditions or medication side effects. - Ordered metabolic panel, CBC, TSH, T4, B1, and B12 levels.  Follow up date to be determined after lab and imaging review

## 2024-08-29 NOTE — Progress Notes (Signed)
 "  Subjective:    Patient ID: Phyllis Adams, female    DOB: August 15, 1976, 48 y.o.   MRN: [id number]  HPI  Phyllis Adams is a 48 year old female with suspected lupus who presents with memory issues, intermittent ha and left-sided weakness.  Over the past three weeks, she has had memory lapses with frequent forgetfulness, misplacing items, and needing to ask for directions while driving, sometimes forgetting her intended destination.    Over the same period, she has had intermittent left-sided weakness, mainly when sitting or lying down, described as a limb falling asleep with cool sensation and reduced mobility/ weakness. Symptoms improve with massage of the arm and leg. The last episode was Sunday night and lasted about 15 minutes. Associated with neck pain and headaches.   She has intermittent neck pain and headaches that occur with the weakness and tingling. Headaches are not migrainous. She has no eye pain.  She takes Plaquenil  for suspected lupus with some improvement in muscle and body aches. She uses anxiety medication as needed but not daily. B12 injections were stopped after six months.  Note episodes ocurring almost every night but last event on sunday night. HA level typcially on average 6/10. Other symptoms subside quickly but ha will slowly taper off over 1-2 hours. will use tylenol  for ha.  Pt states when states bp is controlled but has not been checking during reported events. When she check bp will check every 2-3 days will get 130/80.      Lmp- 3 years. Menopausal.  Review of Systems  Constitutional:  Positive for fatigue. Negative for chills.  HENT:  Negative for congestion.   Respiratory:  Negative for cough, chest tightness and wheezing.   Cardiovascular:  Negative for chest pain and palpitations.  Gastrointestinal:  Negative for abdominal distention, abdominal pain and constipation.  Musculoskeletal:  Positive for arthralgias and myalgias.       Less now since  on plaquenil   Skin:  Negative for rash.  Neurological:  Negative for dizziness, speech difficulty, weakness and light-headedness.       None currently but see hpi.  Psychiatric/Behavioral:  Negative for behavioral problems and dysphoric mood. The patient is not nervous/anxious.     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  No synovitis was noted.     Retained orthopedic hardware 01/16/2016   Segmental and somatic dysfunction of 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   Spasm of muscle 04/15/2016     Social History   Socioeconomic History   Marital status: Married    Spouse name: Not on file   Number of children: Not on file   Years of education: Not on file   Highest education level: Bachelor's degree (e.g., BA, AB, BS)  Occupational History   Not on file  Tobacco Use   Smoking status: Some Days    Types: Cigars, Cigarettes    Passive exposure: Never  Smokeless tobacco: Never   Tobacco comments:    3 cigars with marijuana    Smokes cigarettes when driving  Vaping Use   Vaping status: Never Used  Substance and Sexual Activity   Alcohol use: No   Drug use: Yes    Types: Marijuana    Comment: occ   Sexual activity: Yes    Birth control/protection: Other-see comments, Surgical  Other Topics Concern   Not on file  Social History Narrative   Are you right handed or left handed? Right Handed    Are you currently employed ? 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 No    Who lives with you? Kids - adult age    What type of home do you live in: 1 story or 2 story? Lives in a one story home        Social Drivers of Health   Tobacco Use: High Risk (08/29/2024)   Patient History    Smoking Tobacco Use: Some Days    Smokeless Tobacco Use: Never    Passive Exposure: Never  Financial Resource Strain: Medium Risk (07/25/2024)   Overall Financial Resource Strain (CARDIA)    Difficulty of Paying Living Expenses: Somewhat hard  Food Insecurity: Food Insecurity Present (07/25/2024)   Epic    Worried About Programme Researcher, Broadcasting/film/video in the Last Year: Often true    Ran Out of Food in the Last Year: Often true  Transportation Needs: No Transportation Needs (07/25/2024)   Epic    Lack of Transportation (Medical): No    Lack of Transportation (Non-Medical): No  Physical Activity: Unknown (07/25/2024)   Exercise Vital Sign    Days of Exercise per Week: 4 days    Minutes of Exercise per Session: Patient declined  Stress: Stress Concern Present (07/25/2024)   [employer] of Occupational Health - Occupational Stress Questionnaire    Feeling of Stress: Rather much  Social Connections: Socially Isolated (07/25/2024)   Social Connection and Isolation Panel    Frequency of Communication with Friends and Family: More than three times a week    Frequency of Social Gatherings with Friends and Family: Never    Attends Religious Services: Never    Database Administrator or Organizations: No    Attends Engineer, Structural: Not on file    Marital Status: Separated  Intimate Partner Violence: Not on file  Depression (PHQ2-9): Low Risk (07/28/2024)   Depression (PHQ2-9)    PHQ-2 Score: 3  Alcohol Screen: Low Risk (09/05/2023)   Alcohol Screen    Last Alcohol Screening Score (AUDIT): 2  Housing: High Risk (07/25/2024)   Epic    Unable to Pay for  Housing in the Last Year: No    Number of Times Moved in the Last Year: 2    Homeless in the Last Year: No  Utilities: Low Risk (04/01/2023)   Received from [facility]   Utilities    In the past 12 months has the electric, gas, oil, or water company threatened to shut off services in your home? : No  Health Literacy: Not on file    Past Surgical History:  Procedure Laterality Date   APPENDECTOMY     CHOLECYSTECTOMY     FEMUR FRACTURE SURGERY Right 2006   FEMUR IM ROD REMOVAL Right 03/11/2016   TUBAL LIGATION      Family History  Problem Relation Age of Onset   Hypertension Mother  Hypertension Maternal Aunt    Hypertension Paternal Uncle    Hypertension Maternal Grandmother    Hypertension Maternal Grandfather    Healthy Son    Healthy Son    Healthy Son    Healthy Daughter    Healthy Daughter    Healthy Daughter     Allergies[1]  Medications Ordered Prior to Encounter[2]  BP 120/88 (BP Location: Left Arm, Patient Position: Sitting, Cuff Size: Large)   Pulse 88   Temp 98.4 F (36.9 C) (Oral)   Resp 16   Ht 4' 10 (1.473 m)   Wt 181 lb (82.1 kg)   SpO2 100%   BMI 37.83 kg/m        Objective:   Physical Exam  General Mental Status- Alert. General Appearance- Not in acute distress.   Skin General: Color- Normal Color. Moisture- Normal Moisture.  Neck  No JVD. Left side of neck/trap mild tender to palpation.  Chest and Lung Exam Auscultation: Breath Sounds:-CTA  Cardiovascular Auscultation:Rythm- rrr Murmurs & Other Heart Sounds:Auscultation of the heart reveals- No Murmurs.  Abdomen Inspection:-Inspeection Normal. Palpation/Percussion:Note:No mass. Palpation and Percussion of the abdomen reveal- Non Tender, Non Distended + BS, no rebound or guarding.    Neurologic Cranial Nerve exam:- CN III-XII intact(No nystagmus), symmetric smile. Drift Test:- No drift. Romberg Exam:- Negative.  Heal to Toe Gait exam:-Normal. Finger to Nose:-  Normal/Intact Strength:- 5/5 equal and symmetric strength both upper and lower extremities.       Assessment & Plan:  [phone]   Left-sided weakness and paresthesia intermittently weak over past 3 weeks. Last event sunday night. Lasted 15-20 minutes. Events are associated with both left side headache and neck pain. Often occurring almost nightly. Intermittent left-sided weakness and paresthesia, primarily when sitting or lying down. Differential includes cervical spine issues or TIA? - Ordered cervical spine x-ray. STAT - Ordered CT scan of the head. STAT - Advised emergency department visit if symptoms reoccur and persist. Extensive explained in light of upcoming long holiday and pending prior authorization.  Neck pain with headache Intermittent neck pain with unilateral headaches, possibly related to cervical spine issues. - Ordered cervical spine x-ray. - Ordered CT scan of the head.  Cognitive impairment and memory changes Memory issues with good mini mental status score. Differential includes low B12 or metabolic causes. - Ordered metabolic panel, CBC, TSH, T4, B1, and B12 levels. - Established baseline mini mental status. 28/30 today - Consider neurologist referral if symptoms worsen overtime  and  labs inconclusive.  Fatigue Chronic fatigue with some improvement in muscle and body aches. Possible relation to underlying conditions or medication side effects. - Ordered metabolic panel, CBC, TSH, T4, B1, and B12 levels.  Follow up date to be determined after lab and imaging review  Dallas Maxwell, PA-C   I personally spent a total of 45 minutes in the care of the patient today including performing a medically appropriate exam/evaluation, counseling and educating, placing orders, and documenting clinical information in the EHR.     [1]  Allergies Allergen Reactions   Tramadol  Shortness Of Breath and Nausea And Vomiting   Hydrocodone-Acetaminophen  Other (See Comments)     Sweats, heavy breathing   Prednisone  Other (See Comments)    Headache , nausea , abd pain   Acetaminophen -Codeine Nausea Only  [2]  Current Outpatient Medications on File Prior to Visit  Medication Sig Dispense Refill   albuterol  (VENTOLIN  HFA) 108 (90 Base) MCG/ACT inhaler INHALE 2 PUFFS INTO THE LUNGS EVERY 6 HOURS AS  NEEDED FOR WHEEZING OR SHORTNESS OF BREATH 18 g 0   ALPRAZolam  (XANAX ) 0.5 MG tablet 1 tab po30 minutes  prior to dental procedures for anxiety 2 tablet 0   amLODipine  (NORVASC ) 10 MG tablet Take 1 tablet (10 mg total) by mouth daily. 90 tablet 1   butalbital -aspirin -caffeine  (FIORINAL ) 50-325-40 MG capsule Take 1 capsule by mouth every 6 (six) hours as needed for headache. 12 capsule 0   fluticasone  (FLONASE ) 50 MCG/ACT nasal spray Place 2 sprays into both nostrils daily. (Patient taking differently: Place 2 sprays into both nostrils as needed.) 16 g 1   hydroxychloroquine  (PLAQUENIL ) 200 MG tablet Take 1 tablet (200 mg total) by mouth daily. 30 tablet 2   hydrOXYzine  (ATARAX ) 10 MG tablet Take 1 tablet (10 mg total) by mouth every 8 (eight) hours as needed. 30 tablet 0   latanoprost (XALATAN) 0.005 % ophthalmic solution 1 drop at bedtime.     losartan  (COZAAR ) 25 MG tablet Take 1 tablet by mouth once daily 90 tablet 1   meloxicam  (MOBIC ) 15 MG tablet Take 15 mg by mouth daily.     ondansetron  (ZOFRAN -ODT) 8 MG disintegrating tablet DISSOLVE 1 TABLET BY MOUTH EVERY 8 HOURS AS NEEDED FOR NAUSEA AND VOMITING 15 tablet 0   SUMAtriptan  (IMITREX ) 50 MG tablet TAKE 1 TABLET AT ONSET OF MIGRAINE MAY REPEAT IN 2 HOURS IF HEADACHE PERSISTS OR RECURS 10 tablet 2   tiZANidine  (ZANAFLEX ) 4 MG tablet Take 1 tablet (4 mg total) by mouth every 6 (six) hours as needed for muscle spasms. 30 tablet 0   Current Facility-Administered Medications on File Prior to Visit  Medication Dose Route Frequency Provider Last Rate Last Admin   dexamethasone  (DECADRON ) injection 4 mg  4 mg Intra-articular  Once        regadenoson  (LEXISCAN ) injection SOLN 0.4 mg  0.4 mg Intravenous Once O'Neal,  Thomas, MD       triamcinolone  acetonide (KENALOG ) 10 MG/ML injection 2.5 mg  2.5 mg Intra-articular Once        "

## 2024-08-30 ENCOUNTER — Ambulatory Visit (HOSPITAL_BASED_OUTPATIENT_CLINIC_OR_DEPARTMENT_OTHER)
Admission: RE | Admit: 2024-08-30 | Discharge: 2024-08-30 | Disposition: A | Discharge status: Home / Self Care | Source: Ambulatory Visit | Attending: Medical | Admitting: Medical

## 2024-08-30 ENCOUNTER — Ambulatory Visit: Payer: Self-pay | Admitting: Medical

## 2024-08-30 DIAGNOSIS — R202 Paresthesia of skin: Secondary | ICD-10-CM | POA: Insufficient documentation

## 2024-08-30 DIAGNOSIS — R29898 Other symptoms and signs involving the musculoskeletal system: Secondary | ICD-10-CM | POA: Insufficient documentation

## 2024-08-30 DIAGNOSIS — G4459 Other complicated headache syndrome: Secondary | ICD-10-CM | POA: Insufficient documentation

## 2024-08-30 MED ORDER — MELOXICAM 7.5 MG PO TABS: ORAL_TABLET | ORAL | 0 refills | Status: DC

## 2024-08-30 NOTE — Addendum Note (Signed)
 Addended by: DORINA DALLAS HERO on: 08/30/2024 12:30 PM   Modules accepted: Orders

## 2024-09-01 LAB — VITAMIN B1: Vitamin B1 (Thiamine): <6 nmol/L — ABNORMAL LOW (ref 8–30)

## 2024-09-01 NOTE — Telephone Encounter (Signed)
 Called pt and she wanted to disregard the message she sent she does understand pcp advice to take meloxicam  instead of medrol 

## 2024-09-05 ENCOUNTER — Other Ambulatory Visit: Payer: Self-pay | Admitting: Physician Assistant

## 2024-09-05 NOTE — Progress Notes (Signed)
 Last read by Roma Lis at 8:43AM on 09/02/2024.

## 2024-09-06 NOTE — Telephone Encounter (Signed)
 Last Fill: 06/09/2024  Eye exam:  06/07/2024 Normal   Labs: 08/29/2024 CBC and CMP WNL  Next Visit: 10/27/2023  Last Visit: 07/24/2024  DX: Positive ANA    Current Dose per office note 07/24/2024: Plaquenil  200 mg 1 tablet by mouth daily   Okay to refill Plaquenil ?

## 2024-09-12 ENCOUNTER — Encounter: Payer: Self-pay | Admitting: Medical

## 2024-09-13 ENCOUNTER — Ambulatory Visit: Admitting: Cardiology

## 2024-09-13 ENCOUNTER — Encounter: Payer: Self-pay | Admitting: Cardiology

## 2024-09-14 ENCOUNTER — Ambulatory Visit: Admitting: Medical

## 2024-09-14 ENCOUNTER — Encounter: Payer: Self-pay | Admitting: Medical

## 2024-09-14 VITALS — BP 137/86 | HR 98 | Temp 99.5°F | Resp 15 | Ht <= 58 in | Wt 181.2 lb

## 2024-09-14 DIAGNOSIS — J019 Acute sinusitis, unspecified: Secondary | ICD-10-CM

## 2024-09-14 DIAGNOSIS — J40 Bronchitis, not specified as acute or chronic: Secondary | ICD-10-CM | POA: Diagnosis not present

## 2024-09-14 DIAGNOSIS — R52 Pain, unspecified: Secondary | ICD-10-CM

## 2024-09-14 LAB — POCT INFLUENZA A/B
Influenza A, POC: NEGATIVE
Influenza B, POC: NEGATIVE

## 2024-09-14 MED ORDER — AZITHROMYCIN 250 MG PO TABS: ORAL_TABLET | ORAL | 0 refills | Status: AC

## 2024-09-14 MED ORDER — BENZONATATE 100 MG PO CAPS: 100.0000 mg | ORAL_CAPSULE | Freq: Three times a day (TID) | ORAL | 0 refills | Status: AC | PRN

## 2024-09-14 NOTE — Progress Notes (Signed)
 "  Subjective:    Patient ID: Phyllis Adams, female    DOB: [birth year], 49 y.o.   MRN: [id number]  HPI    Phyllis Adams is a 49 year old female who presents with body aches, chest congestion  and cough.   Pt has baseline chronic bodyaches and some joint pain from lupus.  For about a week she has had worsened body aches above her usual baseline after she got nasasl congestion and had productive cough(dark green sputum). Symptoms began after close contact with her granddaughter who was diagnosed with flu about a week ago. She has tried over-the-counter cough medicine without relief. She has not tested for flu or COVID. She presents 7 days post onset of symptoms but still wants flu test done. It came back negative.  She is taking meloxicam  for myalgias and arthralgias. She is allergic to hydrocodone and has had significant side effects from steroids in the past, which limits her use for treatment today.          Review of Systems  Constitutional:  Negative for chills and fever.  HENT:  Positive for congestion, sinus pressure and sinus pain. Negative for dental problem.   Respiratory:  Positive for cough. Negative for chest tightness, shortness of breath and wheezing.   Cardiovascular:  Negative for chest pain and palpitations.  Gastrointestinal:  Negative for abdominal pain, constipation, nausea and vomiting.  Genitourinary:  Negative for dysuria.  Musculoskeletal:  Positive for myalgias. Negative for neck pain.  Skin:  Negative for rash.  Neurological:  Negative for dizziness, speech difficulty, weakness and headaches.  Hematological:  Negative for adenopathy.  Psychiatric/Behavioral:  Negative for behavioral problems and hallucinations.     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   Angina pectoris 07/15/2022   Cardiac murmur 07/15/2022   Cigarette smoker 07/15/2022   Depression    Femur fracture  (HCC) 2006   Hypertension    Lumbar radiculopathy 01/16/2016   Marijuana use 01/29/2023   Migraine    Mood disorder    Obesity (BMI 30.0-34.9) 07/15/2022   Osteoarthritis of spine with radiculopathy, 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   Primary osteoarthritis of both feet 08/12/2023   Primary osteoarthritis of both hands 08/12/2023   Clinical and radiographic findings were suggestive of osteoarthritis.  No synovitis was noted.     Retained orthopedic hardware 01/16/2016   Segmental and somatic dysfunction of 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   Spasm of muscle 04/15/2016     Social History   Socioeconomic History   Marital status: Married    Spouse name: Not on file   Number of children: Not on file   Years of education: Not on file   Highest education level: Bachelor's degree (e.g., BA, AB, BS)  Occupational History   Not on file  Tobacco Use   Smoking status: Some Days    Types: Cigars, Cigarettes    Passive exposure: Never   Smokeless tobacco: Never   Tobacco comments:    3 cigars with marijuana    Smokes cigarettes when driving  Vaping Use   Vaping status: Never Used  Substance and Sexual Activity   Alcohol use: No   Drug use: Yes    Types: Marijuana    Comment: occ   Sexual activity: Yes    Birth control/protection: Other-see comments, Surgical  Other Topics Concern   Not on file  Social History Narrative   Are you right handed or left  handed? Right Handed   Are you currently employed ? 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 No    Who lives with you? Kids - adult age    What type of home do you live in: 1 story or 2 story? Lives in a one story home        Social Drivers of Health   Tobacco Use: High Risk (09/14/2024)   Patient History    Smoking Tobacco Use: Some Days    Smokeless Tobacco Use: Never    Passive Exposure: Never  Financial Resource Strain: Medium Risk (07/25/2024)   Overall Financial Resource Strain (CARDIA)     Difficulty of Paying Living Expenses: Somewhat hard  Food Insecurity: Food Insecurity Present (07/25/2024)   Epic    Worried About Programme Researcher, Broadcasting/film/video in the Last Year: Often true    Ran Out of Food in the Last Year: Often true  Transportation Needs: No Transportation Needs (07/25/2024)   Epic    Lack of Transportation (Medical): No    Lack of Transportation (Non-Medical): No  Physical Activity: Unknown (07/25/2024)   Exercise Vital Sign    Days of Exercise per Week: 4 days    Minutes of Exercise per Session: Patient declined  Stress: Stress Concern Present (07/25/2024)   [employer] of Occupational Health - Occupational Stress Questionnaire    Feeling of Stress: Rather much  Social Connections: Socially Isolated (07/25/2024)   Social Connection and Isolation Panel    Frequency of Communication with Friends and Family: More than three times a week    Frequency of Social Gatherings with Friends and Family: Never    Attends Religious Services: Never    Database Administrator or Organizations: No    Attends Engineer, Structural: Not on file    Marital Status: Separated  Intimate Partner Violence: Not on file  Depression (PHQ2-9): Low Risk (07/28/2024)   Depression (PHQ2-9)    PHQ-2 Score: 3  Alcohol Screen: Low Risk (09/05/2023)   Alcohol Screen    Last Alcohol Screening Score (AUDIT): 2  Housing: High Risk (07/25/2024)   Epic    Unable to Pay for Housing in the Last Year: No    Number of Times Moved in the Last Year: 2    Homeless in the Last Year: No  Utilities: Low Risk (04/01/2023)   Received from [facility]   Utilities    In the past 12 months has the electric, gas, oil, or water company threatened to shut off services in your home? : No  Health Literacy: Not on file    Past Surgical History:  Procedure Laterality Date   APPENDECTOMY     CHOLECYSTECTOMY     FEMUR FRACTURE SURGERY Right 2006   FEMUR IM ROD REMOVAL Right 03/11/2016   TUBAL LIGATION       Family History  Problem Relation Age of Onset   Hypertension Mother    Hypertension Maternal Aunt    Hypertension Paternal Uncle    Hypertension Maternal Grandmother    Hypertension Maternal Grandfather    Healthy Son    Healthy Son    Healthy Son    Healthy Daughter    Healthy Daughter    Healthy Daughter     Allergies[1]  Medications Ordered Prior to Encounter[2]  BP 137/86   Pulse 98   Temp 99.5 F (37.5 C) (Oral)   Resp 15   Ht 4' 10 (1.473  m)   Wt 181 lb 3.2 oz (82.2 kg)   LMP 03/07/2021   SpO2 99%   BMI 37.87 kg/m         Objective:   Physical Exam   General Mental Status- Alert. General Appearance- Not in acute distress.   Skin General: Color- Normal Color. Moisture- Normal Moisture.  Neck  No JVD.  Chest and Lung Exam Auscultation: Breath Sounds:-CTA  Cardiovascular Auscultation:Rythm- Regular. Murmurs & Other Heart Sounds:Auscultation of the heart reveals- No Murmurs.  Abdomen Inspection:-Inspeection Normal. Palpation/Percussion:Note:No mass. Palpation and Percussion of the abdomen reveal- Non Tender, Non Distended + BS, no rebound or guarding.    Neurologic Cranial Nerve exam:- CN III-XII intact(No nystagmus), symmetric smile. Strength:- 5/5 equal and symmetric strength both upper and lower extremities.    Heent- frontal and maxillary sinus pressure. Canals clear and normal tms.    Assessment & Plan:   Bronchitis and acute sinusitis Likely secondary to viral syndrome, possibly influenza. Differential includes sinus infection and bronchitis. - Prescribed azithromycin  for bronchitis and sinusitis. - Prescribed benzonatate  for cough.  Myalgias and arthralgias secondary to probable viral illness Exacerbated by probable viral illness, possibly influenza. Symptoms persist above baseline. - Instructed to double meloxicam  dose to 15 mg daily. - Instructed to monitor blood pressure due to increased meloxicam  dose.  Follow up   7-10 days or sooner if needed  [employer], PA-C     [1]  Allergies Allergen Reactions   Tramadol  Shortness Of Breath and Nausea And Vomiting   Hydrocodone-Acetaminophen  Other (See Comments)    Sweats, heavy breathing   Prednisone  Other (See Comments)    Headache , nausea , abd pain   Acetaminophen -Codeine Nausea Only  [2]  Current Outpatient Medications on File Prior to Visit  Medication Sig Dispense Refill   albuterol  (VENTOLIN  HFA) 108 (90 Base) MCG/ACT inhaler INHALE 2 PUFFS INTO THE LUNGS EVERY 6 HOURS AS NEEDED FOR WHEEZING OR SHORTNESS OF BREATH 18 g 0   ALPRAZolam  (XANAX ) 0.5 MG tablet 1 tab po30 minutes  prior to dental procedures for anxiety 2 tablet 0   amLODipine  (NORVASC ) 10 MG tablet Take 1 tablet (10 mg total) by mouth daily. 90 tablet 1   butalbital -aspirin -caffeine  (FIORINAL ) 50-325-40 MG capsule Take 1 capsule by mouth every 6 (six) hours as needed for headache. 12 capsule 0   fluticasone  (FLONASE ) 50 MCG/ACT nasal spray Place 2 sprays into both nostrils daily. (Patient taking differently: Place 2 sprays into both nostrils as needed.) 16 g 1   hydroxychloroquine  (PLAQUENIL ) 200 MG tablet Take 1 tablet by mouth once daily 30 tablet 2   hydrOXYzine  (ATARAX ) 10 MG tablet Take 1 tablet (10 mg total) by mouth every 8 (eight) hours as needed. 30 tablet 0   latanoprost (XALATAN) 0.005 % ophthalmic solution 1 drop at bedtime.     losartan  (COZAAR ) 25 MG tablet Take 1 tablet by mouth once daily 90 tablet 1   meloxicam  (MOBIC ) 7.5 MG tablet 1-2 tab po q day 14 tablet 0   ondansetron  (ZOFRAN -ODT) 8 MG disintegrating tablet DISSOLVE 1 TABLET BY MOUTH EVERY 8 HOURS AS NEEDED FOR NAUSEA AND VOMITING 15 tablet 0   SUMAtriptan  (IMITREX ) 50 MG tablet TAKE 1 TABLET AT ONSET OF MIGRAINE MAY REPEAT IN 2 HOURS IF HEADACHE PERSISTS OR RECURS 10 tablet 2   tiZANidine  (ZANAFLEX ) 4 MG tablet Take 1 tablet (4 mg total) by mouth every 6 (six) hours as needed for muscle spasms. 30 tablet 0    Current Facility-Administered  Medications on File Prior to Visit  Medication Dose Route Frequency Provider Last Rate Last Admin   dexamethasone  (DECADRON ) injection 4 mg  4 mg Intra-articular Once        regadenoson  (LEXISCAN ) injection SOLN 0.4 mg  0.4 mg Intravenous Once O'Neal, Tavistock Thomas, MD       triamcinolone  acetonide (KENALOG ) 10 MG/ML injection 2.5 mg  2.5 mg Intra-articular Once        "

## 2024-09-14 NOTE — Patient Instructions (Signed)
 Bronchitis and acute sinusitis Likely secondary to viral syndrome, possibly influenza. Differential includes sinus infection and bronchitis. - Prescribed azithromycin  for bronchitis and sinusitis. - Prescribed benzonatate  for cough.  Myalgias and arthralgias secondary to probable viral illness Exacerbated by probable viral illness, possibly influenza. Symptoms persist above baseline. - Instructed to double meloxicam  dose to 15 mg daily. - Instructed to monitor blood pressure due to increased meloxicam  dose.  Follow up  7-10 days or sooner if needed

## 2024-09-29 ENCOUNTER — Ambulatory Visit

## 2024-10-06 ENCOUNTER — Other Ambulatory Visit: Payer: Self-pay | Admitting: Medical

## 2024-10-06 ENCOUNTER — Encounter: Payer: Self-pay | Admitting: Medical

## 2024-10-09 MED ORDER — IBUPROFEN 800 MG PO TABS: 800.0000 mg | ORAL_TABLET | Freq: Three times a day (TID) | ORAL | 0 refills | Status: AC | PRN

## 2024-10-09 MED ORDER — IBUPROFEN 600 MG PO TABS: ORAL_TABLET | ORAL | 0 refills | Status: DC

## 2024-10-12 NOTE — Progress Notes (Unsigned)
 "  Office Visit Note  Patient: Phyllis Adams             Date of Birth: [birth year]           MRN: [id number]             PCP: Dorina Loving, PA-C Referring: Dorina Loving RIGGERS Visit Date: 10/26/2024 Occupation: Data Unavailable  Subjective:  No chief complaint on file.   History of Present Illness: Phyllis Adams is a 49 y.o. female ***     Activities of Daily Living:  Patient reports morning stiffness for *** {minute/hour:19697}.   Patient {ACTIONS;DENIES/REPORTS:21021675::Denies} nocturnal pain.  Difficulty dressing/grooming: {ACTIONS;DENIES/REPORTS:21021675::Denies} Difficulty climbing stairs: {ACTIONS;DENIES/REPORTS:21021675::Denies} Difficulty getting out of chair: {ACTIONS;DENIES/REPORTS:21021675::Denies} Difficulty using hands for taps, buttons, cutlery, and/or writing: {ACTIONS;DENIES/REPORTS:21021675::Denies}  No Rheumatology ROS completed.   PMFS History:  Patient Active Problem List   Diagnosis Date Noted   Primary osteoarthritis of both feet 08/12/2023   Primary osteoarthritis of both hands 08/12/2023   Marijuana use 01/29/2023   Angina pectoris 07/15/2022   Cardiac murmur 07/15/2022   Obesity (BMI 30.0-34.9) 07/15/2022   Cigarette smoker 07/15/2022   Abdominal bruit 07/15/2022   Depression 07/02/2022   Hypertension 07/02/2022   Migraine 07/02/2022   Mood disorder 07/02/2022   Adhesive capsulitis of right shoulder 03/26/2021   Spasm of muscle 04/15/2016   Osteoarthritis of spine with radiculopathy, 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   Segmental and somatic dysfunction of 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   Lumbar radiculopathy 01/16/2016   Retained orthopedic hardware 01/16/2016   Femur fracture (HCC) 2006    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   Angina pectoris 07/15/2022   Cardiac murmur 07/15/2022   Cigarette smoker 07/15/2022   Depression    Femur fracture (HCC) 2006    Hypertension    Lumbar radiculopathy 01/16/2016   Marijuana use 01/29/2023   Migraine    Mood disorder    Obesity (BMI 30.0-34.9) 07/15/2022   Osteoarthritis of spine with radiculopathy, 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   Primary osteoarthritis of both feet 08/12/2023   Primary osteoarthritis of both hands 08/12/2023   Clinical and radiographic findings were suggestive of osteoarthritis.  No synovitis was noted.     Retained orthopedic hardware 01/16/2016   Segmental and somatic dysfunction of 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   Spasm of muscle 04/15/2016    Family History  Problem Relation Age of Onset   Hypertension Mother    Hypertension Maternal Aunt    Hypertension Paternal Uncle    Hypertension Maternal Grandmother    Hypertension Maternal Grandfather    Healthy Son    Healthy Son    Healthy Son    Healthy Daughter    Healthy Daughter    Healthy Daughter    Past Surgical History:  Procedure Laterality Date   APPENDECTOMY     CHOLECYSTECTOMY     FEMUR FRACTURE SURGERY Right 2006   FEMUR IM ROD REMOVAL Right 03/11/2016   TUBAL LIGATION     Social History[1] Social History   Social History Narrative   Are you right handed or left handed? Right Handed   Are you currently employed ? 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 No    Who lives with you? Kids - adult age    What type of home do you live in: 1 story or 2 story? Lives in a one story home  Immunization History  Administered Date(s) Administered   PPD Test 09/29/2017     Objective: Vital Signs: LMP 03/07/2021    Physical Exam   Musculoskeletal Exam: ***  CDAI Exam: CDAI Score: -- Patient Global: --; Provider Global: -- Swollen: --; Tender: -- Joint Exam 10/26/2024   No joint exam has been documented for this visit   There is currently no information documented on the homunculus. Go to the Rheumatology activity and complete the homunculus joint  exam.  Investigation: No additional findings.  Imaging: No results found.  Recent Labs: Lab Results  Component Value Date   WBC 6.4 08/29/2024   HGB 12.8 08/29/2024   PLT 222.0 08/29/2024   NA 140 08/29/2024   K 3.9 08/29/2024   CL 105 08/29/2024   CO2 28 08/29/2024   GLUCOSE 70 08/29/2024   BUN 10 08/29/2024   CREATININE 0.70 08/29/2024   BILITOT 0.3 08/29/2024   ALKPHOS 95 08/29/2024   AST 14 08/29/2024   ALT 10 08/29/2024   PROT 6.9 08/29/2024   ALBUMIN 4.0 08/29/2024   CALCIUM 9.5 08/29/2024   GFRAA >60 06/13/2017   QFTBGOLDPLUS NEGATIVE 04/29/2022    Speciality Comments: PLQ Eye Exam: 06/07/2024 Normal @[facility] f/u 1 year  Procedures:  No procedures performed Allergies: Tramadol , Hydrocodone-acetaminophen , Prednisone , and Acetaminophen -codeine   Assessment / Plan:     Visit Diagnoses: Positive ANA (antinuclear antibody)  High risk medication use  Pain in both hands  Polyarthralgia  Elevated sed rate  Adhesive capsulitis of right shoulder  Paresthesias in left hand  Primary osteoarthritis of both hands  Primary osteoarthritis of both feet  History of femur fracture  Chronic pain of right knee  Lumbar spondylosis  Fibromyalgia  Other fatigue  Primary insomnia  Primary hypertension  Anxiety and depression  History of migraine  Cigarette smoker  Marijuana use  Angina pectoris  Orders: No orders of the defined types were placed in this encounter.  No orders of the defined types were placed in this encounter.   Face-to-face time spent with patient was *** minutes. Greater than 50% of time was spent in counseling and coordination of care.  Follow-Up Instructions: No follow-ups on file.   Waddell CHRISTELLA Craze, PA-C  Note - This record has been created using Dragon software.  Chart creation errors have been sought, but may not always  have been located. Such creation errors do not reflect on  the standard of medical  care.     [1]  Social History Tobacco Use   Smoking status: Some Days    Types: Cigars, Cigarettes    Passive exposure: Never   Smokeless tobacco: Never   Tobacco comments:    3 cigars with marijuana    Smokes cigarettes when driving  Vaping Use   Vaping status: Never Used  Substance Use Topics   Alcohol use: No   Drug use: Yes    Types: Marijuana    Comment: occ   "

## 2024-10-26 ENCOUNTER — Ambulatory Visit: Admitting: Physician Assistant

## 2024-10-26 DIAGNOSIS — F1290 Cannabis use, unspecified, uncomplicated: Secondary | ICD-10-CM

## 2024-10-26 DIAGNOSIS — M19041 Primary osteoarthritis, right hand: Secondary | ICD-10-CM

## 2024-10-26 DIAGNOSIS — M19071 Primary osteoarthritis, right ankle and foot: Secondary | ICD-10-CM

## 2024-10-26 DIAGNOSIS — R7689 Other specified abnormal immunological findings in serum: Secondary | ICD-10-CM

## 2024-10-26 DIAGNOSIS — R202 Paresthesia of skin: Secondary | ICD-10-CM

## 2024-10-26 DIAGNOSIS — I209 Angina pectoris, unspecified: Secondary | ICD-10-CM

## 2024-10-26 DIAGNOSIS — Z8781 Personal history of (healed) traumatic fracture: Secondary | ICD-10-CM

## 2024-10-26 DIAGNOSIS — M79641 Pain in right hand: Secondary | ICD-10-CM

## 2024-10-26 DIAGNOSIS — R700 Elevated erythrocyte sedimentation rate: Secondary | ICD-10-CM

## 2024-10-26 DIAGNOSIS — I10 Essential (primary) hypertension: Secondary | ICD-10-CM

## 2024-10-26 DIAGNOSIS — Z8669 Personal history of other diseases of the nervous system and sense organs: Secondary | ICD-10-CM

## 2024-10-26 DIAGNOSIS — M2550 Pain in unspecified joint: Secondary | ICD-10-CM

## 2024-10-26 DIAGNOSIS — G8929 Other chronic pain: Secondary | ICD-10-CM

## 2024-10-26 DIAGNOSIS — F5101 Primary insomnia: Secondary | ICD-10-CM

## 2024-10-26 DIAGNOSIS — F17210 Nicotine dependence, cigarettes, uncomplicated: Secondary | ICD-10-CM

## 2024-10-26 DIAGNOSIS — F419 Anxiety disorder, unspecified: Secondary | ICD-10-CM

## 2024-10-26 DIAGNOSIS — M7501 Adhesive capsulitis of right shoulder: Secondary | ICD-10-CM

## 2024-10-26 DIAGNOSIS — Z79899 Other long term (current) drug therapy: Secondary | ICD-10-CM

## 2024-10-26 DIAGNOSIS — M797 Fibromyalgia: Secondary | ICD-10-CM

## 2024-10-26 DIAGNOSIS — R5383 Other fatigue: Secondary | ICD-10-CM

## 2024-10-26 DIAGNOSIS — M47816 Spondylosis without myelopathy or radiculopathy, lumbar region: Secondary | ICD-10-CM

## 2024-11-23 ENCOUNTER — Ambulatory Visit
